# Patient Record
Sex: Female | Born: 1935 | Race: White | Hispanic: No | Smoking: Former smoker
Health system: Southern US, Community
[De-identification: ages and names within clinical notes are randomized; demographics above are authoritative.]

## PROBLEM LIST (undated history)

## (undated) DIAGNOSIS — R54 Age-related physical debility: Secondary | ICD-10-CM

## (undated) DIAGNOSIS — F0390 Unspecified dementia without behavioral disturbance: Secondary | ICD-10-CM

## (undated) DIAGNOSIS — I700 Atherosclerosis of aorta: Secondary | ICD-10-CM

## (undated) DIAGNOSIS — Z87898 Personal history of other specified conditions: Secondary | ICD-10-CM

## (undated) DIAGNOSIS — S065X9A Traumatic subdural hemorrhage with loss of consciousness of unspecified duration, initial encounter: Secondary | ICD-10-CM

## (undated) DIAGNOSIS — Z8659 Personal history of other mental and behavioral disorders: Secondary | ICD-10-CM

## (undated) DIAGNOSIS — I10 Essential (primary) hypertension: Secondary | ICD-10-CM

## (undated) DIAGNOSIS — R630 Anorexia: Secondary | ICD-10-CM

## (undated) DIAGNOSIS — E785 Hyperlipidemia, unspecified: Secondary | ICD-10-CM

## (undated) DIAGNOSIS — S0181XA Laceration without foreign body of other part of head, initial encounter: Secondary | ICD-10-CM

## (undated) DIAGNOSIS — N390 Urinary tract infection, site not specified: Secondary | ICD-10-CM

## (undated) DIAGNOSIS — S52502A Unspecified fracture of the lower end of left radius, initial encounter for closed fracture: Secondary | ICD-10-CM

## (undated) DIAGNOSIS — F419 Anxiety disorder, unspecified: Secondary | ICD-10-CM

## (undated) DIAGNOSIS — S022XXA Fracture of nasal bones, initial encounter for closed fracture: Secondary | ICD-10-CM

## (undated) DIAGNOSIS — S065XAA Traumatic subdural hemorrhage with loss of consciousness status unknown, initial encounter: Secondary | ICD-10-CM

## (undated) DIAGNOSIS — I639 Cerebral infarction, unspecified: Secondary | ICD-10-CM

## (undated) DIAGNOSIS — R410 Disorientation, unspecified: Secondary | ICD-10-CM

## (undated) HISTORY — DX: Fracture of nasal bones, initial encounter for closed fracture: S02.2XXA

## (undated) HISTORY — DX: Personal history of other specified conditions: Z87.898

## (undated) HISTORY — PX: HYSTEROTOMY: SHX1776

## (undated) HISTORY — DX: Hyperlipidemia, unspecified: E78.5

## (undated) HISTORY — DX: Laceration without foreign body of other part of head, initial encounter: S01.81XA

## (undated) HISTORY — DX: Unspecified fracture of the lower end of left radius, initial encounter for closed fracture: S52.502A

## (undated) HISTORY — DX: Essential (primary) hypertension: I10

## (undated) HISTORY — DX: Disorientation, unspecified: R41.0

## (undated) HISTORY — DX: Urinary tract infection, site not specified: N39.0

## (undated) HISTORY — PX: BREAST BIOPSY: SHX20

## (undated) HISTORY — DX: Anxiety disorder, unspecified: F41.9

## (undated) HISTORY — DX: Personal history of other mental and behavioral disorders: Z86.59

## (undated) HISTORY — DX: Anorexia: R63.0

## (undated) HISTORY — DX: Cerebral infarction, unspecified: I63.9

---

## 2015-03-08 ENCOUNTER — Ambulatory Visit (INDEPENDENT_AMBULATORY_CARE_PROVIDER_SITE_OTHER): Payer: Commercial Managed Care - HMO | Admitting: Family Medicine

## 2015-03-08 ENCOUNTER — Encounter: Payer: Self-pay | Admitting: Family Medicine

## 2015-03-08 VITALS — BP 160/98 | HR 101 | Temp 98.6°F | Ht 66.0 in | Wt 154.0 lb

## 2015-03-08 DIAGNOSIS — Z90710 Acquired absence of both cervix and uterus: Secondary | ICD-10-CM

## 2015-03-08 DIAGNOSIS — F1520 Other stimulant dependence, uncomplicated: Secondary | ICD-10-CM

## 2015-03-08 DIAGNOSIS — Z0000 Encounter for general adult medical examination without abnormal findings: Secondary | ICD-10-CM | POA: Insufficient documentation

## 2015-03-08 DIAGNOSIS — G459 Transient cerebral ischemic attack, unspecified: Secondary | ICD-10-CM

## 2015-03-08 LAB — POCT GLYCOSYLATED HEMOGLOBIN (HGB A1C): Hemoglobin A1C: 5.7
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
## 2015-03-08 NOTE — Patient Instructions (Signed)
It was a pleasure seeing you today in our clinic. Today we discussed establishment of care. Here is the treatment plan we have discussed and agreed upon together:  - I would like to see you back in clinic in 3-4 weeks. At that time we will go over your lab results taken today and will address any abnormalities we see. - I would like you to take your blood pressure at your local pharmacy (a public machine). If you can write the resulted blood pressure down for me at our next visit that would be very helpful!  - It was great meeting you today!

## 2015-03-08 NOTE — Assessment & Plan Note (Addendum)
Patient experiencing acute onset headaches after reducing the amount of caffeine consumption. Symptoms suggestive of caffeine dependence. Patient currently consumes a 2 L bottle of soda every 3 days. She also reports some infrequent consumption of coffee in the morning. At this time no further workup deemed necessary. I've counseled her on the safe practices of reducing soda intake. Less likely causes would be tension headaches, patient is not reporting any additional stress or depression at this time. Patient denies any aura or photosensitivity making migraine less likely.

## 2015-03-08 NOTE — Assessment & Plan Note (Addendum)
Patient has not received any care for a primary care provider in approximately 10 years. She is not currently on any medications at this time. Past medical history is minimal and no medical records have been supplied at this time.  Hx of TIA will need to be monitored, labs ordered for this reason: - BMP, CBC, A1c, lipid panel, TSH.

## 2015-03-09 LAB — CBC
HCT: 42.7 % (ref 36.0–46.0)
Hemoglobin: 14.3 g/dL (ref 12.0–15.0)
MCH: 28.4 pg (ref 26.0–34.0)
MCHC: 33.5 g/dL (ref 30.0–36.0)
MCV: 84.7 fL (ref 78.0–100.0)
MPV: 12 fL (ref 8.6–12.4)
PLATELETS: 287 10*3/uL (ref 150–400)
RBC: 5.04 MIL/uL (ref 3.87–5.11)
RDW: 14.8 % (ref 11.5–15.5)
WBC: 8.2 10*3/uL (ref 4.0–10.5)

## 2015-03-09 LAB — LIPID PANEL
CHOL/HDL RATIO: 4.8 ratio
Cholesterol: 239 mg/dL — ABNORMAL HIGH (ref 0–200)
HDL: 50 mg/dL (ref >=46)
LDL Cholesterol: 161 mg/dL — ABNORMAL HIGH (ref 0–99)
Triglycerides: 139 mg/dL (ref <150)
VLDL: 28 mg/dL (ref 0–40)

## 2015-03-09 LAB — TSH: TSH: 0.818 u[IU]/mL (ref 0.350–4.500)

## 2015-03-09 LAB — BASIC METABOLIC PANEL WITH GFR
BUN: 19 mg/dL (ref 6–23)
CO2: 23 meq/L (ref 19–32)
Calcium: 9.6 mg/dL (ref 8.4–10.5)
Chloride: 103 mEq/L (ref 96–112)
Creat: 0.49 mg/dL — ABNORMAL LOW (ref 0.50–1.10)
GFR, Est Non African American: >89 mL/min
GLUCOSE: 110 mg/dL — AB (ref 70–99)
Potassium: 4 mEq/L (ref 3.5–5.3)
Sodium: 138 mEq/L (ref 135–145)

## 2015-03-22 ENCOUNTER — Encounter: Payer: Self-pay | Admitting: Family Medicine

## 2015-03-29 ENCOUNTER — Encounter: Payer: Self-pay | Admitting: Family Medicine

## 2015-03-29 ENCOUNTER — Ambulatory Visit (INDEPENDENT_AMBULATORY_CARE_PROVIDER_SITE_OTHER): Payer: Commercial Managed Care - HMO | Admitting: Family Medicine

## 2015-03-29 VITALS — BP 130/100 | HR 118 | Temp 97.5°F | Ht 66.0 in | Wt 151.2 lb

## 2015-03-29 DIAGNOSIS — R030 Elevated blood-pressure reading, without diagnosis of hypertension: Secondary | ICD-10-CM | POA: Diagnosis not present

## 2015-03-29 DIAGNOSIS — E785 Hyperlipidemia, unspecified: Secondary | ICD-10-CM

## 2015-03-29 DIAGNOSIS — Z9149 Other personal history of psychological trauma, not elsewhere classified: Secondary | ICD-10-CM

## 2015-03-29 DIAGNOSIS — IMO0001 Reserved for inherently not codable concepts without codable children: Secondary | ICD-10-CM | POA: Insufficient documentation

## 2015-03-29 DIAGNOSIS — IMO0002 Reserved for concepts with insufficient information to code with codable children: Secondary | ICD-10-CM

## 2015-03-29 MED ORDER — ATORVASTATIN CALCIUM 40 MG PO TABS: 40.0000 mg | ORAL_TABLET | Freq: Every day | ORAL | Status: DC
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
## 2015-03-29 NOTE — Assessment & Plan Note (Signed)
A: labs came back showing HLD. Patient has a self-reported h/o TIA. She was NOT on any medication for this. So I had a very low threshold to start treatment from the start. LDL goal of <100 due to hx and 7763yr cardiac risk >20%  P: Starting Atorvastatin 40mg  QD. Went through the side-effects to look for and what to do if she experiences any.  - f/u in 1-2 months requested.  Next visit: patient has had very little screening performed  - discussion of colonoscopy and mammogram should be considered.

## 2015-03-29 NOTE — Assessment & Plan Note (Signed)
A: patient very untrusting of the medical community. BP 177/113 initially; repeat was 130/100; according to pt. She had a BP of 124/83 in [facility] just last week. Patient gets very easily flustered/nervous. While testing strength in the physical exam she did not understand my request for hand placement and became so flustered and jittery I had to stop the exam, and take a few breaths with her.   P: I have asked patient to take multiple BPs at her pharmacy for me over the next week or 2. The BP she is reporting makes me very uneasy about treating her for HTN due to risks for becoming hypotensive -- also, pt's Cr was normal. I would not expect her kidneys to function so well if her typical BP was close to the 177/113 value we obtained today.  - Patient is to obtain BPs while in a comfortable environment.  - I will be treating BP if these values come back in the hypertensive range. - Regardless of her nervous appearance on the outside I believe patient is starting to trust me a bit. She was much more calm than last visit. And she was much more calm at the end of this visit than the beginning. Upon leaving she thanked me and stated "It's nice to know I don't have another bone-headed doctor" -- It's the little wins, right?   Next visit: consider EKG, for both BL and due to hx of TIA

## 2015-03-29 NOTE — Patient Instructions (Addendum)
It was a pleasure seeing you today in our clinic. Today we discussed your labs and cholesterol. Here is the treatment plan we have discussed and agreed upon together:   - I have written for a prescription of atorvastatin (Lipitor) 40mg . This is to be taken once a day.  - If you begin to experience any symptoms of muscle aches, skin rash/swelling, abdominal pain, nausea, vomiting, or yellowing of the eyes or skin please do not hesitate to call our office and discontinue the medication.  - Also, please gather 2-3 measurements of your blood pressure while at your local pharmacy. Your blood pressure is currently very high in our office, but as we discussed, it seems to be very well controlled during your daily activities. Lets keep an eye on this. Please let me know what blood pressure values you obtain at these outside sources.  - At our next visit I would like to discuss further screening in the future
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
## 2015-03-29 NOTE — Assessment & Plan Note (Addendum)
Hx of TIA. Hasn't been on any medications for many years. NEEDS TO BE MONITORED. Will likely need to start a statin.   - CBC, Lipid panel, TSH

## 2015-04-29 ENCOUNTER — Other Ambulatory Visit: Payer: Self-pay | Admitting: Family Medicine

## 2015-04-29 MED ORDER — ATORVASTATIN CALCIUM 40 MG PO TABS: 40.0000 mg | ORAL_TABLET | Freq: Every day | ORAL | Status: DC

## 2015-05-09 ENCOUNTER — Ambulatory Visit (INDEPENDENT_AMBULATORY_CARE_PROVIDER_SITE_OTHER): Payer: Commercial Managed Care - HMO | Admitting: Family Medicine

## 2015-05-09 VITALS — BP 145/90 | HR 85 | Temp 98.2°F | Ht 66.0 in | Wt 147.1 lb

## 2015-05-09 DIAGNOSIS — R030 Elevated blood-pressure reading, without diagnosis of hypertension: Secondary | ICD-10-CM

## 2015-05-09 DIAGNOSIS — E785 Hyperlipidemia, unspecified: Secondary | ICD-10-CM

## 2015-05-09 DIAGNOSIS — IMO0001 Reserved for inherently not codable concepts without codable children: Secondary | ICD-10-CM

## 2015-05-09 MED ORDER — ATORVASTATIN CALCIUM 40 MG PO TABS: 40.0000 mg | ORAL_TABLET | Freq: Every day | ORAL | Status: DC

## 2015-05-09 NOTE — Patient Instructions (Signed)
It was a pleasure seeing you today in our clinic. Today we discussed your new medication and high blood pressure. Here is the treatment plan we have discussed and agreed upon together:  - Thank you so much for taking your blood pressure every week since our last visit. These pressures look wonderful! We will definitely NOT be starting any medications for this at this time.  - Continue taking the Atorvastatin. This will likely be a "forever" medication, which we discussed during our visit.

## 2015-05-13 ENCOUNTER — Encounter: Payer: Self-pay | Admitting: Family Medicine

## 2015-05-13 NOTE — Assessment & Plan Note (Signed)
Clinic HTN appears to be solely "white coat" in nature. BPs taken at her local pharmacy is promising. We may need her to recheck this at a different site, in case this machine is giving incorrect low reads due to improper calibration (also, the precision is a bit concerning -- you would think she would have at least one BP that differs by 5-6 systolic). As for now, I believe patient has no need to be on antihypertensives.

## 2015-05-13 NOTE — Assessment & Plan Note (Signed)
Patient tolerating Statin therapy well. No complaints at this time. Will need to reassess lipid panel in future. - continue current Atorvastatin 40mg 

## 2015-05-13 NOTE — Progress Notes (Signed)
   HPI  CC: med and HTN f/u  # HLD:  Patient recently placed on statin  Patient reports tolerating this well >> takes everyday around breakfast  Has not had any adverse effects noticed by patient.  Asking about duration of medication, I informed her this would likely be lifelong.   Happy with how it is going ROS: denies HA, CP, dizziness, SOB, myalgias, fatigue, SOB, cramping, weakness, paresthesias.  # HTN, whitecoat  Patient also in today to recheck BP  Patient was asked to take BP at local pharmacy at least once a week between this visit and the previous.   Patient complied: 123/80 (4/27), 124/80, 123/82, 123/82, 124/82 (5/22)  BPs are MUCH better than the BPs we are getting here in the clinic.  ROS: no HA, dizziness, SOB, fatigue, CP, abdom pain, seizures, syncome  Review of Systems   See HPI for ROS. All other systems reviewed and are negative.  Past medical history and social history reviewed and updated in the EMR as appropriate.  Objective: BP 145/90 mmHg  Pulse 85  Temp(Src) 98.2 F (36.8 C) (Oral)  Ht 5\' 6"  (1.676 m)  Wt 147 lb 1.6 oz (66.724 kg)  BMI 23.75 kg/m2 Gen: NAD, alert, cooperative, and pleasant. Slightly anxious appearing HEENT: NCAT, EOMI, MMM CV: RRR, no murmur Resp: CTAB, no wheezes, non-labored Ext: No edema, warm Neuro: Alert and oriented, Speech clear, No gross deficits  Assessment and plan:  Hyperlipidemia LDL goal <100 Patient tolerating Statin therapy well. No complaints at this time. Will need to reassess lipid panel in future. - continue current Atorvastatin 40mg    White coat hypertension Clinic HTN appears to be solely "white coat" in nature. BPs taken at her local pharmacy is promising. We may need her to recheck this at a different site, in case this machine is giving incorrect low reads due to improper calibration (also, the precision is a bit concerning -- you would think she would have at least one BP that differs by 5-6  systolic). As for now, I believe patient has no need to be on antihypertensives.    Meds ordered this encounter  Medications  . atorvastatin (LIPITOR) 40 MG tablet    Sig: Take 1 tablet (40 mg total) by mouth daily.    Dispense:  90 tablet    Refill:  3     Kathee DeltonIan D Esker Dever, MD,MS,  PGY1 05/13/2015 6:59 PM

## 2015-10-06 ENCOUNTER — Encounter: Payer: Self-pay | Admitting: Family Medicine

## 2015-10-06 ENCOUNTER — Ambulatory Visit (INDEPENDENT_AMBULATORY_CARE_PROVIDER_SITE_OTHER): Payer: Commercial Managed Care - HMO | Admitting: Family Medicine

## 2015-10-06 VITALS — BP 140/92 | HR 78 | Temp 98.1°F | Ht 66.0 in | Wt 140.0 lb

## 2015-10-06 DIAGNOSIS — E785 Hyperlipidemia, unspecified: Secondary | ICD-10-CM

## 2015-10-06 MED ORDER — ASPIRIN EC 81 MG PO TBEC: 81.0000 mg | DELAYED_RELEASE_TABLET | Freq: Every day | ORAL | Status: DC

## 2015-10-06 NOTE — Assessment & Plan Note (Signed)
Patient is currently doing well on her current medication regimen. She did ask however if we would be able to decrease the dosing of her atorvastatin, not because of any side effects but because of curiosity. I informed her that due to her history of a TIA it would be ideal that she stay on statin therapy of at least this amount. We discussed the possibility of starting a regular baby aspirin to her medication regimen. She was willing to initiate this. - Continue atorvastatin 40 mg daily - Initiate Aspirin 81 mg daily. - Follow-up in 5-6 months for routine labs and check-up.

## 2015-10-06 NOTE — Progress Notes (Signed)
   HPI  CC: f/u Patient is here for a follow-up on her medication. She states that she has been very compliant with her current regimen of atorvastatin. She denies any side effects of muscle aches fatigue weakness cramping upset stomach nausea vomiting or diarrhea. Overall she states that she feels well and has no other complaints.  ROS: No headache or blurred vision insomnia fatigue shortness of breath chest pain or paresthesias.  Objective: BP 140/92 mmHg  Pulse 78  Temp(Src) 98.1 F (36.7 C) (Oral)  Ht 5\' 6"  (1.676 m)  Wt 140 lb (63.504 kg)  BMI 22.61 kg/m2 Gen: NAD, alert, cooperative, and pleasant. CV: RRR, no murmur Resp: CTAB, no wheezes, non-labored Neuro: Alert and oriented, Speech clear, No gross deficits  Assessment and plan:  Hyperlipidemia LDL goal <100 Patient is currently doing well on her current medication regimen. She did ask however if we would be able to decrease the dosing of her atorvastatin, not because of any side effects but because of curiosity. I informed her that due to her history of a TIA it would be ideal that she stay on statin therapy of at least this amount. We discussed the possibility of starting a regular baby aspirin to her medication regimen. She was willing to initiate this. - Continue atorvastatin 40 mg daily - Initiate Aspirin 81 mg daily. - Follow-up in 5-6 months for routine labs and check-up.   Meds ordered this encounter  Medications  . aspirin EC 81 MG tablet    Sig: Take 1 tablet (81 mg total) by mouth daily.    Dispense:  30 tablet    Refill:  11     Kathee DeltonIan D Nnenna Meador, MD,MS,  PGY2 10/06/2015 4:54 PM

## 2015-10-06 NOTE — Patient Instructions (Signed)
It was a pleasure seeing you today in our clinic. Today we discussed your medications. Here is the treatment plan we have discussed and agreed upon together:   - Keep up the great work. - Continue taking the atorvastatin once a day. - Do not hesitate to give our office a call if you have any questions or concerns. - I would like to see you back in about 5-6 months (end of March/early April) for your annual checkup and labs. - Have a great holiday season!

## 2015-10-11 ENCOUNTER — Telehealth: Payer: Self-pay | Admitting: Family Medicine

## 2015-10-11 NOTE — Telephone Encounter (Signed)
Spoke with patient, she states she feels the lipitor is making her more aggressive and shakey/nervous and she has stopped taking it. States she was going to bring this up to MD at last office visit but lost her nerve. She states she is still taking the aspirin. Would to speak with MD.

## 2015-10-11 NOTE — Telephone Encounter (Signed)
Was able to talk to patient over the phone. Patient feels as though she is experienced and side effects of anxiety and aggressiveness from her Lipitor. She decided to stop taking this medicine. At this time I think that patient's anxiety is playing a role, and her baseline fear of the healthcare community does not help. I discussed with her the possibility of starting an SSRI to help with his anxiety. Patient would like to speak with me in person. Appointment has been set up for Friday.  Patient would like to stop taking Lipitor and has asked if she is to go without taking this medicine until Friday. I informed her that although I prescribed this medication if she does not want to take this medication than I cannot force her to do so.

## 2015-10-11 NOTE — Telephone Encounter (Signed)
Pt called and would like to speak to Dr. Wende MottMcKeag about her visit last week. jw

## 2015-10-14 ENCOUNTER — Encounter: Payer: Self-pay | Admitting: Family Medicine

## 2015-10-14 ENCOUNTER — Ambulatory Visit (INDEPENDENT_AMBULATORY_CARE_PROVIDER_SITE_OTHER): Payer: Commercial Managed Care - HMO | Admitting: Family Medicine

## 2015-10-14 VITALS — BP 176/94 | HR 109 | Temp 98.6°F | Ht 66.0 in | Wt 141.9 lb

## 2015-10-14 DIAGNOSIS — F419 Anxiety disorder, unspecified: Secondary | ICD-10-CM

## 2015-10-14 DIAGNOSIS — E785 Hyperlipidemia, unspecified: Secondary | ICD-10-CM | POA: Diagnosis not present

## 2015-10-14 MED ORDER — CITALOPRAM HYDROBROMIDE 20 MG PO TABS: ORAL_TABLET | ORAL | Status: DC

## 2015-10-14 NOTE — Progress Notes (Signed)
   HPI  CC: medications and anxiety Patient is here today due to some issues with her medications. She states that she has been feeling increasingly panicky since initiating her atorvastatin. She recognizes that she had been extremely irritable with her neighbor which is unlike her. She also had a extremely panicked feeling while trying to escalate the curb at her grocery store. She has discontinued the atorvastatin at this time.  We have discussed initiating SSRI treatment at this time. Patient is very unsure resting of most/all medical management and medical care workers. We discussed some of the pros and cons of starting a treatment like this.   ROS: Denies fever chills nausea vomiting diarrhea muscle aches and joint pains abdominal pain chest pain shortness of breath. Endorses panicked feeling jitteriness irritability and anxiety  Objective: BP 176/94 mmHg  Pulse 109  Temp(Src) 98.6 F (37 C) (Oral)  Ht 5\' 6"  (1.676 m)  Wt 141 lb 14.4 oz (64.365 kg)  BMI 22.91 kg/m2 Gen: NAD, alert, anxious,  CV: RRR Resp: CTAB, no wheezes, non-labored Abd: SNTND Neuro: Alert and oriented, Speech clear, No gross deficits, anxious, broke into tears at one point due to her anxiety.  Assessment and plan:  Hyperlipidemia LDL goal <100 Patient feels as though the statin she is currently taking is causing significant anxiety. She has stopped taking this medicine b/c of this. To my knowledge Anxiety is not a common SA of this class of drugs. This is unlikely that this is a true SA from this statin.  - I will readdress this decision at a later date.  Anxiety Patient has shown a significant amount of anxiety toward the healthcare community in the past. This was one of the first times she has described this type of anxiety effecting her life outside of our clinic. Just from some of the conversations we have had in the past I feel that it is likely that she is typically fairly anxious and on edge outside our  clinic. Patient was relatively receptive to initiating an SSRI today as long as its not "a habit forming drug". - Celexa 20mg  x7 days, 40mg  after that - f/u in 4 weeks   Meds ordered this encounter  Medications  . citalopram (CELEXA) 20 MG tablet    Sig: Take one tablet (20mg ) every day for the first 7 days. Then take 2 tablets (40mg ) every day after that.    Dispense:  53 tablet    Refill:  0     Kathee DeltonIan D Lindsee Labarre, MD,MS,  PGY2 10/16/2015 7:12 PM

## 2015-10-14 NOTE — Patient Instructions (Signed)
It was a pleasure seeing you today in our clinic. Today we discussed her medications and anxiety. Here is the treatment plan we have discussed and agreed upon together:   - I have started you on citalopram. Take 1 tablet once a day for the first week. Then take 2 tablets once a day every day after that. - I like to see you back in one month to go over your response to this medication.

## 2015-10-16 DIAGNOSIS — F419 Anxiety disorder, unspecified: Secondary | ICD-10-CM | POA: Insufficient documentation

## 2015-10-16 NOTE — Assessment & Plan Note (Signed)
Patient has shown a significant amount of anxiety toward the healthcare community in the past. This was one of the first times she has described this type of anxiety effecting her life outside of our clinic. Just from some of the conversations we have had in the past I feel that it is likely that she is typically fairly anxious and on edge outside our clinic. Patient was relatively receptive to initiating an SSRI today as long as its not "a habit forming drug". - Celexa 20mg  x7 days, 40mg  after that - f/u in 4 weeks

## 2015-10-16 NOTE — Assessment & Plan Note (Signed)
Patient feels as though the statin she is currently taking is causing significant anxiety. She has stopped taking this medicine b/c of this. To my knowledge Anxiety is not a common SA of this class of drugs. This is unlikely that this is a true SA from this statin.  - I will readdress this decision at a later date.

## 2015-10-24 ENCOUNTER — Telehealth: Payer: Self-pay | Admitting: Family Medicine

## 2015-10-24 NOTE — Telephone Encounter (Signed)
Pt called and would like to speak to her doctor about her Celexa. Olivia Jacobsonjw

## 2015-10-25 NOTE — Telephone Encounter (Signed)
Return call to patient regarding reaction to Celexa.  Patient stated she took a dose this morning and felt short of breathe. Advise patient to stop taking until she hear a response from her doctor or nurse.  Patient denied any other symptoms.  Will forward to PCP.  Clovis PuMartin, Alashia Brownfield L, RN

## 2015-10-25 NOTE — Telephone Encounter (Signed)
Pt called because she thinks that she is having side effects from the Celexa. She is having trouble catching her breath. She is not going to take any more of the medication until she speaks to the doctor. Please call ASAP. jw

## 2015-10-26 NOTE — Telephone Encounter (Signed)
Pt wants to talk to dr and is upset that he hasnt called her back yet

## 2015-10-26 NOTE — Telephone Encounter (Signed)
Patient states she has not tolerated the 40mg  dosing well. Reports some "breathing problems" when taking it. I asked her to describe her breathing issues and she stated that it "probably wasn't the medication" but instead she got "worked up" and that was what caused it. I asked if thiscould be described as a panic attack and she said yes. She denies any SOB, cough, or wheeze. SOB only lasted until she calmed down from this panicked like state. She asked if 20mg  (one tablet) would be ok to take. I told her it was. She seemed relieved. She had no further questions.

## 2015-10-31 ENCOUNTER — Telehealth: Payer: Self-pay | Admitting: Family Medicine

## 2015-10-31 NOTE — Telephone Encounter (Signed)
FYI to PCP

## 2015-10-31 NOTE — Telephone Encounter (Signed)
Pt called and said that they are getting along fine with just 1 pill of the anxiety medication. jw

## 2015-11-16 ENCOUNTER — Other Ambulatory Visit: Payer: Self-pay | Admitting: Family Medicine

## 2015-11-16 ENCOUNTER — Telehealth: Payer: Self-pay | Admitting: Family Medicine

## 2015-11-16 MED ORDER — CITALOPRAM HYDROBROMIDE 20 MG PO TABS: 20.0000 mg | ORAL_TABLET | Freq: Every day | ORAL | Status: DC

## 2015-11-16 NOTE — Telephone Encounter (Signed)
Patient informed, expressed understanding. 

## 2015-11-16 NOTE — Telephone Encounter (Signed)
Pt called and needs a refill on her Lipitor and Celexa called in. jw

## 2015-11-16 NOTE — Telephone Encounter (Signed)
Celexa refilled. Lipitor SHOULD still has refills available.

## 2016-02-09 ENCOUNTER — Telehealth: Payer: Self-pay | Admitting: *Deleted

## 2016-02-09 NOTE — Telephone Encounter (Signed)
Called patient to offer flu vaccine.  Patient declined, stating she doesn't do any "voluntary innoculations." DUCATTE, Nickola Major, RN

## 2016-03-01 ENCOUNTER — Ambulatory Visit (INDEPENDENT_AMBULATORY_CARE_PROVIDER_SITE_OTHER): Payer: Commercial Managed Care - HMO | Admitting: Family Medicine

## 2016-03-01 ENCOUNTER — Encounter: Payer: Self-pay | Admitting: Family Medicine

## 2016-03-01 VITALS — BP 156/89 | HR 118 | Temp 98.1°F | Ht 66.0 in | Wt 128.0 lb

## 2016-03-01 DIAGNOSIS — R030 Elevated blood-pressure reading, without diagnosis of hypertension: Secondary | ICD-10-CM | POA: Diagnosis not present

## 2016-03-01 DIAGNOSIS — Z23 Encounter for immunization: Secondary | ICD-10-CM

## 2016-03-01 DIAGNOSIS — F419 Anxiety disorder, unspecified: Secondary | ICD-10-CM | POA: Diagnosis not present

## 2016-03-01 DIAGNOSIS — IMO0001 Reserved for inherently not codable concepts without codable children: Secondary | ICD-10-CM

## 2016-03-01 NOTE — Patient Instructions (Signed)
It was a pleasure seeing you today in our clinic. Today we discussed your medications and anxiety. Here is the treatment plan we have discussed and agreed upon together:   - I feel very good about where you are at this time with your medications. - Continue taking the atorvastatin for your cholesterol - Continue taking your Celexa for your anxiety - Continue taking your aspirin for your heart health - I would like to see you back in 6-9 months or sooner if necessary.
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
## 2016-03-01 NOTE — Assessment & Plan Note (Signed)
Stable: Patient continues to have hypertensive reads in the office. In the past patient was asked to bring in blood pressure values from her local pharmacy which she feels comfortable at. Those values were normotensive. Patient also has 1 previous normotensive reading which was taken at the end of a previous visit. - No medications deemed necessary at this time. We'll continue to monitor

## 2016-03-01 NOTE — Assessment & Plan Note (Addendum)
Improving: Patient endorses significant improvement on new medication regimen. She endorses her desire to continue with this regimen at this time. - No changes to her medication regimen at this time. - Today I offered our psychiatric services in this clinic. Patient was adamant that she was not interested in this at this time. However she stated her appreciation when informed that the services will always be available while she is a patient in this clinic. However, for now I do not believe patient has any plans to utilize this resource at this time.

## 2016-04-16 ENCOUNTER — Other Ambulatory Visit: Payer: Self-pay | Admitting: Family Medicine

## 2016-04-16 ENCOUNTER — Telehealth: Payer: Self-pay | Admitting: Family Medicine

## 2016-04-16 MED ORDER — CITALOPRAM HYDROBROMIDE 20 MG PO TABS: 20.0000 mg | ORAL_TABLET | Freq: Every day | ORAL | Status: DC

## 2016-04-16 NOTE — Telephone Encounter (Signed)
Called patient. Discussed her medications. She is very pleased w/ her current medication regimen. She does not have an appointment scheduled w/ me in the future. I asked that she make one at her convenience as she is due for some labs to be drawn.   She had no further questions.  Kathee DeltonIan D McKeag, MD,MS,  PGY2 04/16/2016 3:25 PM

## 2016-04-16 NOTE — Telephone Encounter (Signed)
Pt called because she needs a refill on her Celexa, and she had some additional questions for the doctor about the medication. jw

## 2016-06-01 ENCOUNTER — Ambulatory Visit (INDEPENDENT_AMBULATORY_CARE_PROVIDER_SITE_OTHER): Payer: Commercial Managed Care - HMO | Admitting: Family Medicine

## 2016-06-01 VITALS — BP 157/69 | HR 70 | Temp 98.1°F | Ht 66.0 in | Wt 130.6 lb

## 2016-06-01 DIAGNOSIS — R29818 Other symptoms and signs involving the nervous system: Secondary | ICD-10-CM | POA: Diagnosis not present

## 2016-06-01 DIAGNOSIS — Z79899 Other long term (current) drug therapy: Secondary | ICD-10-CM | POA: Diagnosis not present

## 2016-06-01 DIAGNOSIS — G252 Other specified forms of tremor: Secondary | ICD-10-CM

## 2016-06-01 DIAGNOSIS — E785 Hyperlipidemia, unspecified: Secondary | ICD-10-CM

## 2016-06-01 DIAGNOSIS — F4310 Post-traumatic stress disorder, unspecified: Secondary | ICD-10-CM
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
## 2016-06-01 NOTE — Assessment & Plan Note (Signed)
Patient appears to have some fairly significant neurological deficits on exam. Initial impression was that these seem to be due to her overwhelming anxiety. However, now that she is relatively comfortable in our office it is very much apparent that these deficits are real and have likely been present since her reported TIA she experienced in [place] prior to her moving to [place]. - My concern is with cerebellar deficits as well as possible development of multifocal vascular dementia. - Patient adamantly refuses to begin any additional medication therapy. She also refuses to see any specialists including neurology physical therapy or occupational therapy. - I will monitor this throughout our office visits. - Patient agrees to continue taking her daily aspirin, atorvastatin, and Celexa.

## 2016-06-01 NOTE — Assessment & Plan Note (Signed)
Patient is here complaining of a tremor which has been significantly affecting her handwriting. Physical exam yielded a baseline intention tremor. Etiology likely secondary to patient's history of stroke and age. There may be some anxiety component to this as well. Patient would like to know if there is anything she can do but was very adamant that she did not want any new medications and did not want to see any specialists or therapists for this issue - Because of patient's wishes and her ability to make her own decisions we will simply watch and monitor these symptoms. - Patient refused any medical management. - Patient refused any referral to neurology or occupational therapy.

## 2016-06-01 NOTE — Patient Instructions (Signed)
It was a pleasure seeing you today in our clinic. Today we discussed your tremor and medications. Here is the treatment plan we have discussed and agreed upon together:   - I think at this time you would benefit most by being seen by a neurologist. Unfortunately without your approval I cannot place this referral. I would like for you to consider this option in the future now be happy to make this referral at that time. - I believe your tremor likely has multiple causes. One would be from your previous stroke and the other from your age. We are currently doing what we can to reduce the risk of this getting any worse but without your approval to begin new medications or to see an occupational therapist there isn't too much more we can do. - We will draw labs today to make sure you are responding well to your medications. - I would like to see you back in 6 months.

## 2016-06-02 ENCOUNTER — Encounter: Payer: Self-pay | Admitting: Family Medicine

## 2016-06-02 LAB — BASIC METABOLIC PANEL WITH GFR
BUN: 29 mg/dL — AB (ref 7–25)
CO2: 22 mmol/L (ref 20–31)
Calcium: 9.5 mg/dL (ref 8.6–10.4)
Chloride: 101 mmol/L (ref 98–110)
Creat: 0.64 mg/dL (ref 0.60–0.93)
GFR, Est Non African American: 86 mL/min (ref >=60)
GLUCOSE: 105 mg/dL — AB (ref 65–99)
POTASSIUM: 4.1 mmol/L (ref 3.5–5.3)
Sodium: 139 mmol/L (ref 135–146)

## 2016-06-02 LAB — LIPID PANEL
Cholesterol: 158 mg/dL (ref 125–200)
HDL: 78 mg/dL (ref >=46)
LDL Cholesterol: 57 mg/dL (ref <130)
TRIGLYCERIDES: 117 mg/dL (ref <150)
Total CHOL/HDL Ratio: 2 Ratio (ref <=5.0)
VLDL: 23 mg/dL (ref <30)

## 2016-06-11 ENCOUNTER — Telehealth: Payer: Self-pay | Admitting: *Deleted

## 2016-06-11 NOTE — Telephone Encounter (Signed)
Patient requesting to speak with Dr. Wende MottMcKeag about the the possibility of being referred to a neurologist.

## 2016-06-12 ENCOUNTER — Other Ambulatory Visit: Payer: Self-pay | Admitting: Family Medicine

## 2016-06-12 DIAGNOSIS — I6930 Unspecified sequelae of cerebral infarction: Secondary | ICD-10-CM

## 2016-06-12 NOTE — Telephone Encounter (Signed)
Referral sent 

## 2016-07-31 ENCOUNTER — Other Ambulatory Visit: Payer: Self-pay | Admitting: Family Medicine

## 2016-08-09 ENCOUNTER — Ambulatory Visit (INDEPENDENT_AMBULATORY_CARE_PROVIDER_SITE_OTHER): Payer: Commercial Managed Care - HMO | Admitting: Neurology

## 2016-08-09 ENCOUNTER — Encounter: Payer: Self-pay | Admitting: Neurology

## 2016-08-09 VITALS — BP 162/88 | HR 93 | Ht 65.0 in | Wt 130.0 lb

## 2016-08-09 DIAGNOSIS — F444 Conversion disorder with motor symptom or deficit: Secondary | ICD-10-CM | POA: Diagnosis not present

## 2016-08-09 DIAGNOSIS — F4310 Post-traumatic stress disorder, unspecified: Secondary | ICD-10-CM | POA: Diagnosis not present

## 2016-08-09 DIAGNOSIS — R413 Other amnesia: Secondary | ICD-10-CM

## 2016-08-09 DIAGNOSIS — R030 Elevated blood-pressure reading, without diagnosis of hypertension: Secondary | ICD-10-CM

## 2016-08-09 DIAGNOSIS — IMO0001 Reserved for inherently not codable concepts without codable children: Secondary | ICD-10-CM
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
## 2016-08-09 NOTE — Patient Instructions (Signed)
I think all of your symptoms are related to stress and anxiety from post traumatic stress disorder.  However, I recommend scheduling a neuropsychological evaluation to make sure there isn't anything else that is missed.  Follow up afterwards.

## 2016-08-17 ENCOUNTER — Telehealth: Payer: Self-pay | Admitting: Family Medicine

## 2016-08-17 NOTE — Telephone Encounter (Signed)
Done

## 2016-08-17 NOTE — Telephone Encounter (Signed)
Needs [employer] referral for [facility] for her appt on Sept 7

## 2016-08-23 ENCOUNTER — Ambulatory Visit (INDEPENDENT_AMBULATORY_CARE_PROVIDER_SITE_OTHER): Payer: Commercial Managed Care - HMO | Admitting: Psychology

## 2016-08-23 ENCOUNTER — Encounter: Payer: Self-pay | Admitting: Psychology

## 2016-08-23 DIAGNOSIS — R413 Other amnesia: Secondary | ICD-10-CM | POA: Diagnosis not present

## 2016-08-23 DIAGNOSIS — F4310 Post-traumatic stress disorder, unspecified: Secondary | ICD-10-CM
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
## 2016-08-29 ENCOUNTER — Ambulatory Visit (INDEPENDENT_AMBULATORY_CARE_PROVIDER_SITE_OTHER): Payer: Commercial Managed Care - HMO | Admitting: Psychology

## 2016-08-29 DIAGNOSIS — R413 Other amnesia: Secondary | ICD-10-CM

## 2016-08-29 NOTE — Progress Notes (Signed)
   Neuropsychology Note  Olivia EndsMarion Alma Robertson returned today for 2 hours of neuropsychological testing with technician, Olivia Robertson, BS, under the supervision of Dr. Elvis CoilMaryBeth Bailar. The patient presented as extremely anxious during the testing session, consistent with her behavior at previous appointments in our office. Testing was completed in a different room than usual (i.e., larger room with a window) at the patient's request. Rest breaks were taken as needed to improve patient's comfort level. Olivia Robertson will return within 2 weeks for a feedback session with Dr. Alinda DoomsBailar at which time her test performances, clinical impressions and treatment recommendations will be reviewed in detail. The patient understands she can contact our office should she require our assistance before this time.  Full report to follow.

## 2016-09-04 ENCOUNTER — Encounter: Payer: Self-pay | Admitting: *Deleted

## 2016-09-04 ENCOUNTER — Ambulatory Visit (INDEPENDENT_AMBULATORY_CARE_PROVIDER_SITE_OTHER): Payer: Commercial Managed Care - HMO | Admitting: *Deleted

## 2016-09-04 VITALS — BP 141/66 | HR 78 | Temp 98.0°F | Ht 66.0 in | Wt 127.4 lb

## 2016-09-04 DIAGNOSIS — Z0000 Encounter for general adult medical examination without abnormal findings: Secondary | ICD-10-CM | POA: Diagnosis not present

## 2016-09-04 NOTE — Progress Notes (Signed)
Subjective:   Olivia Robertson is a 80 y.o. female who presents for an Initial Medicare Annual Wellness Visit.   Cardiac Risk Factors include: advanced age (>58men, >52 women);dyslipidemia;smoking/ tobacco exposure     Objective:    Today's Vitals   09/04/16 1128  BP: (!) 141/66  Pulse: 78  Temp: 98 F (36.7 C)  TempSrc: Oral  SpO2: 99%  Weight: 127 lb 6.4 oz (57.8 kg)  Height: 5\' 6"  (1.676 m)   Body mass index is 20.56 kg/m.   Current Medications (verified) Outpatient Encounter Prescriptions as of 09/04/2016  Medication Sig  . aspirin EC 81 MG tablet Take 1 tablet (81 mg total) by mouth daily.  Marland Kitchen atorvastatin (LIPITOR) 40 MG tablet take 1 tablet by mouth once daily  . cholecalciferol (VITAMIN D) 1000 units tablet Take 1,000 Units by mouth daily.  . citalopram (CELEXA) 20 MG tablet Take 1 tablet (20 mg total) by mouth daily.  . Omega-3 1000 MG CAPS Take 1 capsule by mouth 1 day or 1 dose.  . vitamin B-12 (CYANOCOBALAMIN) 100 MCG tablet Take 100 mcg by mouth daily.   No facility-administered encounter medications on file as of 09/04/2016.     Allergies (verified) Penicillins   History: Past Medical History:  Diagnosis Date  . Anxiety   . Hyperlipidemia   . Stroke [facility])    Past Surgical History:  Procedure Laterality Date  . BREAST BIOPSY Bilateral   . HYSTEROTOMY     Family History  Problem Relation Age of Onset  . Adopted: Yes  . Alcohol abuse Mother    Social History   Occupational History  . Not on file.   Social History Main Topics  . Smoking status: Former Smoker    Packs/day: 1.50    Years: 35.00    Quit date: 03/07/1990  . Smokeless tobacco: Never Used  . Alcohol use No  . Drug use: No  . Sexual activity: No    Tobacco Counseling Patient is former smoker with no plans to restart  Activities of Daily Living In your present state of health, do you have any difficulty performing the following activities: 09/04/2016  Hearing? N    Vision? Y  Difficulty concentrating or making decisions? Y  Walking or climbing stairs? Y  Dressing or bathing? Y  Doing errands, shopping? Y  Preparing Food and eating ? Y  Using the Toilet? N  In the past six months, have you accidently leaked urine? N  Do you have problems with loss of bowel control? N  Managing your Medications? N  Managing your Finances? N  Housekeeping or managing your Housekeeping? Y  Some recent data might be hidden  Home Safety:  My home has a working smoke alarm:  Yes X 1           My home throw rugs have been fastened down to the floor or removed:  Fastened down I have non-slip mats in the bathtub and shower:  Yes         All my home's stairs have railings or bannisters: first floor apt. No outside steps.         My home's floors, stairs and hallways are free from clutter, wires and cords:  Yes        Immunizations and Health Maintenance Immunization History  Administered Date(s) Administered  . Tdap 03/01/2016   Health Maintenance Due  Topic Date Due  . ZOSTAVAX  07/09/1998  . DEXA SCAN  07/10/2003  .  PNA vac Low Risk Adult (1 of 2 - PCV13) 07/10/2003  . INFLUENZA VACCINE  07/17/2016  Patient refuses influenza, prevnar and zostavax vaccinations. Will think about having dexa scan done; literature provided  Patient Care Team: Kathee DeltonIan D McKeag, MD as PCP - General (Family Medicine) Drema DallasAdam R Jaffe, DO as Consulting Physician (Neurology)  Indicate any recent Medical Services you may have received from other than [facility] providers in the past year (date may be approximate).     Assessment:   This is a routine wellness examination for [facility].   Hearing/Vision screen Patient states she had hearing exam done recently.  Dietary issues and exercise activities discussed: Current Exercise Habits: Home exercise routine, Type of exercise: walking, Time (Minutes): 30, Frequency (Times/Week): 7, Weekly Exercise (Minutes/Week): 210, Intensity: Moderate, Exercise  limited by: psychological condition(s)  Goals    . Decrease anxiety (pt-stated)     Behavioral Health consultant, Boneta LucksJenny, in to speak with patient at end of visit.  Depression Screen PHQ 2/9 Scores 09/04/2016 03/01/2016 10/14/2015 10/06/2015 03/29/2015 03/08/2015  PHQ - 2 Score 3 0 0 0 0 0  PHQ- 9 Score 5 - - - - -    Fall Risk Fall Risk  09/04/2016 08/09/2016 03/01/2016 10/06/2015 03/08/2015  Falls in the past year? No No No No No    Cognitive Function: MMSE - Mini Mental State Exam 08/09/2016  Orientation to time 4  Orientation to Place 5  Registration 3  Attention/ Calculation 0  Recall 2  Language- name 2 objects 2  Language- repeat 1  Language- follow 3 step command 2  Language- read & follow direction 1  Write a sentence 0  Copy design 0  Total score 20   Mini-Cog Failed with score 1/5. Patient was unable to draw clock due to her anxiety.  TUG Test:  Unable to assess due to patient's anxiety  Screening Tests Health Maintenance  Topic Date Due  . ZOSTAVAX  07/09/1998  . DEXA SCAN  07/10/2003  . PNA vac Low Risk Adult (1 of 2 - PCV13) 07/10/2003  . INFLUENZA VACCINE  07/17/2016  . TETANUS/TDAP  03/01/2026      Plan:     During the course of the visit, Olivia LimerickMarion was educated and counseled about the following appropriate screening and preventive services:   Vaccines to include Pneumoccal, Influenza, Td, Zostavax  Colorectal cancer screening  Bone density screening  Diabetes screening  Mammography/PAP  Nutrition counseling  Patient Instructions (the written plan) were given to the patient.    Fredderick SeveranceDUCATTE, Lucilia Yanni L, RN   09/04/2016

## 2016-09-04 NOTE — Patient Instructions (Signed)
Bone Densitometry Bone densitometry is an imaging test that uses a special X-ray to measure the amount of calcium and other minerals in your bones (bone density). This test is also known as a bone mineral density test or dual-energy X-ray absorptiometry (DXA). The test can measure bone density at your hip and your spine. It is similar to having a regular X-ray. You may have this test to:  Diagnose a condition that causes weak or thin bones (osteoporosis).  Predict your risk of a broken bone (fracture).  Determine how well osteoporosis treatment is working. LET [facility] CARE PROVIDER KNOW ABOUT:  Any allergies you have.  All medicines you are taking, including vitamins, herbs, eye drops, creams, and over-the-counter medicines.  Previous problems you or members of your family have had with the use of anesthetics.  Any blood disorders you have.  Previous surgeries you have had.  Medical conditions you have.  Possibility of pregnancy.  Any other medical test you had within the previous 14 days that used contrast material. RISKS AND COMPLICATIONS Generally, this is a safe procedure. However, problems can occur and may include the following:  This test exposes you to a very small amount of radiation.  The risks of radiation exposure may be greater to unborn children. BEFORE THE PROCEDURE  Do not take any calcium supplements for 24 hours before having the test. You can otherwise eat and drink what you usually do.  Take off all metal jewelry, eyeglasses, dental appliances, and any other metal objects. PROCEDURE  You may lie on an exam table. There will be an X-ray generator below you and an imaging device above you.  Other devices, such as boxes or braces, may be used to position your body properly for the scan.  You will need to lie still while the machine slowly scans your body.  The images will show up on a computer monitor. AFTER THE PROCEDURE You may need more testing  at a later time.   This information is not intended to replace advice given to you by your health care provider. Make sure you discuss any questions you have with your health care provider.   Document Released: 12/25/2004 Document Revised: 12/24/2014 Document Reviewed: 05/13/2014 Elsevier Interactive Patient Education 2016 [place] in the Home  Falls can cause injuries. They can happen to people of all ages. There are many things you can do to make your home safe and to help prevent falls.  WHAT CAN I DO ON THE OUTSIDE OF MY HOME?  Regularly fix the edges of walkways and driveways and fix any cracks.  Remove anything that might make you trip as you walk through a door, such as a raised step or threshold.  Trim any bushes or trees on the path to your home.  Use bright outdoor lighting.  Clear any walking paths of anything that might make someone trip, such as rocks or tools.  Regularly check to see if handrails are loose or broken. Make sure that both sides of any steps have handrails.  Any raised decks and porches should have guardrails on the edges.  Have any leaves, snow, or ice cleared regularly.  Use sand or salt on walking paths during winter.  Clean up any spills in your garage right away. This includes oil or grease spills. WHAT CAN I DO IN THE BATHROOM?   Use night lights.  Install grab bars by the toilet and in the tub and shower. Do not use  towel bars as grab bars.  Use non-skid mats or decals in the tub or shower.  If you need to sit down in the shower, use a plastic, non-slip stool.  Keep the floor dry. Clean up any water that spills on the floor as soon as it happens.  Remove soap buildup in the tub or shower regularly.  Attach bath mats securely with double-sided non-slip rug tape.  Do not have throw rugs and other things on the floor that can make you trip. WHAT CAN I DO IN THE BEDROOM?  Use night lights.  Make sure that you  have a light by your bed that is easy to reach.  Do not use any sheets or blankets that are too big for your bed. They should not hang down onto the floor.  Have a firm chair that has side arms. You can use this for support while you get dressed.  Do not have throw rugs and other things on the floor that can make you trip. WHAT CAN I DO IN THE KITCHEN?  Clean up any spills right away.  Avoid walking on wet floors.  Keep items that you use a lot in easy-to-reach places.  If you need to reach something above you, use a strong step stool that has a grab bar.  Keep electrical cords out of the way.  Do not use floor polish or wax that makes floors slippery. If you must use wax, use non-skid floor wax.  Do not have throw rugs and other things on the floor that can make you trip. WHAT CAN I DO WITH MY STAIRS?  Do not leave any items on the stairs.  Make sure that there are handrails on both sides of the stairs and use them. Fix handrails that are broken or loose. Make sure that handrails are as long as the stairways.  Check any carpeting to make sure that it is firmly attached to the stairs. Fix any carpet that is loose or worn.  Avoid having throw rugs at the top or bottom of the stairs. If you do have throw rugs, attach them to the floor with carpet tape.  Make sure that you have a light switch at the top of the stairs and the bottom of the stairs. If you do not have them, ask someone to add them for you. WHAT ELSE CAN I DO TO HELP PREVENT FALLS?  Wear shoes that:  Do not have high heels.  Have rubber bottoms.  Are comfortable and fit you well.  Are closed at the toe. Do not wear sandals.  If you use a stepladder:  Make sure that it is fully opened. Do not climb a closed stepladder.  Make sure that both sides of the stepladder are locked into place.  Ask someone to hold it for you, if possible.  Clearly mark and make sure that you can see:  Any grab bars or  handrails.  First and last steps.  Where the edge of each step is.  Use tools that help you move around (mobility aids) if they are needed. These include:  Canes.  Walkers.  Scooters.  Crutches.  Turn on the lights when you go into a dark area. Replace any light bulbs as soon as they burn out.  Set up your furniture so you have a clear path. Avoid moving your furniture around.  If any of your floors are uneven, fix them.  If there are any pets around you, be aware of where  they are.  Review your medicines with your doctor. Some medicines can make you feel dizzy. This can increase your chance of falling. Ask your doctor what other things that you can do to help prevent falls.   This information is not intended to replace advice given to you by your health care provider. Make sure you discuss any questions you have with your health care provider.   Document Released: 09/29/2009 Document Revised: 04/19/2015 Document Reviewed: 01/07/2015 Elsevier Interactive Patient Education 2016 [place] Maintenance, Female Adopting a healthy lifestyle and getting preventive care can go a long way to promote health and wellness. Talk with your health care provider about what schedule of regular examinations is right for you. This is a good chance for you to check in with your provider about disease prevention and staying healthy. In between checkups, there are plenty of things you can do on your own. Experts have done a lot of research about which lifestyle changes and preventive measures are most likely to keep you healthy. Ask your health care provider for more information. WEIGHT AND DIET  Eat a healthy diet  Be sure to include plenty of vegetables, fruits, low-fat dairy products, and lean protein.  Do not eat a lot of foods high in solid fats, added sugars, or salt.  Get regular exercise. This is one of the most important things you can do for your health.  Most adults  should exercise for at least 150 minutes each week. The exercise should increase your heart rate and make you sweat (moderate-intensity exercise).  Most adults should also do strengthening exercises at least twice a week. This is in addition to the moderate-intensity exercise.  Maintain a healthy weight  Body mass index (BMI) is a measurement that can be used to identify possible weight problems. It estimates body fat based on height and weight. Your health care provider can help determine your BMI and help you achieve or maintain a healthy weight.  For females 36 years of age and older:   A BMI below 18.5 is considered underweight.  A BMI of 18.5 to 24.9 is normal.  A BMI of 25 to 29.9 is considered overweight.  A BMI of 30 and above is considered obese.  Watch levels of cholesterol and blood lipids  You should start having your blood tested for lipids and cholesterol at 78 years of age, then have this test every 5 years.  You may need to have your cholesterol levels checked more often if:  Your lipid or cholesterol levels are high.  You are older than 78 years of age.  You are at high risk for heart disease.  CANCER SCREENING   Lung Cancer  Lung cancer screening is recommended for adults 68-43 years old who are at high risk for lung cancer because of a history of smoking.  A yearly low-dose CT scan of the lungs is recommended for people who:  Currently smoke.  Have quit within the past 15 years.  Have at least a 30-pack-year history of smoking. A pack year is smoking an average of one pack of cigarettes a day for 1 year.  Yearly screening should continue until it has been 15 years since you quit.  Yearly screening should stop if you develop a health problem that would prevent you from having lung cancer treatment.  Breast Cancer  Practice breast self-awareness. This means understanding how your breasts normally appear and feel.  It also means doing regular  breast self-exams.  Let your health care provider know about any changes, no matter how small.  If you are in your 20s or 30s, you should have a clinical breast exam (CBE) by a health care provider every 1-3 years as part of a regular health exam.  If you are 67 or older, have a CBE every year. Also consider having a breast X-ray (mammogram) every year.  If you have a family history of breast cancer, talk to your health care provider about genetic screening.  If you are at high risk for breast cancer, talk to your health care provider about having an MRI and a mammogram every year.  Breast cancer gene (BRCA) assessment is recommended for women who have family members with BRCA-related cancers. BRCA-related cancers include:  Breast.  Ovarian.  Tubal.  Peritoneal cancers.  Results of the assessment will determine the need for genetic counseling and BRCA1 and BRCA2 testing. Cervical Cancer Your health care provider may recommend that you be screened regularly for cancer of the pelvic organs (ovaries, uterus, and vagina). This screening involves a pelvic examination, including checking for microscopic changes to the surface of your cervix (Pap test). You may be encouraged to have this screening done every 3 years, beginning at age 15.  For women ages 24-65, health care providers may recommend pelvic exams and Pap testing every 3 years, or they may recommend the Pap and pelvic exam, combined with testing for human papilloma virus (HPV), every 5 years. Some types of HPV increase your risk of cervical cancer. Testing for HPV may also be done on women of any age with unclear Pap test results.  Other health care providers may not recommend any screening for nonpregnant women who are considered low risk for pelvic cancer and who do not have symptoms. Ask your health care provider if a screening pelvic exam is right for you.  If you have had past treatment for cervical cancer or a condition that  could lead to cancer, you need Pap tests and screening for cancer for at least 20 years after your treatment. If Pap tests have been discontinued, your risk factors (such as having a new sexual partner) need to be reassessed to determine if screening should resume. Some women have medical problems that increase the chance of getting cervical cancer. In these cases, your health care provider may recommend more frequent screening and Pap tests. Colorectal Cancer  This type of cancer can be detected and often prevented.  Routine colorectal cancer screening usually begins at 78 years of age and continues through 78 years of age.  Your health care provider may recommend screening at an earlier age if you have risk factors for colon cancer.  Your health care provider may also recommend using home test kits to check for hidden blood in the stool.  A small camera at the end of a tube can be used to examine your colon directly (sigmoidoscopy or colonoscopy). This is done to check for the earliest forms of colorectal cancer.  Routine screening usually begins at age 75.  Direct examination of the colon should be repeated every 5-10 years through 78 years of age. However, you may need to be screened more often if early forms of precancerous polyps or small growths are found. Skin Cancer  Check your skin from head to toe regularly.  Tell your health care provider about any new moles or changes in moles, especially if there is a change in a mole's shape or color.  Also tell your  health care provider if you have a mole that is larger than the size of a pencil eraser.  Always use sunscreen. Apply sunscreen liberally and repeatedly throughout the day.  Protect yourself by wearing long sleeves, pants, a wide-brimmed hat, and sunglasses whenever you are outside. HEART DISEASE, DIABETES, AND HIGH BLOOD PRESSURE   High blood pressure causes heart disease and increases the risk of stroke. High blood pressure  is more likely to develop in:  People who have blood pressure in the high end of the normal range (130-139/85-89 mm Hg).  People who are overweight or obese.  People who are African American.  If you are 21-62 years of age, have your blood pressure checked every 3-5 years. If you are 11 years of age or older, have your blood pressure checked every year. You should have your blood pressure measured twice--once when you are at a hospital or clinic, and once when you are not at a hospital or clinic. Record the average of the two measurements. To check your blood pressure when you are not at a hospital or clinic, you can use:  An automated blood pressure machine at a pharmacy.  A home blood pressure monitor.  If you are between 66 years and 35 years old, ask your health care provider if you should take aspirin to prevent strokes.  Have regular diabetes screenings. This involves taking a blood sample to check your fasting blood sugar level.  If you are at a normal weight and have a low risk for diabetes, have this test once every three years after 78 years of age.  If you are overweight and have a high risk for diabetes, consider being tested at a younger age or more often. PREVENTING INFECTION  Hepatitis B  If you have a higher risk for hepatitis B, you should be screened for this virus. You are considered at high risk for hepatitis B if:  You were born in a country where hepatitis B is common. Ask your health care provider which countries are considered high risk.  Your parents were born in a high-risk country, and you have not been immunized against hepatitis B (hepatitis B vaccine).  You have HIV or AIDS.  You use needles to inject street drugs.  You live with someone who has hepatitis B.  You have had sex with someone who has hepatitis B.  You get hemodialysis treatment.  You take certain medicines for conditions, including cancer, organ transplantation, and autoimmune  conditions. Hepatitis C  Blood testing is recommended for:  Everyone born from [birth year] through 1963.  Anyone with known risk factors for hepatitis C. Sexually transmitted infections (STIs)  You should be screened for sexually transmitted infections (STIs) including gonorrhea and chlamydia if:  You are sexually active and are younger than 78 years of age.  You are older than 78 years of age and your health care provider tells you that you are at risk for this type of infection.  Your sexual activity has changed since you were last screened and you are at an increased risk for chlamydia or gonorrhea. Ask your health care provider if you are at risk.  If you do not have HIV, but are at risk, it may be recommended that you take a prescription medicine daily to prevent HIV infection. This is called pre-exposure prophylaxis (PrEP). You are considered at risk if:  You are sexually active and do not regularly use condoms or know the HIV status of your partner(s).  You  take drugs by injection.  You are sexually active with a partner who has HIV. Talk with your health care provider about whether you are at high risk of being infected with HIV. If you choose to begin PrEP, you should first be tested for HIV. You should then be tested every 3 months for as long as you are taking PrEP.  PREGNANCY   If you are premenopausal and you may become pregnant, ask your health care provider about preconception counseling.  If you may become pregnant, take 400 to 800 micrograms (mcg) of folic acid every day.  If you want to prevent pregnancy, talk to your health care provider about birth control (contraception). OSTEOPOROSIS AND MENOPAUSE   Osteoporosis is a disease in which the bones lose minerals and strength with aging. This can result in serious bone fractures. Your risk for osteoporosis can be identified using a bone density scan.  If you are 73 years of age or older, or if you are at risk for  osteoporosis and fractures, ask your health care provider if you should be screened.  Ask your health care provider whether you should take a calcium or vitamin D supplement to lower your risk for osteoporosis.  Menopause may have certain physical symptoms and risks.  Hormone replacement therapy may reduce some of these symptoms and risks. Talk to your health care provider about whether hormone replacement therapy is right for you.  HOME CARE INSTRUCTIONS   Schedule regular health, dental, and eye exams.  Stay current with your immunizations.   Do not use any tobacco products including cigarettes, chewing tobacco, or electronic cigarettes.  If you are pregnant, do not drink alcohol.  If you are breastfeeding, limit how much and how often you drink alcohol.  Limit alcohol intake to no more than 1 drink per day for nonpregnant women. One drink equals 12 ounces of beer, 5 ounces of wine, or 1 ounces of hard liquor.  Do not use street drugs.  Do not share needles.  Ask your health care provider for help if you need support or information about quitting drugs.  Tell your health care provider if you often feel depressed.  Tell your health care provider if you have ever been abused or do not feel safe at home.   This information is not intended to replace advice given to you by your health care provider. Make sure you discuss any questions you have with your health care provider.   Document Released: 06/18/2011 Document Revised: 12/24/2014 Document Reviewed: 11/04/2013 Elsevier Interactive Patient Education [employer].
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
## 2016-09-04 NOTE — Progress Notes (Signed)
Lauren (RN) requested a Behavioral Health Consult.   ° °Presenting Issue:  Anxiety ° °Report of symptoms:  Patient stated that she feels high anxiety when she comes to the doctor due to a traumatic experience during a hysterectomy. Patient was exhibiting high panic symptoms, breathing so rapidly that she had difficulty speaking and was trembling. Patient also mentioned anxiety about her daughter, who she stated is an alcoholic. Patient reported that symptoms are only present at doctor and she is fine at home and other places. Patient was eager to leave today when I met with her and stated that this would be only way for her panic to stop.  ° °Assessment / Plan / Recommendations:  Patient was quite distressed today and declined returning for BHC services, stating that she is experiencing anxiety/panic due to being at doctor's office. She declined wanting to see a therapist in the community, stating that she is "fine" when she is not here. BHC led patient in deep breathing exercise. She briefly became calmer and was able to talk and smile with BHC for a couple of minutes when talking about going to the grocery store. However, she became very anxious again and quite tearful when expressing that she can't do the things she used to like doing, stating that she "doesn't want to be like this." Patient would likely benefit from outpatient behavioral health services but declines at this time. Patient agreed to having BHC follow up over the phone next week.  ° °

## 2016-09-06 ENCOUNTER — Encounter: Payer: Self-pay | Admitting: Psychology

## 2016-09-06 ENCOUNTER — Ambulatory Visit (INDEPENDENT_AMBULATORY_CARE_PROVIDER_SITE_OTHER): Payer: Commercial Managed Care - HMO | Admitting: Psychology

## 2016-09-06 DIAGNOSIS — R413 Other amnesia: Secondary | ICD-10-CM | POA: Diagnosis not present

## 2016-09-06 DIAGNOSIS — F411 Generalized anxiety disorder: Secondary | ICD-10-CM

## 2016-09-06 DIAGNOSIS — F409 Phobic anxiety disorder, unspecified: Secondary | ICD-10-CM

## 2016-09-07 ENCOUNTER — Encounter: Payer: Self-pay | Admitting: *Deleted

## 2016-09-07 NOTE — Progress Notes (Signed)
I have reviewed this visit and discussed with Lauren Ducatte, RN, BSN, and agree with her documentation.   

## 2016-09-07 NOTE — Progress Notes (Signed)
Done, thanks

## 2016-10-11 ENCOUNTER — Ambulatory Visit (INDEPENDENT_AMBULATORY_CARE_PROVIDER_SITE_OTHER): Payer: Commercial Managed Care - HMO | Admitting: Family Medicine

## 2016-10-11 ENCOUNTER — Encounter: Payer: Self-pay | Admitting: Family Medicine

## 2016-10-11 DIAGNOSIS — F419 Anxiety disorder, unspecified: Secondary | ICD-10-CM

## 2016-10-11 MED ORDER — SERTRALINE HCL 50 MG PO TABS: 50.0000 mg | ORAL_TABLET | Freq: Every day | ORAL | 5 refills | Status: DC

## 2016-10-11 NOTE — Progress Notes (Signed)
   HPI  CC: Anxiety Patient is here for follow-up with her anxiety. She states that she does not feel as though her anxiety is completely well controlled. She is asking for a different medication. She states that she is aware that her Celexa is no currently maxed out with its dosing but that the higher dosing she taken previously caused constipation. Her anxiety continues to be relatively debilitating. Patient had a "panic attack" in the waiting room and had to be brought back and roomed sooner than anticipated. She denies any signs or symptoms of adverse side effects. No SI/HI. No fevers or chills. She endorses a tremor that is at its baseline.   Review of Systems   See HPI for ROS. All other systems reviewed and are negative.  CC, SH/smoking status, and VS noted  Objective: BP (!) 150/87 (BP Location: Left Arm, Patient Position: Sitting, Cuff Size: Normal)   Pulse 81   Temp 97.8 F (36.6 C) (Oral)   SpO2 98%  Gen: NAD, alert, cooperative, very anxious appearing. CV: RRR, no murmur Resp: CTAB, no wheezes, non-labored Neuro: Alert and oriented, Speech clear, No gross deficits  Assessment and plan:  Anxiety Worse: Patient is here with complaints of uncontrolled anxiety. She is unwilling to increase her dose of Celexa as she states this has caused constipation in the past. She is asking for a change in medications. I discussed with her the fact that she was a poor candidate for benzodiazepines due to her age.  - Discontinue Celexa - Initiate Zoloft  Next: If patient does not do well with Zoloft I will strongly consider Prozac as this typically has better anxiolytic properties (Prozac is not on patient's formulary)   Meds ordered this encounter  Medications  . sertraline (ZOLOFT) 50 MG tablet    Sig: Take 1 tablet (50 mg total) by mouth daily.    Dispense:  30 tablet    Refill:  5     Kathee DeltonIan D McKeag, MD,MS,  PGY3 10/11/2016 12:13 PM

## 2016-10-11 NOTE — Assessment & Plan Note (Signed)
Worse: Patient is here with complaints of uncontrolled anxiety. She is unwilling to increase her dose of Celexa as she states this has caused constipation in the past. She is asking for a change in medications. I discussed with her the fact that she was a poor candidate for benzodiazepines due to her age.  - Discontinue Celexa - Initiate Zoloft  Next: If patient does not do well with Zoloft I will strongly consider Prozac as this typically has better anxiolytic properties (Prozac is not on patient's formulary)

## 2016-10-11 NOTE — Patient Instructions (Signed)
It was a pleasure seeing you today in our clinic. Today we discussed your anxiety. Here is the treatment plan we have discussed and agreed upon together:   - Stop taking the Celexa. - Please start taking the Zoloft. This new prescription has been sent to your pharmacy. - I would like to see you back in one month.

## 2016-10-31 ENCOUNTER — Ambulatory Visit: Payer: Commercial Managed Care - HMO | Admitting: Psychology

## 2016-11-02 ENCOUNTER — Ambulatory Visit (INDEPENDENT_AMBULATORY_CARE_PROVIDER_SITE_OTHER): Payer: Commercial Managed Care - HMO | Admitting: Psychology

## 2016-11-02 DIAGNOSIS — F411 Generalized anxiety disorder: Secondary | ICD-10-CM | POA: Diagnosis not present

## 2016-11-07 ENCOUNTER — Ambulatory Visit (INDEPENDENT_AMBULATORY_CARE_PROVIDER_SITE_OTHER): Payer: Commercial Managed Care - HMO | Admitting: Psychology

## 2016-11-07 DIAGNOSIS — F411 Generalized anxiety disorder: Secondary | ICD-10-CM

## 2016-11-12 ENCOUNTER — Ambulatory Visit: Payer: Commercial Managed Care - HMO | Admitting: Family Medicine

## 2016-11-16 ENCOUNTER — Ambulatory Visit (INDEPENDENT_AMBULATORY_CARE_PROVIDER_SITE_OTHER): Payer: Commercial Managed Care - HMO | Admitting: Psychology

## 2016-11-16 ENCOUNTER — Ambulatory Visit (INDEPENDENT_AMBULATORY_CARE_PROVIDER_SITE_OTHER): Payer: Commercial Managed Care - HMO | Admitting: Family Medicine

## 2016-11-16 VITALS — BP 149/64 | HR 97 | Temp 97.5°F | Wt 123.0 lb

## 2016-11-16 DIAGNOSIS — F419 Anxiety disorder, unspecified: Secondary | ICD-10-CM

## 2016-11-16 DIAGNOSIS — F411 Generalized anxiety disorder: Secondary | ICD-10-CM | POA: Diagnosis not present

## 2016-11-16 DIAGNOSIS — Z23 Encounter for immunization: Secondary | ICD-10-CM

## 2016-11-16 NOTE — Patient Instructions (Addendum)
It was a pleasure seeing you today in our clinic. Today we discussed your depression and appetite. Here is the treatment plan we have discussed and agreed upon together:   - Continue taking your Zoloft as prescribed. - Continue taking your atorvastatin as prescribed.

## 2016-11-16 NOTE — Progress Notes (Signed)
   HPI  CC: Medication check, anxiety Patient is here to follow-up after initiating a new SSRI. She states that she is feeling much better and has less anxiety than at her previous visit. She denies any adverse effects or symptoms that she has recognized from this new medication. Is currently pleased with the results of this new medication. She denies any fever, chills, headache, blurred vision, lightheadedness, dizziness, cough, shortness of breath, chest pain, nausea, vomiting, or diarrhea.  Patient does endorse 2 falls recently. After further discussion it appears as though both falls were secondary to unique situations and poor footing. No seizure activity, no loss of consciousness, no associated dizziness or lightheadedness or vision changes. No head trauma during these falls.  Review of Systems    See HPI for ROS. All other systems reviewed and are negative.  CC, SH/smoking status, and VS noted  Objective: BP (!) 149/64 (BP Location: Left Arm, Patient Position: Sitting, Cuff Size: Normal)   Pulse 97   Temp 97.5 F (36.4 C) (Oral)   Wt 123 lb (55.8 kg)   SpO2 98%   BMI 19.85 kg/m  Gen: NAD, alert, cooperative, anxious appearing (improved from previous visit) CV: RRR, no murmur Resp: CTAB, no wheezes, non-labored Neuro: Alert and oriented, Speech clear, No gross deficits  Assessment and plan:  Anxiety Improved: - Patient is here for follow-up after initiating a new SSRI medication. She states that she feels significantly better from previous visit and she is happy with the results from her Zoloft. PHQ9 = 2 today. - Continue Zoloft 50 mg daily. - Follow-up in 3 months   Orders Placed This Encounter  Procedures  . Pneumococcal conjugate vaccine 13-valent     Kathee DeltonIan D McKeag, MD,MS,  PGY3 11/16/2016 6:32 PM

## 2016-11-16 NOTE — Assessment & Plan Note (Signed)
Improved: - Patient is here for follow-up after initiating a new SSRI medication. She states that she feels significantly better from previous visit and she is happy with the results from her Zoloft. PHQ9 = 2 today. - Continue Zoloft 50 mg daily. - Follow-up in 3 months

## 2016-11-19 DIAGNOSIS — H5210 Myopia, unspecified eye: Secondary | ICD-10-CM | POA: Diagnosis not present

## 2016-12-04 DIAGNOSIS — H25812 Combined forms of age-related cataract, left eye: Secondary | ICD-10-CM | POA: Diagnosis not present

## 2016-12-04 DIAGNOSIS — H25811 Combined forms of age-related cataract, right eye: Secondary | ICD-10-CM | POA: Diagnosis not present

## 2016-12-06 DIAGNOSIS — H25811 Combined forms of age-related cataract, right eye: Secondary | ICD-10-CM | POA: Diagnosis not present

## 2016-12-06 DIAGNOSIS — H2511 Age-related nuclear cataract, right eye: Secondary | ICD-10-CM | POA: Diagnosis not present

## 2016-12-13 DIAGNOSIS — H348312 Tributary (branch) retinal vein occlusion, right eye, stable: Secondary | ICD-10-CM | POA: Diagnosis not present

## 2016-12-27 DIAGNOSIS — H2512 Age-related nuclear cataract, left eye: Secondary | ICD-10-CM | POA: Diagnosis not present

## 2016-12-27 DIAGNOSIS — H25812 Combined forms of age-related cataract, left eye: Secondary | ICD-10-CM | POA: Diagnosis not present

## 2017-02-02 DIAGNOSIS — H524 Presbyopia: Secondary | ICD-10-CM | POA: Diagnosis not present

## 2017-02-02 DIAGNOSIS — H52209 Unspecified astigmatism, unspecified eye: Secondary | ICD-10-CM | POA: Diagnosis not present

## 2017-02-02 DIAGNOSIS — H5213 Myopia, bilateral: Secondary | ICD-10-CM | POA: Diagnosis not present

## 2017-03-09 ENCOUNTER — Inpatient Hospital Stay (HOSPITAL_COMMUNITY)
Admission: EM | Admit: 2017-03-09 | Discharge: 2017-03-12 | DRG: 948 | Disposition: A | Discharge status: Skilled Nursing Facility | Payer: Medicare Other | Attending: Family Medicine | Admitting: Family Medicine

## 2017-03-09 ENCOUNTER — Emergency Department (HOSPITAL_COMMUNITY): Payer: Medicare Other

## 2017-03-09 ENCOUNTER — Inpatient Hospital Stay (HOSPITAL_COMMUNITY): Payer: Medicare Other

## 2017-03-09 ENCOUNTER — Other Ambulatory Visit: Payer: Self-pay | Admitting: Internal Medicine

## 2017-03-09 ENCOUNTER — Encounter (HOSPITAL_COMMUNITY): Payer: Self-pay | Admitting: Emergency Medicine

## 2017-03-09 DIAGNOSIS — M25532 Pain in left wrist: Secondary | ICD-10-CM | POA: Diagnosis not present

## 2017-03-09 DIAGNOSIS — Z79899 Other long term (current) drug therapy: Secondary | ICD-10-CM

## 2017-03-09 DIAGNOSIS — F4310 Post-traumatic stress disorder, unspecified: Secondary | ICD-10-CM | POA: Diagnosis not present

## 2017-03-09 DIAGNOSIS — S0181XA Laceration without foreign body of other part of head, initial encounter: Secondary | ICD-10-CM | POA: Diagnosis not present

## 2017-03-09 DIAGNOSIS — F0390 Unspecified dementia without behavioral disturbance: Secondary | ICD-10-CM | POA: Diagnosis present

## 2017-03-09 DIAGNOSIS — Z8673 Personal history of transient ischemic attack (TIA), and cerebral infarction without residual deficits: Secondary | ICD-10-CM

## 2017-03-09 DIAGNOSIS — R410 Disorientation, unspecified: Principal | ICD-10-CM

## 2017-03-09 DIAGNOSIS — Z9181 History of falling: Secondary | ICD-10-CM | POA: Diagnosis not present

## 2017-03-09 DIAGNOSIS — S52502A Unspecified fracture of the lower end of left radius, initial encounter for closed fracture: Secondary | ICD-10-CM

## 2017-03-09 DIAGNOSIS — E785 Hyperlipidemia, unspecified: Secondary | ICD-10-CM | POA: Diagnosis present

## 2017-03-09 DIAGNOSIS — I959 Hypotension, unspecified: Secondary | ICD-10-CM | POA: Diagnosis not present

## 2017-03-09 DIAGNOSIS — Z7982 Long term (current) use of aspirin: Secondary | ICD-10-CM

## 2017-03-09 DIAGNOSIS — Z681 Body mass index (BMI) 19 or less, adult: Secondary | ICD-10-CM | POA: Diagnosis not present

## 2017-03-09 DIAGNOSIS — S098XXA Other specified injuries of head, initial encounter: Secondary | ICD-10-CM | POA: Diagnosis not present

## 2017-03-09 DIAGNOSIS — F419 Anxiety disorder, unspecified: Secondary | ICD-10-CM | POA: Diagnosis not present

## 2017-03-09 DIAGNOSIS — N390 Urinary tract infection, site not specified: Secondary | ICD-10-CM | POA: Diagnosis not present

## 2017-03-09 DIAGNOSIS — R079 Chest pain, unspecified: Secondary | ICD-10-CM | POA: Diagnosis not present

## 2017-03-09 DIAGNOSIS — S52592A Other fractures of lower end of left radius, initial encounter for closed fracture: Secondary | ICD-10-CM | POA: Diagnosis present

## 2017-03-09 DIAGNOSIS — Y92002 Bathroom of unspecified non-institutional (private) residence single-family (private) house as the place of occurrence of the external cause: Secondary | ICD-10-CM | POA: Diagnosis not present

## 2017-03-09 DIAGNOSIS — B961 Klebsiella pneumoniae [K. pneumoniae] as the cause of diseases classified elsewhere: Secondary | ICD-10-CM | POA: Diagnosis not present

## 2017-03-09 DIAGNOSIS — D62 Acute posthemorrhagic anemia: Secondary | ICD-10-CM | POA: Diagnosis not present

## 2017-03-09 DIAGNOSIS — M79645 Pain in left finger(s): Secondary | ICD-10-CM | POA: Diagnosis not present

## 2017-03-09 DIAGNOSIS — S52552A Other extraarticular fracture of lower end of left radius, initial encounter for closed fracture: Secondary | ICD-10-CM

## 2017-03-09 DIAGNOSIS — Z880 Allergy status to penicillin: Secondary | ICD-10-CM

## 2017-03-09 DIAGNOSIS — I10 Essential (primary) hypertension: Secondary | ICD-10-CM | POA: Diagnosis present

## 2017-03-09 DIAGNOSIS — R7303 Prediabetes: Secondary | ICD-10-CM | POA: Diagnosis not present

## 2017-03-09 DIAGNOSIS — R310 Gross hematuria: Secondary | ICD-10-CM | POA: Diagnosis not present

## 2017-03-09 DIAGNOSIS — S299XXA Unspecified injury of thorax, initial encounter: Secondary | ICD-10-CM | POA: Diagnosis not present

## 2017-03-09 DIAGNOSIS — S6992XA Unspecified injury of left wrist, hand and finger(s), initial encounter: Secondary | ICD-10-CM | POA: Diagnosis not present

## 2017-03-09 DIAGNOSIS — W19XXXA Unspecified fall, initial encounter: Secondary | ICD-10-CM

## 2017-03-09 DIAGNOSIS — S060X9A Concussion with loss of consciousness of unspecified duration, initial encounter: Secondary | ICD-10-CM

## 2017-03-09 DIAGNOSIS — W010XXA Fall on same level from slipping, tripping and stumbling without subsequent striking against object, initial encounter: Secondary | ICD-10-CM | POA: Diagnosis not present

## 2017-03-09 DIAGNOSIS — R51 Headache: Secondary | ICD-10-CM | POA: Diagnosis not present

## 2017-03-09 DIAGNOSIS — S022XXA Fracture of nasal bones, initial encounter for closed fracture: Secondary | ICD-10-CM

## 2017-03-09 DIAGNOSIS — S0993XA Unspecified injury of face, initial encounter: Secondary | ICD-10-CM | POA: Diagnosis not present

## 2017-03-09 DIAGNOSIS — S0010XA Contusion of unspecified eyelid and periocular area, initial encounter: Secondary | ICD-10-CM | POA: Diagnosis not present

## 2017-03-09 DIAGNOSIS — S0990XA Unspecified injury of head, initial encounter: Secondary | ICD-10-CM | POA: Diagnosis not present

## 2017-03-09 LAB — CBG MONITORING, ED
Glucose-Capillary: 140 mg/dL — ABNORMAL HIGH (ref 65–99)
Glucose-Capillary: 123 mg/dL — ABNORMAL HIGH (ref 65–99)

## 2017-03-09 LAB — TYPE AND SCREEN
ABO/RH(D): O POS
Antibody Screen: NEGATIVE
Unit division: 0
Unit division: 0

## 2017-03-09 LAB — PREPARE FRESH FROZEN PLASMA
Unit division: 0
Unit division: 0

## 2017-03-09 LAB — PROTIME-INR
INR: 1.03
Prothrombin Time: 13.5 seconds (ref 11.4–15.2)

## 2017-03-09 LAB — COMPREHENSIVE METABOLIC PANEL
ALK PHOS: 72 U/L (ref 38–126)
ALT: 23 U/L (ref 14–54)
AST: 28 U/L (ref 15–41)
Albumin: 3 g/dL — ABNORMAL LOW (ref 3.5–5.0)
Anion gap: 12 (ref 5–15)
BUN: 34 mg/dL — AB (ref 6–20)
CALCIUM: 9.1 mg/dL (ref 8.9–10.3)
CHLORIDE: 105 mmol/L (ref 101–111)
CO2: 23 mmol/L (ref 22–32)
CREATININE: 0.76 mg/dL (ref 0.44–1.00)
GFR calc non Af Amer: >60 mL/min (ref >60)
GLUCOSE: 243 mg/dL — AB (ref 65–99)
Potassium: 4 mmol/L (ref 3.5–5.1)
SODIUM: 140 mmol/L (ref 135–145)
Total Bilirubin: 0.8 mg/dL (ref 0.3–1.2)
Total Protein: 5.6 g/dL — ABNORMAL LOW (ref 6.5–8.1)

## 2017-03-09 LAB — BPAM FFP
Blood Product Expiration Date: 201803262359
Blood Product Expiration Date: 201803262359
ISSUE DATE / TIME: 201803240259
ISSUE DATE / TIME: 201803240259
Unit Type and Rh: 6200
Unit Type and Rh: 6200

## 2017-03-09 LAB — COOXEMETRY PANEL
CARBOXYHEMOGLOBIN: 1 % (ref 0.5–1.5)
METHEMOGLOBIN: 0.9 % (ref 0.0–1.5)
O2 Saturation: 99.2 %
TOTAL HEMOGLOBIN: 10.9 g/dL — AB (ref 12.0–16.0)

## 2017-03-09 LAB — BPAM RBC
Blood Product Expiration Date: 201804102359
Blood Product Expiration Date: 201804112359
ISSUE DATE / TIME: 201803240258
ISSUE DATE / TIME: 201803240258
Unit Type and Rh: 9500
Unit Type and Rh: 9500

## 2017-03-09 LAB — CBC
HCT: 35.6 % — ABNORMAL LOW (ref 36.0–46.0)
HEMATOCRIT: 32.1 % — AB (ref 36.0–46.0)
Hemoglobin: 11.4 g/dL — ABNORMAL LOW (ref 12.0–15.0)
Hemoglobin: 10.7 g/dL — ABNORMAL LOW (ref 12.0–15.0)
MCH: 28.1 pg (ref 26.0–34.0)
MCH: 28.5 pg (ref 26.0–34.0)
MCHC: 32 g/dL (ref 30.0–36.0)
MCHC: 33.3 g/dL (ref 30.0–36.0)
MCV: 87.7 fL (ref 78.0–100.0)
MCV: 85.4 fL (ref 78.0–100.0)
PLATELETS: 202 10*3/uL (ref 150–400)
Platelets: 188 10*3/uL (ref 150–400)
RBC: 4.06 MIL/uL (ref 3.87–5.11)
RBC: 3.76 MIL/uL — AB (ref 3.87–5.11)
RDW: 14.2 % (ref 11.5–15.5)
RDW: 14.2 % (ref 11.5–15.5)
WBC: 17 10*3/uL — ABNORMAL HIGH (ref 4.0–10.5)
WBC: 11 10*3/uL — AB (ref 4.0–10.5)

## 2017-03-09 LAB — URINALYSIS, ROUTINE W REFLEX MICROSCOPIC
BILIRUBIN URINE: NEGATIVE
Glucose, UA: NEGATIVE mg/dL
KETONES UR: NEGATIVE mg/dL
NITRITE: NEGATIVE
PROTEIN: 30 mg/dL — AB
Specific Gravity, Urine: 1.02 (ref 1.005–1.030)
pH: 6 (ref 5.0–8.0)

## 2017-03-09 LAB — AMMONIA: Ammonia: 20 umol/L (ref 9–35)

## 2017-03-09 LAB — TROPONIN I
TROPONIN I: 0.07 ng/mL — AB (ref <0.03)
Troponin I: 0.09 ng/mL (ref <0.03)
Troponin I: 0.09 ng/mL (ref <0.03)

## 2017-03-09 LAB — RAPID URINE DRUG SCREEN, HOSP PERFORMED
Amphetamines: NOT DETECTED
BARBITURATES: NOT DETECTED
BENZODIAZEPINES: NOT DETECTED
COCAINE: NOT DETECTED
OPIATES: NOT DETECTED
TETRAHYDROCANNABINOL: NOT DETECTED

## 2017-03-09 LAB — MAGNESIUM: Magnesium: 1.8 mg/dL (ref 1.7–2.4)

## 2017-03-09 LAB — GLUCOSE, CAPILLARY: Glucose-Capillary: 110 mg/dL — ABNORMAL HIGH (ref 65–99)

## 2017-03-09 LAB — TSH: TSH: 0.473 u[IU]/mL (ref 0.350–4.500)

## 2017-03-09 LAB — ACETAMINOPHEN LEVEL: Acetaminophen (Tylenol), Serum: 11 ug/mL (ref 10–30)

## 2017-03-09 LAB — ETHANOL

## 2017-03-09 LAB — CK: CK TOTAL: 412 U/L — AB (ref 38–234)

## 2017-03-09 MED ORDER — SODIUM CHLORIDE 0.9 % IV SOLN
INTRAVENOUS | Status: DC
  Administered 2017-03-09 – 2017-03-10 (×2): via INTRAVENOUS

## 2017-03-09 MED ORDER — ACETAMINOPHEN 325 MG PO TABS
650.0000 mg | ORAL_TABLET | Freq: Four times a day (QID) | ORAL | Status: DC | PRN
  Administered 2017-03-09 – 2017-03-12 (×9): 650 mg via ORAL
  Filled 2017-03-09 (×9): qty 2

## 2017-03-09 MED ORDER — SODIUM CHLORIDE 0.9% FLUSH
3.0000 mL | Freq: Two times a day (BID) | INTRAVENOUS | Status: DC
  Administered 2017-03-11 – 2017-03-12 (×2): 3 mL via INTRAVENOUS

## 2017-03-09 MED ORDER — ACETAMINOPHEN 650 MG RE SUPP
650.0000 mg | Freq: Four times a day (QID) | RECTAL | Status: DC | PRN
Start: 2017-03-09 — End: 2017-03-12

## 2017-03-09 MED ORDER — NITROFURANTOIN MONOHYD MACRO 100 MG PO CAPS
100.0000 mg | ORAL_CAPSULE | Freq: Two times a day (BID) | ORAL | Status: DC
  Administered 2017-03-09 – 2017-03-12 (×7): 100 mg via ORAL
  Filled 2017-03-09 (×11): qty 1
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
## 2017-03-09 NOTE — ED Notes (Signed)
Pt to Xray and CT

## 2017-03-09 NOTE — ED Notes (Addendum)
Admitting MD at bedside. Pt able to follow some commands. Pt will answer question with yes and no. When asked specific questions pt is unable to give detail. Pt unable to give birthday, age or year.

## 2017-03-09 NOTE — ED Notes (Addendum)
Pt unable to verbalize, not oriented, confused, rather anxious. RN informed MD of mental status change.  MD bedside

## 2017-03-09 NOTE — ED Notes (Signed)
QNS phleb Ashleigh will get labs.

## 2017-03-09 NOTE — ED Notes (Signed)
Admitting at bedside speaking with pt and family.

## 2017-03-09 NOTE — ED Notes (Addendum)
This RN was [facility] [employer] [facility] pt when her son called. Pt was able to have some conversation with him. Pt asked if this RN would speak to him. Pt continues to have difficulty with speaking.  He was updated and stated that he was on the way. Pt face was further cleaned. Pt remains alert and able to follow commands

## 2017-03-09 NOTE — ED Notes (Signed)
Notified Dr Chanetta Marshallimberlake of pt's current pain status.

## 2017-03-09 NOTE — Progress Notes (Signed)
Pt admitted to the unit from ED; pt alert and verbally responsive; MAE x4; LUE has splint with ace wrap clean, dry and intact in place; neuro check wnl; old dried blood noted to bilateral toes; pt VSS: telemetry applied and verified with CCMD: NT called to second verify. Skin intact with no pressure ulcers or open wounds noted except bruising to face and eyes; right eye edematous, closed; laceration with sutures intact open to air to head upper right eyelid; fall/safety precaution and prevention education completed with pt; call light within reach; bed alarm on. Will closely monitor pt. Dionne BucyP. Amo Darion Juhasz RN

## 2017-03-09 NOTE — ED Notes (Signed)
Ortho has replaced pt's sugartong splint to her left arm

## 2017-03-09 NOTE — ED Notes (Signed)
Pt has removed her left arm splint. Ortho called to come replace it.

## 2017-03-09 NOTE — ED Notes (Signed)
CBG 123 

## 2017-03-09 NOTE — ED Notes (Signed)
Back from CT w/ pt

## 2017-03-09 NOTE — ED Provider Notes (Signed)
[facility] con [facility]EMERGENCY DEPT Provider Note   CSN: [id number] Arrival date & time: 03/09/17  0310   By signing my name below, I, Clarisse Gouge, attest that this documentation has been prepared under the direction and in the presence of Zadie Rhine, MD. Electronically signed, Clarisse Gouge, ED Scribe. 03/09/17. 3:29 AM.   History   Chief Complaint No chief complaint on file.  LEVEL 5 CAVEAT D/T ACUITY OF CONDITION  The history is provided by the patient, medical records and the EMS personnel. No language interpreter was used.    HPI Comments: Olivia Robertson is a 81 y.o. female BIB EMS from home who presents to the Emergency Department with a significant forehead laceration s/p an unwitnessed mechanical fall that occurred < 2 hours prior to evaluation.  Pt reportedly slipped an fell in the restroom, striking her head on the floor on impact. A large cut is noted above the R eye that EMS state was spurting blood before they controlled after 3 gauze dressings. EMS note the pt had blood pressures of 75/60 at home, which rose to 107/60 after 300 cc's of fluids dispensed at home, and readings indicate 134/74 on evaluation. 18 gauge noted in R AC, per EMS. Pt lives in alone, per EMS. Pt currently c/o L wrist pain > than head pain and nose pain. Pt also reportedly takes ASA regularly and EMS notes Hx of anxiety. She denies vision changes, face pain and neck pain.  PMH - unknown Soc hx - unknown  OB History    No data available       Home Medications    Prior to Admission medications   Not on File    Family History No family history on file.  Social History Social History  Substance Use Topics  . Smoking status: Not on file  . Smokeless tobacco: Not on file  . Alcohol use Not on file     Allergies   Patient has no allergy information on record.   Review of Systems Review of Systems  Unable to perform ROS: Acuity of condition     Physical Exam Updated Vital  Signs BP 124/84 (BP Location: Right Arm)   Pulse 81   Temp 98.1 F (36.7 C) (Oral)   Resp (!) 26   SpO2 100%   Physical Exam CONSTITUTIONAL: elderly and anxious appearing HEAD: large wound to R forehead that is actively bleeding EYES: EOMI/PERRL, bruising over right eye noted ENMT: Tenderness and bruising to nose, no septal hematoma, no facial crepitus NECK: supple no meningeal signs SPINE/BACK:entire spine nontender, No bruising/crepitance/stepoffs noted to spine CV: S1/S2 noted, no murmurs/rubs/gallops noted LUNGS: Lungs are clear to auscultation bilaterally, no apparent distress ABDOMEN: soft, nontender GU:no cva tenderness NEURO: Pt is awake/alert/appropriate, moves all extremitiesx4.  No facial droop.   EXTREMITIES: pulses normal/equal, full ROM SKIN: dried blood noted throughout body PSYCH: anxious  ED Treatments / Results  DIAGNOSTIC STUDIES: Oxygen Saturation is 100% on RA, adequate by my interpretation.     Labs (all labs ordered are listed, but only abnormal results are displayed) Labs Reviewed  COMPREHENSIVE METABOLIC PANEL - Abnormal; Notable for the following:       Result Value   Glucose, Bld 243 (*)    BUN 34 (*)    Total Protein 5.6 (*)    Albumin 3.0 (*)    All other components within normal limits  CBC - Abnormal; Notable for the following:    WBC 17.0 (*)    Hemoglobin 11.4 (*)  HCT 35.6 (*)    All other components within normal limits  CBG MONITORING, ED - Abnormal; Notable for the following:    Glucose-Capillary 140 (*)    All other components within normal limits  ETHANOL  PROTIME-INR  URINALYSIS, ROUTINE W REFLEX MICROSCOPIC  TYPE AND SCREEN  PREPARE FRESH FROZEN PLASMA  ABO/RH    EKG  EKG Interpretation  Date/Time:  Saturday March 09 2017 03:29:51 EDT Ventricular Rate:  81 PR Interval:    QRS Duration: 88 QT Interval:  390 QTC Calculation: 453 R Axis:   49 Text Interpretation:  Sinus rhythm Normal ECG No previous ECGs available  Confirmed by Bebe Shaggy  MD, Marleny Faller ([id number]) on 03/09/2017 3:37:21 AM       Radiology Dg Chest 2 View  Result Date: 03/09/2017 CLINICAL DATA:  Pain following fall EXAM: CHEST  2 VIEW COMPARISON:  None. FINDINGS: There is no edema or consolidation. Heart is upper normal in size with pulmonary vascularity within normal limits. No adenopathy. There is calcification in the mitral annulus. There is aortic atherosclerosis. No pneumothorax. Bones osteoporotic. No fracture evident. IMPRESSION: No edema or consolidation. Aortic atherosclerosis. No pneumothorax. Electronically Signed   By: Bretta Bang III M.D.   On: 03/09/2017 07:41   Dg Wrist Complete Left  Result Date: 03/09/2017 CLINICAL DATA:  Pain following fall EXAM: LEFT WRIST - COMPLETE 3+ VIEW COMPARISON:  None. FINDINGS: Frontal, oblique, lateral, and ulnar deviation scaphoid images were obtained. There is a subtle transverse lucency in the distal radial metaphysis, concerning for a subtle nondisplaced fracture in this area. No other evidence suggesting fracture. No dislocation. There is extensive osteoarthritic change in the scaphotrapezial and first carpal -metacarpal joints. No erosive change. Bones are osteoporotic. IMPRESSION: Subtle transverse lucency distal radial metaphysis, concerning for nondisplaced fracture in this area. Note that the adjacent pronator quadratus fat pad is not elevated, however. It is possible that this linear lucency represents a prominent nutrient foramen in this patient with underlying osteoporosis. If patient is focally tender in the distal radial metaphysis region, it would be prudent to treat this area as a nondisplaced fracture. No other evidence of potential fracture. No dislocation. There is osteoarthritic change, most marked in the scaphotrapezial and first carpal -metacarpal regions. Electronically Signed   By: Bretta Bang III M.D.   On: 03/09/2017 07:40   Ct Head Wo Contrast  Result Date:  03/09/2017 CLINICAL DATA:  Pain following fall. Progression of altered mental status EXAM: CT HEAD WITHOUT CONTRAST TECHNIQUE: Contiguous axial images were obtained from the base of the skull through the vertex without intravenous contrast. COMPARISON:  Study obtained earlier in the day FINDINGS: Brain: Mild diffuse atrophy is stable. There is no intracranial mass, hemorrhage, extra-axial fluid collection, or midline shift. Mild small vessel disease in the centra semiovale bilaterally is stable. No evident acute infarct. Vascular: No hyperdense vessel. There is calcification in each carotid siphon. Skull: The bony calvarium appears intact. Fractures of the left and right nasal bones with mild displacement of the left nasal bone fracture remain stable. There is a moderate frontal scalp hematoma on the right . Sinuses/Orbits: There is slight mucosal thickening in several ethmoid air cells bilaterally. Visualized paranasal sinuses elsewhere clear. No intraorbital lesions. Note that there is preseptal soft tissue swelling over the right orbit. Other: Mastoid air cells are clear. There is debris in each external auditory canal. IMPRESSION: No intracranial mass, hemorrhage, or extra-axial fluid collection. No acute infarct. There is mild atrophy with mild patchy periventricular  small vessel disease, stable. Right frontal scalp hematoma. Preseptal soft tissue swelling over right orbit. Nasal fractures, stable. Probable cerumen in each external auditory canal. Areas of arterial vascular calcification noted. Mucosal thickening in several ethmoid air cells. Electronically Signed   By: Bretta BangWilliam  Woodruff III M.D.   On: 03/09/2017 07:45   Ct Head Wo Contrast  Result Date: 03/09/2017 CLINICAL DATA:  81 year old female with fall and facial laceration. EXAM: CT HEAD WITHOUT CONTRAST CT MAXILLOFACIAL WITHOUT CONTRAST TECHNIQUE: Multidetector CT imaging of the head and maxillofacial structures were performed using the standard  protocol without intravenous contrast. Multiplanar CT image reconstructions of the maxillofacial structures were also generated. COMPARISON:  None. FINDINGS: CT HEAD FINDINGS Brain: There is mild age-related atrophy and chronic microvascular ischemic changes. There is no acute intracranial hemorrhage. No mass effect or midline shift noted. No extra-axial fluid collection. Vascular: No hyperdense vessel or unexpected calcification. Skull: Normal. Negative for fracture or focal lesion. Other: Right forehead hematoma. CT MAXILLOFACIAL FINDINGS Osseous: Minimally displaced fractures of the nasal bone with mild deviation of the nose to the left. No other acute facial bone fractures noted. There is no dislocation. The bones are osteopenic. Orbits: The globes and retro-orbital fat are preserved. Bilateral cataract surgeries noted. Sinuses: The visualized paranasal sinuses and mastoid air cells are clear. Cerumen noted in the external auditory canal bilaterally. Soft tissues: Mild soft tissue swelling of the nose as well as right forehead hematoma. IMPRESSION: 1. No acute intracranial hemorrhage. Mild age-related atrophy and chronic microvascular ischemic changes. 2. Minimally displaced fractures of the nasal bone with mild deviation of the nose to the left. Electronically Signed   By: Elgie CollardArash  Radparvar M.D.   On: 03/09/2017 04:44   Ct Maxillofacial Wo Cm  Result Date: 03/09/2017 CLINICAL DATA:  81 year old female with fall and facial laceration. EXAM: CT HEAD WITHOUT CONTRAST CT MAXILLOFACIAL WITHOUT CONTRAST TECHNIQUE: Multidetector CT imaging of the head and maxillofacial structures were performed using the standard protocol without intravenous contrast. Multiplanar CT image reconstructions of the maxillofacial structures were also generated. COMPARISON:  None. FINDINGS: CT HEAD FINDINGS Brain: There is mild age-related atrophy and chronic microvascular ischemic changes. There is no acute intracranial hemorrhage. No  mass effect or midline shift noted. No extra-axial fluid collection. Vascular: No hyperdense vessel or unexpected calcification. Skull: Normal. Negative for fracture or focal lesion. Other: Right forehead hematoma. CT MAXILLOFACIAL FINDINGS Osseous: Minimally displaced fractures of the nasal bone with mild deviation of the nose to the left. No other acute facial bone fractures noted. There is no dislocation. The bones are osteopenic. Orbits: The globes and retro-orbital fat are preserved. Bilateral cataract surgeries noted. Sinuses: The visualized paranasal sinuses and mastoid air cells are clear. Cerumen noted in the external auditory canal bilaterally. Soft tissues: Mild soft tissue swelling of the nose as well as right forehead hematoma. IMPRESSION: 1. No acute intracranial hemorrhage. Mild age-related atrophy and chronic microvascular ischemic changes. 2. Minimally displaced fractures of the nasal bone with mild deviation of the nose to the left. Electronically Signed   By: Elgie CollardArash  Radparvar M.D.   On: 03/09/2017 04:44    Procedures Procedures (including critical care time)  Medications Ordered in ED Medications - No data to display   Initial Impression / Assessment and Plan / ED Course  I have reviewed the triage vital signs and the nursing notes.  Pertinent labs results that were available during my care of the patient were reviewed by me and considered in my medical decision making (  see chart for details).       4:08 AM PT SEEN ON ARRIVAL AS LEVEL 1 TRAUMA DR WAKEFIELD AT BEDSIDE LACERATION ACTIVELY BLEEDING - THIS WAS REPAIRED BY DR WAKEFIELD BLEEDING CONTROLLED IMAGING/LABS ORDERED 6:32 AM Pt had abrupt change in mental status She is confused and agitated CBG >100 She is not febrile Initial CT head negative Will need repeat CT head She also has left wrist tenderness.  Will check urinalysis and left wrist/CXR She will need admission BP 119/62   Pulse 83   Temp 99.8 F (37.7  C) (Rectal)   Resp (!) 24   Ht 5\' 6"  (1.676 m)   Wt 54.4 kg   SpO2 100%   BMI 19.37 kg/m  8:00 AM Repeat CT head negative CXR negative She has distal radius fx - will need splint and outpatient hand surgery followup She can also f/u as outpatient for nasal fracture  Pt is still confused on my exam.  She has no gross motor deficits but she has difficulty speaking and appears to have some sort of delirium CVA is possible but she would not be TPA candidate due to head trauma  8:21 AM Pt is followed by family medicine They will admit patient  Final Clinical Impressions(s) / ED Diagnoses   Final diagnoses:  Delirium  Laceration of forehead, initial encounter  Concussion with loss of consciousness, initial encounter  Other closed extra-articular fracture of distal end of left radius, initial encounter  Closed fracture of nasal bone, initial encounter    New Prescriptions New Prescriptions   No medications on file   I personally performed the services described in this documentation, which was scribed in my presence. The recorded information has been reviewed and is accurate.        Zadie Rhine, MD 03/09/17 [phone]

## 2017-03-09 NOTE — Progress Notes (Signed)
Orthopedic Tech Progress Note Patient Details:  Olivia EndsMarion Alma Robertson [birth year] [id number]  Ortho Devices Type of Ortho Device: Arm sling, Ace wrap, Sugartong splint Ortho Device/Splint Interventions: Application   Saul FordyceJennifer C Candra Wegner 03/09/2017, 8:53 AM

## 2017-03-09 NOTE — H&P (Signed)
Family Medicine Teaching [facility] Admission History and Physical Service Pager: [phone]  Patient name: Olivia Robertson Medical record number: [id number] Date of birth: 1936/11/01 Age: 81 y.o. Gender: female  Primary Care Provider: Mickie Hillier, MD Consultants: none Code Status: Partial: no CPR but yes to intubation   Chief Complaint: fall, head lac, AMS  Assessment and Plan: Danaye Sobh is a 81 y.o. female presenting with fall, bleeding R eyebrow lac, and AMS . PMH is significant for anxiety, unspecified neurological deficit for which patient refused further workup, hx of TIA (out of state, no records), and white coat hypertension.  AMS: Patient unable to answer orientation questions on my exam. CT head x 2 without acute abnormalities. No focal neurological deficits.  Noted history of memory issues per neurology notes. EtOH negative. On re-examination, family is at bedside and state that patient is at her baseline. They state that she has had trouble finding words for 6 months, and that she gets frustrated by these. Etiology includes stroke, hypotension, anxiety, acute delirium, underlying dementia. -admit to tele, Dr. Pollie Meyer attending, inpatient status -bed rest while acutely altered -fall precautions -ammonia -TSH -tylenol level  - UDS -regular diet, patient passed bedside swallow  -consider MRI if symptoms do not improve  Fall: Completely unknown etiology as patient is currently altered and unable to give any history of event, other that stating that she "passed out." Possible etiologies include arrthymia, hypoperfusion 2/2 facial lac, vasovagal, stroke, orthstatic hypotension, seizure, mechanical fall, . She did tell ED MD that she slipped in the bathroom 2 hours prior to presentation, but is now altered and cannot confirm this. Per ED RN notes, neighbor called EMS. We are continuing to try to contact neighbor. Patient reported that she has children in [place] but  cannot give contact information. -CK as we don't know how long she was down -TSH -tele as above -echo -trop once, trend if elevated  UTI: Patient without tenderness on exam, but meeting McGeer's criteria due to leukocytosis and gross hematuria (urine red on collection while I was present). Will treat as UTI, this is another possible source of AMS in an elderly patient -treat with PO Macrobid (ok with pharmacy) - urine culture  L forehead lac: Sutured by surgery in ED. EMS reported arterial bleeding. -bacitracin -CBC now as baseline 14.3 per chart review, repeat CBC in am  Distal R radius fracture: seen on XR wrist. Nondisplaced fracture vs osteoporosis.  -splint -outpatient ortho f/u  Nasal fracture: fracture of left and right nasal bones with mild displacement of the left nasal bone stable.  -pain control with tylenol  Thumb pain: pain and extreme flexion of L thumb, likely secondary to fall. -thumb XR  HTN: no home BP meds.  HLD: home med is lipitor.  -hold lipitor pending CK  Hx of PreDM: A1c 5.7 in 02/2015 - repeat A1c  Hx of TIA, neurological deficit: seen in 07/2016 by neurology for hx of TIA and hand tremor, impression was that cognitive and physical symptoms are related to PTSD and tremor appears psychogenic. Recommended neuropsych testing at that time. Last follow up with neurology was 08/2016, unable to conduct neuropsych testing due to persistent acute anxiety.  -continue to monitor mental status  FEN/GI: regular diet, CBGs ACHS Prophylaxis: SCDs   Disposition: admit to tele  History of Present Illness:  Olivia Robertson is a 81 y.o. female presenting via EMS after fall at home, including bleeding head lac and radial fracture. On initial assessment, patient  is altered and unable to answer orientation questions, and denies pain. She states that she passed out, but otherwise is unable to specify time or situation. She tells that she has children but is unable to  further specify.  On reassessment, both son and daughter present at bedside. Son sees patient multiple times per week to provide her with food and check on her in her independent apartment. He reports that she has had trouble finding her words for at least 6 months. Additionally, she has history of bilateral cataracts which were repaired in December and January. Prior to these repairs she had had several falls due to vision impairment. They report that she has undiagnosed memory issues, and gets irritated when she does not have food. They report that she is able to do her own laundry at home. They also importantly reports that her current mental status is at her baseline. Son also reports of increased frequency of urination recently.  In the ED, patient presented via EMS after stabilization of bleeding in her home. Initial SBP by EMS 75. CT head ordered finding minimally displaced fractures of the nasal bone. Surgery consulted to suture right eyebrow lac patient patient became acutely altered, and CT head was repeated given possibility of head bleed. No new findings were noted. Additionally, fracture of the distal right radius was discovered. Notably, leukocytosis to 17, as well as urine with leukocytes, hemoglobin, many bacteria. Vital signs stable on presentation to the ED.   Review Of Systems: Per HPI with the following additions: Patient denies pain, is not able to further specify any review of systems.   ROS  There are no active problems to display for this patient.   Past Medical History: Past Medical History:  Diagnosis Date  . Anxiety     Past Surgical History: History reviewed. No pertinent surgical history.  Social History: Social History  Substance Use Topics  . Smoking status: Unknown If Ever Smoked  . Smokeless tobacco: Not on file  . Alcohol use Not on file   Additional social history: lives alone, has a cat. Patient reports children but can't give any more details.    Please also refer to relevant sections of EMR.  Family History: No family history on file. Patient acutely altered and unable to give any further history.  Allergies and Medications: Allergies  Allergen Reactions  . Penicillins Hives   No current facility-administered medications on file prior to encounter.    No current outpatient prescriptions on file prior to encounter.    Objective: BP 127/78 (BP Location: Left Arm)   Pulse 90   Temp 99.8 F (37.7 C) (Rectal)   Resp (!) 24   Ht 5\' 6"  (1.676 m)   Wt 120 lb (54.4 kg)   LMP  (LMP Unknown)   SpO2 100%   BMI 19.37 kg/m  Exam: General: Thin female resting in stretcher, tremulous, dried blood on face and all extremities.  Eyes: Bruising below bilateral eyes, as well as over right upper eyelid. EOMI, PEERLA ENTM: Moist mucous membrane Neck: No lymphadenopathy or JVD Cardiovascular: RRR, no murmur Respiratory: CTAB, increased RR with patient seeming anxious Gastrointestinal: SNTND, +BS MSK: dried blood over all extremities, good peripheral pulses in all 4, tender over L wrist Derm: no rashes, sutured wound over R eyebrow with dried blood Neuro: patient unable to participate in neuro exam, although voluntarily moving all extremities  Psych: Limited interaction, mostly answers yes or no questions   Labs and Imaging: CBC BMET  Recent Labs Lab 03/09/17 0333  WBC 17.0*  HGB 11.4*  HCT 35.6*  PLT 202    Recent Labs Lab 03/09/17 0333  NA 140  K 4.0  CL 105  CO2 23  BUN 34*  CREATININE 0.76  GLUCOSE 243*  CALCIUM 9.1    UA: large hgb, 30 protein, large Leukocytes, WBC 6-30, many bacteria, 0-5 squamous cells Ethanol <5 EKG: NSR, no ST or T wave changes   Dg Chest 2 View  Result Date: 03/09/2017 CLINICAL DATA:  Pain following fall EXAM: CHEST  2 VIEW COMPARISON:  None. FINDINGS: There is no edema or consolidation. Heart is upper normal in size with pulmonary vascularity within normal limits. No adenopathy.  There is calcification in the mitral annulus. There is aortic atherosclerosis. No pneumothorax. Bones osteoporotic. No fracture evident. IMPRESSION: No edema or consolidation. Aortic atherosclerosis. No pneumothorax. Electronically Signed   By: Bretta Bang III M.D.   On: 03/09/2017 07:41   Dg Wrist Complete Left  Result Date: 03/09/2017 CLINICAL DATA:  Pain following fall EXAM: LEFT WRIST - COMPLETE 3+ VIEW COMPARISON:  None. FINDINGS: Frontal, oblique, lateral, and ulnar deviation scaphoid images were obtained. There is a subtle transverse lucency in the distal radial metaphysis, concerning for a subtle nondisplaced fracture in this area. No other evidence suggesting fracture. No dislocation. There is extensive osteoarthritic change in the scaphotrapezial and first carpal -metacarpal joints. No erosive change. Bones are osteoporotic. IMPRESSION: Subtle transverse lucency distal radial metaphysis, concerning for nondisplaced fracture in this area. Note that the adjacent pronator quadratus fat pad is not elevated, however. It is possible that this linear lucency represents a prominent nutrient foramen in this patient with underlying osteoporosis. If patient is focally tender in the distal radial metaphysis region, it would be prudent to treat this area as a nondisplaced fracture. No other evidence of potential fracture. No dislocation. There is osteoarthritic change, most marked in the scaphotrapezial and first carpal -metacarpal regions. Electronically Signed   By: Bretta Bang III M.D.   On: 03/09/2017 07:40   Ct Head Wo Contrast  Result Date: 03/09/2017 CLINICAL DATA:  Pain following fall. Progression of altered mental status EXAM: CT HEAD WITHOUT CONTRAST TECHNIQUE: Contiguous axial images were obtained from the base of the skull through the vertex without intravenous contrast. COMPARISON:  Study obtained earlier in the day FINDINGS: Brain: Mild diffuse atrophy is stable. There is no  intracranial mass, hemorrhage, extra-axial fluid collection, or midline shift. Mild small vessel disease in the centra semiovale bilaterally is stable. No evident acute infarct. Vascular: No hyperdense vessel. There is calcification in each carotid siphon. Skull: The bony calvarium appears intact. Fractures of the left and right nasal bones with mild displacement of the left nasal bone fracture remain stable. There is a moderate frontal scalp hematoma on the right . Sinuses/Orbits: There is slight mucosal thickening in several ethmoid air cells bilaterally. Visualized paranasal sinuses elsewhere clear. No intraorbital lesions. Note that there is preseptal soft tissue swelling over the right orbit. Other: Mastoid air cells are clear. There is debris in each external auditory canal. IMPRESSION: No intracranial mass, hemorrhage, or extra-axial fluid collection. No acute infarct. There is mild atrophy with mild patchy periventricular small vessel disease, stable. Right frontal scalp hematoma. Preseptal soft tissue swelling over right orbit. Nasal fractures, stable. Probable cerumen in each external auditory canal. Areas of arterial vascular calcification noted. Mucosal thickening in several ethmoid air cells. Electronically Signed   By: Bretta Bang III M.D.  On: 03/09/2017 07:45   Ct Head Wo Contrast  Result Date: 03/09/2017 CLINICAL DATA:  81 year old female with fall and facial laceration. EXAM: CT HEAD WITHOUT CONTRAST CT MAXILLOFACIAL WITHOUT CONTRAST TECHNIQUE: Multidetector CT imaging of the head and maxillofacial structures were performed using the standard protocol without intravenous contrast. Multiplanar CT image reconstructions of the maxillofacial structures were also generated. COMPARISON:  None. FINDINGS: CT HEAD FINDINGS Brain: There is mild age-related atrophy and chronic microvascular ischemic changes. There is no acute intracranial hemorrhage. No mass effect or midline shift noted. No  extra-axial fluid collection. Vascular: No hyperdense vessel or unexpected calcification. Skull: Normal. Negative for fracture or focal lesion. Other: Right forehead hematoma. CT MAXILLOFACIAL FINDINGS Osseous: Minimally displaced fractures of the nasal bone with mild deviation of the nose to the left. No other acute facial bone fractures noted. There is no dislocation. The bones are osteopenic. Orbits: The globes and retro-orbital fat are preserved. Bilateral cataract surgeries noted. Sinuses: The visualized paranasal sinuses and mastoid air cells are clear. Cerumen noted in the external auditory canal bilaterally. Soft tissues: Mild soft tissue swelling of the nose as well as right forehead hematoma. IMPRESSION: 1. No acute intracranial hemorrhage. Mild age-related atrophy and chronic microvascular ischemic changes. 2. Minimally displaced fractures of the nasal bone with mild deviation of the nose to the left. Electronically Signed   By: Elgie CollardArash  Radparvar M.D.   On: 03/09/2017 04:44   Ct Maxillofacial Wo Cm  Result Date: 03/09/2017 CLINICAL DATA:  81 year old female with fall and facial laceration. EXAM: CT HEAD WITHOUT CONTRAST CT MAXILLOFACIAL WITHOUT CONTRAST TECHNIQUE: Multidetector CT imaging of the head and maxillofacial structures were performed using the standard protocol without intravenous contrast. Multiplanar CT image reconstructions of the maxillofacial structures were also generated. COMPARISON:  None. FINDINGS: CT HEAD FINDINGS Brain: There is mild age-related atrophy and chronic microvascular ischemic changes. There is no acute intracranial hemorrhage. No mass effect or midline shift noted. No extra-axial fluid collection. Vascular: No hyperdense vessel or unexpected calcification. Skull: Normal. Negative for fracture or focal lesion. Other: Right forehead hematoma. CT MAXILLOFACIAL FINDINGS Osseous: Minimally displaced fractures of the nasal bone with mild deviation of the nose to the left. No  other acute facial bone fractures noted. There is no dislocation. The bones are osteopenic. Orbits: The globes and retro-orbital fat are preserved. Bilateral cataract surgeries noted. Sinuses: The visualized paranasal sinuses and mastoid air cells are clear. Cerumen noted in the external auditory canal bilaterally. Soft tissues: Mild soft tissue swelling of the nose as well as right forehead hematoma. IMPRESSION: 1. No acute intracranial hemorrhage. Mild age-related atrophy and chronic microvascular ischemic changes. 2. Minimally displaced fractures of the nasal bone with mild deviation of the nose to the left. Electronically Signed   By: Elgie CollardArash  Radparvar M.D.   On: 03/09/2017 04:44    Garth BignessKathryn Timberlake, MD 03/09/2017, 8:15 AM PGY-1, [facility] FPTS Intern pager: [phone], text pages welcome  UPPER LEVEL ADDENDUM  I have read the above note and made revisions highlighted in blue.  Palma HolterKanishka G Dalina Samara, MD PGY-2 [facility] Pager [phone]

## 2017-03-09 NOTE — ED Notes (Signed)
Admitting paged again per family request.

## 2017-03-09 NOTE — Progress Notes (Signed)
Chaplain responded to page for trauma.  Chaplain is available should family arrive.  Beryl MeagerMartin, Alexei Ey G. Chaplain

## 2017-03-09 NOTE — Progress Notes (Signed)
Orthopedic Tech Progress Note Patient Details:  Olivia EndsMarion Alma Robertson [birth year] [id number]  Ortho Devices Type of Ortho Device: Ace wrap, Sugartong splint Ortho Device/Splint Location: Reapplied Sugartong splint. Patient confused and trying to remove existing splint. Ortho Device/Splint Interventions: Application   Saul FordyceJennifer C Rital Cavey 03/09/2017, 3:59 PM

## 2017-03-09 NOTE — ED Triage Notes (Signed)
Per EMS, pt got up to go to the bathroom and fell reported that she "hit my head on the floor"  Unknown if LOC but there was substantial bleeding from a lac above her right eye.  It took time for her to get EMS but was finally able to attract attention of her neighbor who called.  Pt initially was Hypotensive at 75 systolic but upon arrival she was 107/60 after 300cc NS.  Pt is only on ASA.

## 2017-03-10 ENCOUNTER — Encounter (HOSPITAL_COMMUNITY): Payer: Self-pay

## 2017-03-10 DIAGNOSIS — F0390 Unspecified dementia without behavioral disturbance: Secondary | ICD-10-CM | POA: Diagnosis not present

## 2017-03-10 DIAGNOSIS — N390 Urinary tract infection, site not specified: Secondary | ICD-10-CM | POA: Diagnosis not present

## 2017-03-10 DIAGNOSIS — R7303 Prediabetes: Secondary | ICD-10-CM | POA: Diagnosis not present

## 2017-03-10 DIAGNOSIS — S0181XA Laceration without foreign body of other part of head, initial encounter: Secondary | ICD-10-CM | POA: Diagnosis not present

## 2017-03-10 DIAGNOSIS — S52552A Other extraarticular fracture of lower end of left radius, initial encounter for closed fracture: Secondary | ICD-10-CM | POA: Diagnosis not present

## 2017-03-10 DIAGNOSIS — R410 Disorientation, unspecified: Secondary | ICD-10-CM | POA: Diagnosis not present

## 2017-03-10 DIAGNOSIS — S52592A Other fractures of lower end of left radius, initial encounter for closed fracture: Secondary | ICD-10-CM | POA: Diagnosis not present

## 2017-03-10 DIAGNOSIS — S022XXA Fracture of nasal bones, initial encounter for closed fracture: Secondary | ICD-10-CM | POA: Diagnosis not present

## 2017-03-10 DIAGNOSIS — W19XXXA Unspecified fall, initial encounter: Secondary | ICD-10-CM

## 2017-03-10 DIAGNOSIS — B961 Klebsiella pneumoniae [K. pneumoniae] as the cause of diseases classified elsewhere: Secondary | ICD-10-CM | POA: Diagnosis not present

## 2017-03-10 DIAGNOSIS — I959 Hypotension, unspecified: Secondary | ICD-10-CM | POA: Diagnosis not present

## 2017-03-10 DIAGNOSIS — S52502A Unspecified fracture of the lower end of left radius, initial encounter for closed fracture: Secondary | ICD-10-CM

## 2017-03-10 DIAGNOSIS — Z79899 Other long term (current) drug therapy: Secondary | ICD-10-CM | POA: Diagnosis not present

## 2017-03-10 DIAGNOSIS — Z681 Body mass index (BMI) 19 or less, adult: Secondary | ICD-10-CM | POA: Diagnosis not present

## 2017-03-10 DIAGNOSIS — D62 Acute posthemorrhagic anemia: Secondary | ICD-10-CM | POA: Diagnosis not present

## 2017-03-10 LAB — BASIC METABOLIC PANEL
ANION GAP: 7 (ref 5–15)
BUN: 21 mg/dL — ABNORMAL HIGH (ref 6–20)
CHLORIDE: 109 mmol/L (ref 101–111)
CO2: 23 mmol/L (ref 22–32)
Calcium: 8.3 mg/dL — ABNORMAL LOW (ref 8.9–10.3)
Creatinine, Ser: 0.63 mg/dL (ref 0.44–1.00)
GFR calc non Af Amer: >60 mL/min (ref >60)
GLUCOSE: 112 mg/dL — AB (ref 65–99)
POTASSIUM: 3.5 mmol/L (ref 3.5–5.1)
Sodium: 139 mmol/L (ref 135–145)

## 2017-03-10 LAB — TROPONIN I: Troponin I: 0.05 ng/mL (ref <0.03)

## 2017-03-10 LAB — CBC
HCT: 29.9 % — ABNORMAL LOW (ref 36.0–46.0)
HEMATOCRIT: 29.2 % — AB (ref 36.0–46.0)
HEMOGLOBIN: 9.9 g/dL — AB (ref 12.0–15.0)
Hemoglobin: 9.6 g/dL — ABNORMAL LOW (ref 12.0–15.0)
MCH: 28.3 pg (ref 26.0–34.0)
MCH: 28.9 pg (ref 26.0–34.0)
MCHC: 32.9 g/dL (ref 30.0–36.0)
MCHC: 33.1 g/dL (ref 30.0–36.0)
MCV: 86.1 fL (ref 78.0–100.0)
MCV: 87.2 fL (ref 78.0–100.0)
Platelets: 167 10*3/uL (ref 150–400)
Platelets: 154 10*3/uL (ref 150–400)
RBC: 3.39 MIL/uL — ABNORMAL LOW (ref 3.87–5.11)
RBC: 3.43 MIL/uL — AB (ref 3.87–5.11)
RDW: 14.7 % (ref 11.5–15.5)
RDW: 15 % (ref 11.5–15.5)
WBC: 8.4 10*3/uL (ref 4.0–10.5)
WBC: 6.6 10*3/uL (ref 4.0–10.5)

## 2017-03-10 LAB — GLUCOSE, CAPILLARY
Glucose-Capillary: 128 mg/dL — ABNORMAL HIGH (ref 65–99)
Glucose-Capillary: 117 mg/dL — ABNORMAL HIGH (ref 65–99)

## 2017-03-10 LAB — HEMOGLOBIN A1C
HEMOGLOBIN A1C: 5.9 % — AB (ref 4.8–5.6)
MEAN PLASMA GLUCOSE: 123 mg/dL

## 2017-03-10 LAB — ABO/RH: ABO/RH(D): O POS

## 2017-03-10 NOTE — Evaluation (Signed)
Physical Therapy Evaluation Patient Details Name: Olivia Robertson MRN: [id number] DOB: 02-27-1936 Today's Date: 03/10/2017   History of Present Illness  Olivia Robertson is a 81 y.o. female presenting with fall, L distal radial fx, bleeding R eyebrow lac, and AMS . PMH is significant for anxiety, unspecified neurological deficit for which patient refused further workup, hx of TIA (out of state, no records), and white coat hypertension.  Clinical Impression  Pt admitted with above diagnosis. Pt currently with functional limitations due to the deficits listed below (see PT Problem List). Overall unsteady with mobility, requiring mod assist for transfers; would like to work with L platform RW versus cane with amb; Decr standing tolerance, and therefore unable to obtain full set of orthostatic vitals; plan to retry next session; I favor SNF for rehab to maximize independence and safety with mobility;  Pt will benefit from skilled PT to increase their independence and safety with mobility to allow discharge to the venue listed below.    Are we able to weight bear through L elbow? Please clarify in orders     Follow Up Recommendations SNF    Equipment Recommendations  Other (comment) (L latform)    Recommendations for Other Services Other (comment) (SW for assist with )     Precautions / Restrictions Precautions Precautions: Fall Required Braces or Orthoses: Other Brace/Splint Other Brace/Splint: LUE splinted Restrictions Weight Bearing Restrictions: Yes LUE Weight Bearing: Weight bear through elbow only Other Position/Activity Restrictions: Will proceed with L wrist NWB unless otherwise ordered      Mobility  Bed Mobility Overal bed mobility: Needs Assistance Bed Mobility: Supine to Sit     Supine to sit: Mod assist     General bed mobility comments: Cues for technqiue; Mod assist to elevate trunk to sit  Transfers Overall transfer level: Needs assistance Equipment  used: 1 person hand held assist;2 person hand held assist Transfers: Sit to/from [employer] Sit to Stand: Mod assist Stand pivot transfers: Mod assist       General transfer comment: Mod assist to power up and mod assist to steady during pivotal steps bed to recliner to [place] and back to recliner  Ambulation/Gait Ambulation/Gait assistance: Mod assist Ambulation Distance (Feet): 3 Feet (x2; pivot steps during transfers) Assistive device: 1 person hand held assist;2 person hand held assist Gait Pattern/deviations: Shuffle     General Gait Details: Very nervous, and fearful of falling; short, staccato steps; needs outside support for steadiness, given at bil elbows  Stairs            Wheelchair Mobility    Modified Rankin (Stroke Patients Only)       Balance Overall balance assessment: Needs assistance Sitting-balance support: Feet supported;Single extremity supported Sitting balance-Leahy Scale: Poor (approaching Fair) Sitting balance - Comments: One instance of losing balance posteriorly requiring physical support to recover to upright sitting Postural control: Posterior lean Standing balance support: Bilateral upper extremity supported (support at elbows) Standing balance-Leahy Scale: Poor Standing balance comment: decr standing tolerance; unable to obtain full orthostatics                             Pertinent Vitals/Pain Pain Assessment: Faces Pain Score: 3  Faces Pain Scale: Hurts even more Pain Location: wrist - states not pain in head Pain Descriptors / Indicators: Grimacing;Guarding Pain Intervention(s): Monitored during session;Repositioned    Home Living Family/patient expects to be discharged to:: Private residence Living Arrangements:  Alone (has one indoor cat) Available Help at Discharge: Family;Available PRN/intermittently (they work during the day) Type of Home: Apartment Home Access: Level entry     Home Layout: One  level Home Equipment: Walker - 2 wheels;Shower seat;Cane - single point Additional Comments: does not drive but doing IADL, ADL    Prior Function Level of Independence: Independent with assistive device(s)         Comments: recently got a cane     Hand Dominance   Dominant Hand: Right    Extremity/Trunk Assessment   Upper Extremity Assessment Upper Extremity Assessment: Defer to OT evaluation (LUE in sugartong splint)    Lower Extremity Assessment Lower Extremity Assessment: Generalized weakness       Communication   Communication: Expressive difficulties  Cognition Arousal/Alertness: Awake/alert Behavior During Therapy: Restless Overall Cognitive Status: No family/caregiver present to determine baseline cognitive functioning                                 General Comments: Per chart review son has reported word-finding dificulty over past few months      General Comments      Exercises     Assessment/Plan    PT Assessment Patient needs continued PT services  PT Problem List Decreased strength;Decreased range of motion;Decreased activity tolerance;Decreased balance;Decreased mobility;Decreased coordination;Decreased knowledge of use of DME;Decreased safety awareness;Decreased knowledge of precautions;Cardiopulmonary status limiting activity;Pain;Decreased skin integrity       PT Treatment Interventions DME instruction;Gait training;Stair training;Functional mobility training;Therapeutic activities;Therapeutic exercise;Balance training;Neuromuscular re-education;Cognitive remediation;Patient/family education    PT Goals (Current goals can be found in the Care Plan section)  Acute Rehab PT Goals Patient Stated Goal: be able to go home PT Goal Formulation: With patient Time For Goal Achievement: 03/24/17 Potential to Achieve Goals: Good    Frequency Min 3X/week   Barriers to discharge        Co-evaluation               End of  Session   Activity Tolerance: Patient tolerated treatment well Patient left: in chair;with call bell/phone within reach;with chair alarm set Nurse Communication: Mobility status PT Visit Diagnosis: Unsteadiness on feet (R26.81);Repeated falls (R29.6);Pain Pain - Right/Left: Left Pain - part of body: Arm    Time: [id number] PT Time Calculation (min) (ACUTE ONLY): 34 min   Charges:   PT Evaluation $PT Eval Moderate Complexity: 1 Procedure     PT G Codes:        Van ClinesHolly Mackey Varricchio, PT  Acute Rehabilitation Services Pager [phone] Office [phone]   Levi AlandHolly H Raynor Calcaterra 03/10/2017, 1:50 PM

## 2017-03-10 NOTE — Evaluation (Addendum)
Occupational Therapy Evaluation Patient Details Name: Olivia Robertson MRN: [id number] DOB: 01-May-1936 Today's Date: 03/10/2017    History of Present Illness Olivia Robertson is a 81 y.o. female presenting with fall, L distal radial fx, bleeding R eyebrow lac, and AMS . PMH is significant for anxiety, unspecified neurological deficit for which patient refused further workup, hx of TIA (out of state, no records), and white coat hypertension.   Clinical Impression   PTA Pt independent in ADL and mobility with SPC. Pt currently max A for ADL and mod A for stand pivot transfers. Pt presenting with deficits as listed below in OT problem list. Pt will benefit from skilled OT in the acute setting to maximize safety and independence in ADL as well as education for compensatory strategies. Pt will require SNF level therapy to maximize independence. Also recommending SLP eval due to expressive difficulties. Next session to focus on compensatory strategies for grooming and upper body ADL.    Follow Up Recommendations  SNF;Supervision/Assistance - 24 hour    Equipment Recommendations  Other (comment) (defer to next venue)    Recommendations for Other Services Speech consult     Precautions / Restrictions Precautions Precautions: Fall Required Braces or Orthoses: Other Brace/Splint Other Brace/Splint: LUE in Sugartong splint with ace bandage Restrictions Weight Bearing Restrictions: Yes LUE Weight Bearing: Weight bear through elbow only Other Position/Activity Restrictions: Will proceed with L wrist NWB unless otherwise ordered      Mobility Bed Mobility Overal bed mobility: Needs Assistance Bed Mobility: Supine to Sit     Supine to sit: Mod assist     General bed mobility comments: Cues for technqiue; Mod assist to elevate trunk to sit  Transfers Overall transfer level: Needs assistance Equipment used: 1 person hand held assist;2 person hand held assist Transfers: Sit  to/from [employer]GI CorporationStand;Stand Pivot Transfers Sit to Stand: Mod assist Stand pivot transfers: Mod assist       General transfer comment: Mod assist to power up and mod assist to steady during pivotal steps bed to recliner to [facility] and back to recliner    Balance Overall balance assessment: Needs assistance Sitting-balance support: Feet supported;Single extremity supported Sitting balance-Leahy Scale: Poor Sitting balance - Comments: One instance of losing balance posteriorly requiring physical support to recover to upright sitting Postural control: Posterior lean Standing balance support: Bilateral upper extremity supported (support at elbows) Standing balance-Leahy Scale: Poor Standing balance comment: decr standing tolerance; unable to obtain full orthostatics                           ADL either performed or assessed with clinical judgement   ADL Overall ADL's : Needs assistance/impaired Eating/Feeding: Minimal assistance;Sitting Eating/Feeding Details (indicate cue type and reason): Pt declined all food choices except for banana pudding Grooming: Sitting;Moderate assistance   Upper Body Bathing: Moderate assistance   Lower Body Bathing: Moderate assistance   Upper Body Dressing : Moderate assistance   Lower Body Dressing: Maximal assistance   Toilet Transfer: Moderate assistance;Stand-pivot;BSC Toilet Transfer Details (indicate cue type and reason): vc for Pt to not weight bear through LUE Toileting- Clothing Manipulation and Hygiene: Total assistance;Sit to/from stand       Functional mobility during ADLs: Moderate assistance (1 HHA) General ADL Comments: Pt very frustrated      Vision Baseline Vision/History: Wears glasses Wears Glasses: Reading only Vision Assessment?:  (not assessed this session, trouble following directions) Additional Comments: Lots of brusing and swelling around  the eyes     Perception     Praxis      Pertinent Vitals/Pain Pain  Assessment: Faces Faces Pain Scale: Hurts even more Pain Location: wrist - states not pain in head Pain Descriptors / Indicators: Grimacing;Guarding Pain Intervention(s): Monitored during session;Repositioned     Hand Dominance Right   Extremity/Trunk Assessment Upper Extremity Assessment Upper Extremity Assessment: LUE deficits/detail;Generalized weakness LUE Deficits / Details: LUE in sugartong splint - encouraged elevation and moving fingers  LUE: Unable to fully assess due to immobilization   Lower Extremity Assessment Lower Extremity Assessment: Defer to PT evaluation       Communication Communication Communication: Expressive difficulties   Cognition Arousal/Alertness: Awake/alert Behavior During Therapy: Restless Overall Cognitive Status: No family/caregiver present to determine baseline cognitive functioning                                 General Comments: Per chart review son has reported word-finding dificulty over past few months   General Comments       Exercises     Shoulder Instructions      Home Living Family/patient expects to be discharged to:: Private residence Living Arrangements: Alone (has an indoor cat) Available Help at Discharge: Family;Available PRN/intermittently (they work during the day) Type of Home: Apartment Home Access: Level entry     Home Layout: One level     Bathroom Shower/Tub: Tub/shower unit;Curtain   Firefighter: Standard     Home Equipment: Environmental consultant - 2 wheels;Shower seat;Cane - single point   Additional Comments: does not drive but doing IADL, ADL      Prior Functioning/Environment Level of Independence: Independent with assistive device(s)        Comments: recently got a cane        OT Problem List: Decreased strength;Decreased range of motion;Decreased activity tolerance;Impaired balance (sitting and/or standing);Decreased cognition;Decreased safety awareness;Decreased knowledge of use of  DME or AE;Decreased knowledge of precautions;Impaired UE functional use;Pain      OT Treatment/Interventions: Self-care/ADL training;Therapeutic exercise;Therapeutic activities;Patient/family education;Balance training;Cognitive remediation/compensation    OT Goals(Current goals can be found in the care plan section) Acute Rehab OT Goals Patient Stated Goal: be able to go home OT Goal Formulation: With patient Time For Goal Achievement: 03/24/17 Potential to Achieve Goals: Good ADL Goals Pt Will Perform Upper Body Bathing: with supervision;sitting Pt Will Perform Lower Body Bathing: with min guard assist;sitting/lateral leans;with adaptive equipment Pt Will Transfer to Toilet: with supervision;stand pivot transfer;bedside commode Pt Will Perform Toileting - Clothing Manipulation and hygiene: with modified independence;sit to/from stand Additional ADL Goal #1: Pt will perform bed mobility following weight bearing precautions through LUE  OT Frequency: Min 2X/week   Barriers to D/C: Decreased caregiver support  Pt lives alone       Co-evaluation PT/OT/SLP Co-Evaluation/Treatment: Yes Reason for Co-Treatment: Complexity of the patient's impairments (multi-system involvement);Necessary to address cognition/behavior during functional activity;For patient/therapist safety;To address functional/ADL transfers   OT goals addressed during session: ADL's and self-care      End of Session Equipment Utilized During Treatment: Gait belt Nurse Communication: Mobility status  Activity Tolerance: Patient limited by lethargy Patient left: in chair;with chair alarm set;with call bell/phone within reach  OT Visit Diagnosis: Unsteadiness on feet (R26.81);Pain;Dizziness and giddiness (R42);Other symptoms and signs involving the nervous system (R29.898) Pain - Right/Left: Left Pain - part of body: Arm  Time: [id number] OT Time Calculation (min): 50 min Charges:  OT General  Charges $OT Visit: 1 Procedure OT Evaluation $OT Eval Moderate Complexity: 1 Procedure OT Treatments $Self Care/Home Management : 8-22 mins G-Codes:     Sherryl Manges OTR/L [phone] Evern Bio Yaniah Thiemann 06-Apr-2017, 5:00 PM   Addendum: Fix Spelling Typo in Clinical Impression    04/06/17 1503  OT G-codes **NOT FOR INPATIENT CLASS**  Functional Assessment Tool Used Clinical judgement  Functional Limitation Self care  Self Care Current Status [phone]) CK  Self Care Goal Status ([id number]) CI   Addendum #2: Late G Code Entry

## 2017-03-10 NOTE — Progress Notes (Signed)
Family Medicine Teaching Service Daily Progress Note Intern Pager: [phone]  Patient name: Olivia Robertson Medical record number: [id number] Date of birth: 1936/03/18 Age: 81 y.o. Gender: female  Primary Care Provider: Mickie HillierIan McKeag, MD Consultants: None Code Status: Partial: no CPR but yes to intubation   Pt Overview and Major Events to Date:   Assessment and Plan: Olivia EndsMarion Alma Ketelsen is a 81 y.o. female presenting with fall, bleeding R eyebrow lac, and AMS . PMH is significant for anxiety, unspecified neurological deficit for which patient refused further workup, hx of TIA (out of state, no records), and white coat hypertension.  Resolved AMS: Alert and Orientated x3. Can tell me the name of the president.  CT head x 2 without acute abnormalities. No focal neurological deficits. Family at bedside and state that patient is at her baseline. - fall precautions - ammonia, TSH - wnl  - tylenol level, UDS, EtOH negative   Fall: Possibly a mechanical fall, tripping on rug and then slipping on blood.  Denies any passing out or dizziness prior to the event.  Possible etiologies; cardiac, vs. orthstatic hypotension vs. mechanical fall.  - CK 412 - orthostatics today  - echo pending  - AM EKG pending   - troponin's trending down 0.09>0.07>0,05  UTI: Patient without tenderness on exam, but meeting McGeer's criteria due to leukocytosis and gross hematuria (urine red on collection while I was present). Will treat as UTI, this is another possible source of AMS in an elderly patient - Continue PO Macrobid (ok with pharmacy) - urine culture  Hx of Anxiety  - Currently holding home zoloft- will restart if patient is currently taking daily   L forehead lac: Sutured by surgery in ED. EMS reported arterial bleeding. -bacitracin - hgb baseline 14.3 per chart review, now 9.6   Normocytic Anemia. Likely due to acute blood loss from laceration. Baseline 14.3 - Will watch vitals closely  - CBC  in the afternoon  - Follow up as needed   Distal R radius fracture: seen on XR wrist. Nondisplaced fracture vs osteoporosis.  -splint -outpatient ortho f/u  Nasal fracture: fracture of left and right nasal bones with mild displacement of the left nasal bone stable.  -pain control with tylenol  HTN: no home BP meds.  HLD: home med is lipitor.  -hold lipitor pending CK  Hx of PreDM: A1c 5.7 in 02/2015 - repeat A1c  Hx of TIA, neurological deficit: seen in 07/2016 by neurology for hx of TIA and hand tremor, impression was that cognitive and physical symptoms are related to PTSD and tremor appears psychogenic. Recommended neuropsych testing at that time. Last follow up with neurology was 08/2016, unable to conduct neuropsych testing due to persistent acute anxiety.  -continue to monitor mental status  FEN/GI: regular diet, Prophylaxis: SCDs   Disposition: home   Subjective:  Patient states she much improved from yesterday, overall still in a lot of pain. Feels the tylenol is good enough for now.   Objective: Temp:  [97.8 F (36.6 C)-99.8 F (37.7 C)] 98.8 F (37.1 C) (03/25 0300) Pulse Rate:  [72-94] 81 (03/25 0300) Resp:  [12-28] 18 (03/25 0300) BP: (117-138)/(47-98) 130/62 (03/25 0300) SpO2:  [97 %-100 %] 99 % (03/25 0300) Physical Exam: General: Lying in bed, NAD Skin:  ecchymosis bilateral medial eye sockets Cardiovascular: RRR, no murmurs noted  Respiratory: CTAB, no increased WOB Abdomen: BS+, no ttp  Extremities: No swelling in legs, indicates she finds SCDs uncomfortable. Discussed the importance of keeping  these on.   Laboratory:  Recent Labs Lab 03/09/17 0333 03/09/17 1111 03/10/17 0343  WBC 17.0* 11.0* 8.4  HGB 11.4* 10.7* 9.6*  HCT 35.6* 32.1* 29.2*  PLT 202 188 167    Recent Labs Lab 03/09/17 0333 03/10/17 0343  NA 140 139  K 4.0 3.5  CL 105 109  CO2 23 23  BUN 34* 21*  CREATININE 0.76 0.63  CALCIUM 9.1 8.3*  PROT 5.6*  --   BILITOT  0.8  --   ALKPHOS 72  --   ALT 23  --   AST 28  --   GLUCOSE 243* 112*     Results for Olivia Robertson, Olivia Robertson (MRN [id number]) as of 03/10/2017 09:15  Ref. Range 03/09/2017 09:44 03/09/2017 14:31 03/09/2017 19:56 03/10/2017 03:43  Troponin I Latest Ref Range: <0.03 ng/mL 0.09 (HH) 0.09 (HH) 0.07 (HH) 0.05 (HH)   Imaging/Diagnostic Tests: Dg Finger Thumb Left  Result Date: 03/09/2017 CLINICAL DATA:  Recent fall and left thumb pain. Acute on chronic pain. EXAM: LEFT THUMB 2+V COMPARISON:  Left wrist 03/09/2017 FINDINGS: Hyperextension of the thumb IP joint. Cast or splint on the wrist. No fracture involving the left thumb. Sclerosis and mild degenerative changes at the first carpometacarpal joint. IMPRESSION: No acute bone abnormality involving the left thumb. Hyperextension of the left thumb could be normal for this patient and recommend clinical correlation. Electronically Signed   By: Richarda Overlie M.D.   On: 03/09/2017 14:37    Braelin Brosch Mayra Reel, MD 03/10/2017, 9:15 AM PGY-2, [place] [facility] FPTS Intern pager: [phone], text pages welcome

## 2017-03-11 ENCOUNTER — Inpatient Hospital Stay (HOSPITAL_COMMUNITY): Payer: Medicare Other

## 2017-03-11 DIAGNOSIS — R55 Syncope and collapse: Secondary | ICD-10-CM | POA: Diagnosis not present

## 2017-03-11 DIAGNOSIS — S52592A Other fractures of lower end of left radius, initial encounter for closed fracture: Secondary | ICD-10-CM | POA: Diagnosis not present

## 2017-03-11 DIAGNOSIS — I959 Hypotension, unspecified: Secondary | ICD-10-CM | POA: Diagnosis not present

## 2017-03-11 DIAGNOSIS — S0181XA Laceration without foreign body of other part of head, initial encounter: Secondary | ICD-10-CM | POA: Diagnosis not present

## 2017-03-11 DIAGNOSIS — R7303 Prediabetes: Secondary | ICD-10-CM | POA: Diagnosis not present

## 2017-03-11 DIAGNOSIS — R410 Disorientation, unspecified: Secondary | ICD-10-CM | POA: Diagnosis not present

## 2017-03-11 DIAGNOSIS — D62 Acute posthemorrhagic anemia: Secondary | ICD-10-CM | POA: Diagnosis not present

## 2017-03-11 DIAGNOSIS — Z681 Body mass index (BMI) 19 or less, adult: Secondary | ICD-10-CM | POA: Diagnosis not present

## 2017-03-11 DIAGNOSIS — N390 Urinary tract infection, site not specified: Secondary | ICD-10-CM | POA: Diagnosis not present

## 2017-03-11 DIAGNOSIS — B961 Klebsiella pneumoniae [K. pneumoniae] as the cause of diseases classified elsewhere: Secondary | ICD-10-CM | POA: Diagnosis not present

## 2017-03-11 DIAGNOSIS — Z79899 Other long term (current) drug therapy: Secondary | ICD-10-CM | POA: Diagnosis not present

## 2017-03-11 DIAGNOSIS — F0390 Unspecified dementia without behavioral disturbance: Secondary | ICD-10-CM | POA: Diagnosis not present

## 2017-03-11 LAB — CBC
HCT: 29.9 % — ABNORMAL LOW (ref 36.0–46.0)
Hemoglobin: 9.8 g/dL — ABNORMAL LOW (ref 12.0–15.0)
MCH: 28.8 pg (ref 26.0–34.0)
MCHC: 32.8 g/dL (ref 30.0–36.0)
MCV: 87.9 fL (ref 78.0–100.0)
PLATELETS: 166 10*3/uL (ref 150–400)
RBC: 3.4 MIL/uL — AB (ref 3.87–5.11)
RDW: 15 % (ref 11.5–15.5)
WBC: 6.8 10*3/uL (ref 4.0–10.5)

## 2017-03-11 LAB — BASIC METABOLIC PANEL
ANION GAP: 8 (ref 5–15)
BUN: 14 mg/dL (ref 6–20)
CALCIUM: 8.7 mg/dL — AB (ref 8.9–10.3)
CO2: 25 mmol/L (ref 22–32)
Chloride: 109 mmol/L (ref 101–111)
Creatinine, Ser: 0.62 mg/dL (ref 0.44–1.00)
GFR calc Af Amer: >60 mL/min (ref >60)
Glucose, Bld: 100 mg/dL — ABNORMAL HIGH (ref 65–99)
POTASSIUM: 3.5 mmol/L (ref 3.5–5.1)
SODIUM: 142 mmol/L (ref 135–145)

## 2017-03-11 LAB — URINE CULTURE: Culture: >=100000 — AB

## 2017-03-11 LAB — ECHOCARDIOGRAM COMPLETE
Height: 66 in
Weight: 1920 oz

## 2017-03-11 MED ORDER — SODIUM CHLORIDE 0.9 % IV BOLUS (SEPSIS)
1000.0000 mL | Freq: Once | INTRAVENOUS | Status: AC
  Administered 2017-03-11: 1000 mL via INTRAVENOUS

## 2017-03-11 NOTE — Discharge Summary (Signed)
Family Medicine Teaching Service [facility] Discharge Summary  Patient name: Olivia Robertson Medical record number: [id number] Date of birth: July 24, 1936 Age: 81 y.o. Gender: female Date of Admission: 03/09/2017  Date of Discharge: 03/12/17  Admitting Physician: Latrelle Dodrill, MD  Primary Care Provider: Mickie Hillier, MD Consultants: neurology, surgery in ED for lac  Indication for Hospitalization: fall, head lac, AMS  Discharge Diagnoses/Problem List:  Impaired memory, likely dementia UTI, treated History of anxiety, PTSD Left forehead laceration Normocytic anemia Distal left radius fracture Nasal fracture HLD HTN History of prediabetes  Disposition: SNF  Discharge Condition: Stable  Discharge Exam: See progress note from day of discharge  Brief Hospital Course:  Patient presented via EMS after fall and possible arterial bleed from right for head laceration. When EMS arrived to her home, they reported signs of extensive bleeding, and systolic BP was in the 70s. She was given some fluid repletion and brought in. On initial evaluation the ED, vital signs were stable and patient was mentating normally. Laceration was sutured, and nasal fracture as well as left distal nondisplaced radial fracture were found on imaging. However, patient became acutely altered, resulting in an additional CT head without acute findings. Family medicine was consulted for altered mental status. On our initial assessment, patient was able to give very limited history. Given altered mental status, syncope workup was conducted. Echo showed normal EF and no valvular dysfunction. EKG without signs of ischemia or block suggesting cause for syncope. TSH within normal limits, ammonia level normal. Troponins unremarkable. Tylenol level unremarkable. MRI head was deferred, as patient became more alert when family arrived. Notably, patient's family reports that she has had increasing memory loss and occasional  inability to find words at home for at least 6 months. Prior to presentation, she lives alone and was unable to cook for herself, but her son checked on her multiple times per week and brought her food. Both patient and son subsequently endorsed that the patient was having urinary frequency. Given hematuria and leukocytosis, treated as a UTI with by mouth Macrobid. Urine culture subsequently resulted with greater than 100,000 colonies of Klebsiella, sensitive to Macrobid. Prior to discharge, patient was able to tolerate by mouth medicines and did not have any acute episodes of delirium. Sugar tong splint was changed to thumb spica splint prior to discharge for patient comfort.  Issues for Follow Up:  1. Family notes decreased memory and occasional trouble finding words for 6 months. Would recommend memory testing once acute illness has resolved.  2. Continue Macrobid for a total of 7 days, and date 03/14/2017.  3. Consider long-term placement, given patient's memory issues, concern for safety of living at home alone. 4. Patient may require additional treatment of anxiety, a history significant for PTSD as well as significant anxiety around medical providers. Patient stopped Zoloft several weeks prior to admission, so this was not continued given risk for falls in the elderly with SSRIs. 5. Needs outpatient follow-up of left nondisplaced distal radial fracture. Discharged in thumb spica cast for patient comfort due to mild nature of this fracture.  6. Nylon sutures used to repair had black, will need to be removed in approximately 5 days. 7. Hemoglobin A1c 5.9, given patient's history of falls and likely dementia, no interventions started. Would continue to monitor as clinical course suggests. Would not desire tight CBG control in this patient given falls and age.   Significant Procedures: none  Significant Labs and Imaging:   Recent Labs Lab 03/10/17 0343  03/10/17 1647 03/11/17 0422  WBC 8.4 6.6  6.8  HGB 9.6* 9.9* 9.8*  HCT 29.2* 29.9* 29.9*  PLT 167 154 166    Recent Labs Lab 03/09/17 0333 03/09/17 0944 03/10/17 0343 03/11/17 0422  NA 140  --  139 142  K 4.0  --  3.5 3.5  CL 105  --  109 109  CO2 23  --  23 25  GLUCOSE 243*  --  112* 100*  BUN 34*  --  21* 14  CREATININE 0.76  --  0.63 0.62  CALCIUM 9.1  --  8.3* 8.7*  MG  --  1.8  --   --   ALKPHOS 72  --   --   --   AST 28  --   --   --   ALT 23  --   --   --   ALBUMIN 3.0*  --   --   --     TSH 0.473 CK 412 Mg 1.8 UDS negative Tylenol level 11  Results/Tests Pending at Time of Discharge: none  Discharge Medications:  Allergies as of 03/12/2017      Reactions   Penicillins Hives      Medication List    TAKE these medications   acetaminophen 325 MG tablet Commonly known as:  TYLENOL Take 2 tablets (650 mg total) by mouth every 6 (six) hours as needed for mild pain (or Fever >/= 101).   aspirin EC 81 MG tablet Take 81 mg by mouth daily.   atorvastatin 40 MG tablet Commonly known as:  LIPITOR Take 40 mg by mouth daily.   nitrofurantoin (macrocrystal-monohydrate) 100 MG capsule Commonly known as:  MACROBID Take 1 capsule (100 mg total) by mouth every 12 (twelve) hours.       Discharge Instructions: Please refer to Patient Instructions section of EMR for full details.  Patient was counseled important signs and symptoms that should prompt return to medical care, changes in medications, dietary instructions, activity restrictions, and follow up appointments.   Follow-Up Appointments: Per SNF. Will need outpatient ortho follow up as well as suture removal in 5 days.   Garth BignessKathryn Jozef Eisenbeis, MD 03/12/2017, 11:33 AM PGY-1, [facility]

## 2017-03-11 NOTE — Clinical Social Work Note (Signed)
Clinical Social Work Assessment  Patient Details  Name: Olivia Robertson MRN: [id number] Date of Birth: 1936-09-15  Date of referral:  03/11/17               Reason for consult:  Facility Placement, Discharge Planning                Permission sought to share information with:  Facility Sport and exercise psychologist, Family Supports Permission granted to share information::  Yes, Verbal Permission Granted  Name::     Reegan Mctighe  Agency::  SNF's  Relationship::  Son/HCPOA  Contact Information:  [phone]  Housing/Transportation Living arrangements for the past 2 months:  Apartment Source of Information:  Patient, Medical Team, Adult Children Patient Interpreter Needed:  None Criminal Activity/Legal Involvement Pertinent to Current Situation/Hospitalization:  No - Comment as needed Significant Relationships:  Adult Children Lives with:  Self Do you feel safe going back to the place where you live?  Yes Need for family participation in patient care:  Yes (Comment)  Care giving concerns:  PT recommending SNF once medically stable for discharge.   Social Worker assessment / plan:  CSW met with patient. No supports at bedside. CSW introduced role and explained that PT recommendations would be discussed. Patient stated that she prefers to go home but will go to SNF if she needs to. Patient stated that her son/HCPOA handles her affairs and gave CSW permission to contact him. Patient's SS# not on file but patient was able to verbally provide: [phone]. PASARR obtained: [id number]. CSW contacted patient's son to discuss SNF. He is agreeable but not familiar with the process. CSW answered all questions. Patient's son stated that his mother prefers to be at home alone and he accomodates that in a way that she continues to be safe. CSW emailed patient's son SNF list for him to review. No further concerns. CSW encouraged patient and her son to contact CSW as needed. CSW will continue to follow  patient and her son for support and facilitate discharge to SNF once medically stable.   Employment status:  Retired Nurse, adult PT Recommendations:  [place] / Referral to community resources:  [place]  Patient/Family's Response to care:  Patient and her son agreeable to SNF placement. Patient's son supportive and involved in patient's care. Patient and her son appreciated social work intervention.  Patient/Family's Understanding of and Emotional Response to Diagnosis, Current Treatment, and Prognosis:  Patient and her son have a good understanding of the reason for admission. Patient's son discussed how she was living prior to fall. Patient and her son appear happy with hospital care.  Emotional Assessment Appearance:  Appears stated age Attitude/Demeanor/Rapport:  Other (Pleasant) Affect (typically observed):  Accepting, Appropriate, Calm, Pleasant Orientation:  Oriented to Self, Oriented to Place, Oriented to Situation Alcohol / Substance use:  Never Used Psych involvement (Current and /or in the community):  No (Comment)  Discharge Needs  Concerns to be addressed:  Care Coordination Readmission within the last 30 days:  No Current discharge risk:  Dependent with Mobility, Lives alone Barriers to Discharge:  Ship broker, Continued Medical Work up   Candie Chroman, LCSW 03/11/2017, 1:11 PM

## 2017-03-11 NOTE — Progress Notes (Signed)
Orthopedic Tech Progress Note Patient Details:  Minerva EndsMarion Alma Ruddell 06/25/1936 [id number]  Ortho Devices Type of Ortho Device: Thumb spica splint Ortho Device/Splint Location: Reapplied Sugartong splint. Patient confused and trying to remove existing splint. Ortho Device/Splint Interventions: Application   Saul FordyceJennifer C Federick Levene 03/11/2017, 2:09 PM

## 2017-03-11 NOTE — Clinical Social Work Placement (Signed)
   CLINICAL SOCIAL WORK PLACEMENT  NOTE  Date:  03/11/2017  Patient Details  Name: Minerva EndsMarion Alma Carcione MRN: [id number] Date of Birth: 03/16/1936  Clinical Social Work is seeking post-discharge placement for this patient at the Skilled  Nursing Facility level of care (*CSW will initial, date and re-position this form in  chart as items are completed):  Yes   Patient/family provided with [facility] Clinical Social Work Department's list of facilities offering this level of care within the geographic area requested by the patient (or if unable, by the patient's family).  Yes   Patient/family informed of their freedom to choose among providers that offer the needed level of care, that participate in Medicare, Medicaid or managed care program needed by the patient, have an available bed and are willing to accept the patient.  Yes   Patient/family informed of [facility]'s ownership interest in [facility] and [facility], as well as of the fact that they are under no obligation to receive care at these facilities.  PASRR submitted to EDS on 03/11/17     PASRR number received on 03/11/17     Existing PASRR number confirmed on       FL2 transmitted to all facilities in geographic area requested by pt/family on 03/11/17     FL2 transmitted to all facilities within larger geographic area on       Patient informed that his/her managed care company has contracts with or will negotiate with certain facilities, including the following:            Patient/family informed of bed offers received.  Patient chooses bed at       Physician recommends and patient chooses bed at      Patient to be transferred to   on  .  Patient to be transferred to facility by       Patient family notified on   of transfer.  Name of family member notified:        PHYSICIAN Please sign FL2     Additional Comment:    _______________________________________________ Margarito LinerSarah C Shakari Qazi,  LCSW 03/11/2017, 1:15 PM

## 2017-03-11 NOTE — Care Management Note (Signed)
Case Management Note  Patient Details  Name: Olivia Robertson Alma Lavoy MRN: [id number] Date of Birth: 12/02/1936  Subjective/Objective:    Pt admitted with a fall. She is from home alone.                 Action/Plan: PT/OT recommending SNF. CM following for d/c needs.   Expected Discharge Date:                  Expected Discharge Plan:  Skilled Nursing Facility  In-House Referral:  Clinical Social Work  Discharge planning Services     Post Acute Care Choice:    Choice offered to:     DME Arranged:    DME Agency:     HH Arranged:    HH Agency:     Status of Service:  In process, will continue to follow  If discussed at Long Length of Stay Meetings, dates discussed:    Additional Comments:  Kermit BaloKelli F Azzam Mehra, RN 03/11/2017, 2:26 PM

## 2017-03-11 NOTE — NC FL2 (Signed)
  [place] MEDICAID FL2 LEVEL OF CARE SCREENING TOOL     IDENTIFICATION  Patient Name: Olivia Robertson Birthdate: [birth year] Sex: female Admission Date (Current Location): 03/09/2017  [facility] [place]   Facility and Address:  [facility], [address], [place], [place] [address]      Provider Number: [id number]  Attending Physician Name and Address:  Latrelle DodrillBrittany J McIntyre, MD  Relative Name and Phone Number:       Current Level of Care: Hospital Recommended Level of Care: Skilled Nursing Facility Prior Approval Number:    Date Approved/Denied:   PASRR Number: [id number]  Discharge Plan: SNF    Current Diagnoses: Patient Active Problem List   Diagnosis Date Noted  . Closed fracture of left distal radius   . Fall 03/09/2017  . Closed fracture of nasal bones   . Delirium   . Laceration of forehead     Orientation RESPIRATION BLADDER Height & Weight     Self, Situation, Place  Normal Continent Weight: 120 lb (54.4 kg) Height:  5\' 6"  (167.6 cm)  BEHAVIORAL SYMPTOMS/MOOD NEUROLOGICAL BOWEL NUTRITION STATUS   (None)  (None) Continent Diet (Heart healthy/carb modified)  AMBULATORY STATUS COMMUNICATION OF NEEDS Skin   Limited Assist Verbally Bruising, Other (Comment) (Laceration to right side of head: Sutured.)                       Personal Care Assistance Level of Assistance  Bathing, Feeding, Dressing Bathing Assistance: Limited assistance Feeding assistance: Limited assistance Dressing Assistance: Maximum assistance     Functional Limitations Info  Sight, Hearing, Speech Sight Info: Adequate Hearing Info: Adequate Speech Info: Adequate    SPECIAL CARE FACTORS FREQUENCY  PT (By licensed PT), OT (By licensed OT)     PT Frequency: 5 x week OT Frequency: 5 x week            Contractures Contractures Info: Not present    Additional Factors Info  Code Status, Allergies Code Status Info:  Partial: No CPR, No Administer ACLS medications, No Defibrillation or Cardioversion Allergies Info: Penicillins           Current Medications (03/11/2017):  This is the current hospital active medication list Current Facility-Administered Medications  Medication Dose Route Frequency Provider Last Rate Last Dose  . acetaminophen (TYLENOL) tablet 650 mg  650 mg Oral Q6H PRN Garth BignessKathryn Timberlake, MD   650 mg at 03/11/17 [id number]   Or  . acetaminophen (TYLENOL) suppository 650 mg  650 mg Rectal Q6H PRN Garth BignessKathryn Timberlake, MD      . nitrofurantoin (macrocrystal-monohydrate) (MACROBID) capsule 100 mg  100 mg Oral Q12H Garth BignessKathryn Timberlake, MD   100 mg at 03/11/17 [id number]  . sodium chloride flush (NS) 0.9 % injection 3 mL  3 mL Intravenous Q12H Garth BignessKathryn Timberlake, MD   3 mL at 03/11/17 [id number]     Discharge Medications: Please see discharge summary for a list of discharge medications.  Relevant Imaging Results:  Relevant Lab Results:   Additional Information SS#: [id number]  Margarito LinerSarah C Orlinda Slomski, LCSW

## 2017-03-11 NOTE — Progress Notes (Signed)
  Echocardiogram 2D Echocardiogram has been performed.  Olivia SavoyCasey Robertson Olivia Robertson 03/11/2017, 11:00 AM

## 2017-03-11 NOTE — Progress Notes (Signed)
Family Medicine Teaching Service Daily Progress Note Intern Pager: [phone]  Patient name: Olivia Robertson Medical record number: [id number] Date of birth: 1936/04/29 Age: 81 y.o. Gender: female  Primary Care Provider: Mickie HillierIan McKeag, MD Consultants: None Code Status: Partial: no CPR but yes to intubation   Pt Overview and Major Events to Date:   Assessment and Plan: Olivia Robertson is a 81 y.o. female presenting with fall, bleeding R eyebrow lac, and AMS . PMH is significant for anxiety, unspecified neurological deficit for which patient refused further workup, hx of TIA (out of state, no records), and white coat hypertension.  Resolved AMS: Alert and Orientated x3. Family report increasing memory impairment over the last 6 months. CT head x 2 without acute abnormalities. No focal neurological deficits.  - fall precautions - PT/OT recommending SNF  Fall: Possibly a mechanical fall, tripping on rug and then slipping on blood.  Denies any passing out or dizziness prior to the event.  Possible etiologies; cardiac, vs. orthstatic hypotension vs. mechanical fall.  - CK 412 - orthostatics today  - echo ordered  UTI: Patient without tenderness on exam, but meeting McGeer's criteria due to leukocytosis and gross hematuria (urine red on collection while I was present). Will treat as UTI, this is another possible source of AMS in an elderly patient.  - continue Macrobid  Hx of Anxiety: patient self discontinued zoloft prior to admission. Will not restart given risk for additional falls. -no medications  L forehead lac: Sutured by surgery in ED. EMS reported arterial bleeding. -bacitracin - hgb baseline 14.3 per chart review, now 9.6   Normocytic Anemia. Likely due to acute blood loss from laceration. Baseline 14.3 - Will watch vitals closely  - CBC in the afternoon  - Follow up as needed   Distal R radius fracture: seen on XR wrist. Nondisplaced fracture vs osteoporosis.   -splint -outpatient ortho f/u  Nasal fracture: fracture of left and right nasal bones with mild displacement of the left nasal bone stable.  -pain control with tylenol  HTN: no home BP meds.  HLD: home med is lipitor.  -hold lipitor pending CK  Hx of PreDM: A1c 5.7 in 02/2015 - repeat A1c  Hx of TIA, neurological deficit: seen in 07/2016 by neurology for hx of TIA and hand tremor, impression was that cognitive and physical symptoms are related to PTSD and tremor appears psychogenic. Recommended neuropsych testing at that time. Last follow up with neurology was 08/2016, unable to conduct neuropsych testing due to persistent acute anxiety.  -continue to monitor mental status  FEN/GI: regular diet, Prophylaxis: SCDs   Disposition: home   Subjective:  Patient frustrated that her breakfast was not in the right place this morning. Asking for tylenol for her arm.   Objective: Temp:  [98 F (36.7 C)-98.4 F (36.9 C)] 98 F (36.7 C) (03/26 0433) Pulse Rate:  [64-75] 75 (03/26 0433) Resp:  [16-18] 18 (03/26 0433) BP: (122-172)/(51-70) 124/67 (03/26 0433) SpO2:  [98 %-100 %] 100 % (03/26 0433) Physical Exam: General: Lying in bed, NAD Skin:  ecchymosis bilateral medial eye sockets Cardiovascular: RRR, no murmurs noted  Respiratory: CTAB, no increased WOB Abdomen: BS+, no ttp  Extremities: No swelling in legs.  Laboratory:  Recent Labs Lab 03/10/17 0343 03/10/17 1647 03/11/17 0422  WBC 8.4 6.6 6.8  HGB 9.6* 9.9* 9.8*  HCT 29.2* 29.9* 29.9*  PLT 167 154 166    Recent Labs Lab 03/09/17 0333 03/10/17 0343 03/11/17 0422  NA 140  139 142  K 4.0 3.5 3.5  CL 105 109 109  CO2 23 23 25   BUN 34* 21* 14  CREATININE 0.76 0.63 0.62  CALCIUM 9.1 8.3* 8.7*  PROT 5.6*  --   --   BILITOT 0.8  --   --   ALKPHOS 72  --   --   ALT 23  --   --   AST 28  --   --   GLUCOSE 243* 112* 100*     Results for Olivia, Robertson (MRN [id number]) as of 03/10/2017 09:15  Ref.  Range 03/09/2017 09:44 03/09/2017 14:31 03/09/2017 19:56 03/10/2017 03:43  Troponin I Latest Ref Range: <0.03 ng/mL 0.09 (HH) 0.09 (HH) 0.07 (HH) 0.05 (HH)   Imaging/Diagnostic Tests: No results found.  Garth Bigness, MD 03/11/2017, 9:55 AM PGY-1, [facility] Family Medicine FPTS Intern pager: [phone], text pages welcome

## 2017-03-12 DIAGNOSIS — D638 Anemia in other chronic diseases classified elsewhere: Secondary | ICD-10-CM | POA: Diagnosis not present

## 2017-03-12 DIAGNOSIS — I959 Hypotension, unspecified: Secondary | ICD-10-CM | POA: Diagnosis not present

## 2017-03-12 DIAGNOSIS — Z681 Body mass index (BMI) 19 or less, adult: Secondary | ICD-10-CM | POA: Diagnosis not present

## 2017-03-12 DIAGNOSIS — F339 Major depressive disorder, recurrent, unspecified: Secondary | ICD-10-CM | POA: Diagnosis not present

## 2017-03-12 DIAGNOSIS — S52592A Other fractures of lower end of left radius, initial encounter for closed fracture: Secondary | ICD-10-CM | POA: Diagnosis not present

## 2017-03-12 DIAGNOSIS — R488 Other symbolic dysfunctions: Secondary | ICD-10-CM | POA: Diagnosis not present

## 2017-03-12 DIAGNOSIS — B961 Klebsiella pneumoniae [K. pneumoniae] as the cause of diseases classified elsewhere: Secondary | ICD-10-CM | POA: Diagnosis not present

## 2017-03-12 DIAGNOSIS — N390 Urinary tract infection, site not specified: Secondary | ICD-10-CM | POA: Diagnosis not present

## 2017-03-12 DIAGNOSIS — R7303 Prediabetes: Secondary | ICD-10-CM | POA: Diagnosis not present

## 2017-03-12 DIAGNOSIS — F419 Anxiety disorder, unspecified: Secondary | ICD-10-CM | POA: Diagnosis not present

## 2017-03-12 DIAGNOSIS — F0390 Unspecified dementia without behavioral disturbance: Secondary | ICD-10-CM | POA: Diagnosis not present

## 2017-03-12 DIAGNOSIS — E43 Unspecified severe protein-calorie malnutrition: Secondary | ICD-10-CM | POA: Diagnosis not present

## 2017-03-12 DIAGNOSIS — S52502S Unspecified fracture of the lower end of left radius, sequela: Secondary | ICD-10-CM | POA: Diagnosis not present

## 2017-03-12 DIAGNOSIS — R5381 Other malaise: Secondary | ICD-10-CM | POA: Diagnosis not present

## 2017-03-12 DIAGNOSIS — D62 Acute posthemorrhagic anemia: Secondary | ICD-10-CM | POA: Diagnosis not present

## 2017-03-12 DIAGNOSIS — E785 Hyperlipidemia, unspecified: Secondary | ICD-10-CM | POA: Diagnosis not present

## 2017-03-12 DIAGNOSIS — Z79899 Other long term (current) drug therapy: Secondary | ICD-10-CM | POA: Diagnosis not present

## 2017-03-12 DIAGNOSIS — S0992XA Unspecified injury of nose, initial encounter: Secondary | ICD-10-CM | POA: Diagnosis not present

## 2017-03-12 DIAGNOSIS — F4310 Post-traumatic stress disorder, unspecified: Secondary | ICD-10-CM | POA: Diagnosis not present

## 2017-03-12 DIAGNOSIS — S0181XA Laceration without foreign body of other part of head, initial encounter: Secondary | ICD-10-CM | POA: Diagnosis not present

## 2017-03-12 DIAGNOSIS — R410 Disorientation, unspecified: Secondary | ICD-10-CM | POA: Diagnosis not present

## 2017-03-12 DIAGNOSIS — M6281 Muscle weakness (generalized): Secondary | ICD-10-CM | POA: Diagnosis not present

## 2017-03-12 DIAGNOSIS — S01111S Laceration without foreign body of right eyelid and periocular area, sequela: Secondary | ICD-10-CM | POA: Diagnosis not present

## 2017-03-12 DIAGNOSIS — R2689 Other abnormalities of gait and mobility: Secondary | ICD-10-CM | POA: Diagnosis not present

## 2017-03-12 DIAGNOSIS — S0191XS Laceration without foreign body of unspecified part of head, sequela: Secondary | ICD-10-CM | POA: Diagnosis not present

## 2017-03-12 MED ORDER — NITROFURANTOIN MONOHYD MACRO 100 MG PO CAPS: 100.0000 mg | ORAL_CAPSULE | Freq: Two times a day (BID) | ORAL | 0 refills | Status: DC

## 2017-03-12 MED ORDER — ATORVASTATIN CALCIUM 40 MG PO TABS
40.0000 mg | ORAL_TABLET | Freq: Every day | ORAL | Status: DC
  Administered 2017-03-12: 40 mg via ORAL
  Filled 2017-03-12: qty 1

## 2017-03-12 MED ORDER — ACETAMINOPHEN 325 MG PO TABS: 650.0000 mg | ORAL_TABLET | Freq: Four times a day (QID) | ORAL | Status: DC | PRN

## 2017-03-12 NOTE — Progress Notes (Signed)
qPhysical Therapy Treatment Patient Details Name: Olivia Robertson MRN: [id number] DOB: [birth year] Today's Date: 03/12/2017    History of Present Illness Olivia Robertson is a 81 y.o. female presenting with fall, L distal radial fx, bleeding R eyebrow lac, and AMS . PMH is significant for anxiety, unspecified neurological deficit for which patient refused further workup, hx of TIA (out of state, no records), and white coat hypertension.    PT Comments    Patient progressing with ambulation and balance with use of platform RW.  Still very anxious and requiring mod A for ambulation due to unable to successful propel the walker unaided.  Will benefit from SNF level rehab at d/c.  Follow Up Recommendations  SNF     Equipment Recommendations       Recommendations for Other Services       Precautions / Restrictions Precautions Precautions: Fall Required Braces or Orthoses: Other Brace/Splint Other Brace/Splint: L wrist splint with thumb extension Restrictions LUE Weight Bearing: Weight bear through elbow only    Mobility  Bed Mobility Overal bed mobility: Needs Assistance Bed Mobility: Supine to Sit     Supine to sit: Min assist     General bed mobility comments: pt atempting to scoot to EOB using L UE so assist to scoot forward due to continued despite cues  Transfers Overall transfer level: Needs assistance Equipment used: Left platform walker Transfers: Sit to/from Stand Sit to Stand: Mod assist         General transfer comment: lifting assist, cues for not using L hand  Ambulation/Gait Ambulation/Gait assistance: Mod assist Ambulation Distance (Feet): 100 Feet Assistive device: Left platform walker Gait Pattern/deviations: Shuffle;Decreased stride length;Trunk flexed     General Gait Details: assist to guide walker, cues throughout for keeping R hand on walker due to fearful and at times confused, assist to turn walker   Stairs             Wheelchair Mobility    Modified Rankin (Stroke Patients Only)       Balance Overall balance assessment: Needs assistance Sitting-balance support: Feet supported;No upper extremity supported Sitting balance-Leahy Scale: Fair     Standing balance support: Bilateral upper extremity supported Standing balance-Leahy Scale: Poor Standing balance comment: fearul and requested PT assist with toilet hygiene                            Cognition Arousal/Alertness: Awake/alert Behavior During Therapy: Anxious Overall Cognitive Status: No family/caregiver present to determine baseline cognitive functioning                                        Exercises      General Comments        Pertinent Vitals/Pain Faces Pain Scale: Hurts little more Pain Location: head ("but I am used to it") Pain Descriptors / Indicators: Discomfort Pain Intervention(s): Monitored during session;Repositioned    Home Living                      Prior Function            PT Goals (current goals can now be found in the care plan section) Progress towards PT goals: Progressing toward goals    Frequency    Min 3X/week      PT Plan Current plan remains appropriate  Co-evaluation             End of Session Equipment Utilized During Treatment: Gait belt Activity Tolerance: Patient limited by fatigue Patient left: in bed;with call bell/phone within reach;with bed alarm set   PT Visit Diagnosis: Unsteadiness on feet (R26.81);Repeated falls (R29.6);Pain Pain - Right/Left: Left Pain - part of body: Arm     Time: 1610-9604 PT Time Calculation (min) (ACUTE ONLY): 24 min  Charges:  $Gait Training: 8-22 mins $Therapeutic Activity: 8-22 mins                    G CodesSheran Lawless, [place] [phone] 03/12/2017    Elray Mcgregor 03/12/2017, 5:05 PM

## 2017-03-12 NOTE — Progress Notes (Signed)
Occupational Therapy Treatment Patient Details Name: Olivia Robertson MRN: [id number] DOB: Jan 19, 1936 Today's Date: 03/12/2017    History of present illness Olivia Robertson is a 81 y.o. female presenting with fall, L distal radial fx, bleeding R eyebrow lac, and AMS . PMH is significant for anxiety, unspecified neurological deficit for which patient refused further workup, hx of TIA (out of state, no records), and white coat hypertension.   OT comments  Pt demonstrates progress toward OT goals. She was able to complete toilet transfer with mod assist this session and toileting hygiene with min assist. Pt remains fearful and anxious and demonstrates difficulty following commands and benefits from ste-by-step instruction. D/C plan remains appropriate. OT will continue to follow acutely with focus on improving ADL independence.   Follow Up Recommendations  SNF;Supervision/Assistance - 24 hour    Equipment Recommendations  Other (comment) (TBD at next venue)    Recommendations for Other Services Speech consult    Precautions / Restrictions Precautions Precautions: Fall Required Braces or Orthoses: Other Brace/Splint Other Brace/Splint: L wrist splint with thumb extension Restrictions LUE Weight Bearing: Weight bear through elbow only Other Position/Activity Restrictions: Will proceed with L wrist NWB unless otherwise ordered       Mobility Bed Mobility Overal bed mobility: Needs Assistance Bed Mobility: Supine to Sit     Supine to sit: Min assist     General bed mobility comments: pt atempting to scoot to EOB using L UE so assist to scoot forward due to continued despite cues  Transfers Overall transfer level: Needs assistance Equipment used: 1 person hand held assist Transfers: Stand Pivot Transfers Sit to Stand: Mod assist Stand pivot transfers: Mod assist       General transfer comment: Lifting assist and VC's to prevent weight bearing through L hand.     Balance Overall balance assessment: Needs assistance Sitting-balance support: Feet supported;No upper extremity supported Sitting balance-Leahy Scale: Fair     Standing balance support: Bilateral upper extremity supported Standing balance-Leahy Scale: Poor Standing balance comment: Fearful in standing.                           ADL either performed or assessed with clinical judgement   ADL Overall ADL's : Needs assistance/impaired                         Toilet Transfer: Moderate assistance;Stand-pivot;BSC Toilet Transfer Details (indicate cue type and reason): vc for pt to not weight bear through LUE Toileting- Clothing Manipulation and Hygiene: Minimal assistance;Sit to/from stand       Functional mobility during ADLs: Moderate assistance General ADL Comments: Pt becoming anxious requiring step-by-step VC's for basic tasks. Pt emotional when asked to sit on the EOB reporting "I don't know what you are wanting me to do."     Vision   Vision Assessment?: Vision impaired- to be further tested in functional context Additional Comments: Squinting during session. Limited ability to assess due to deficits in following commands.   Perception     Praxis      Cognition Arousal/Alertness: Awake/alert Behavior During Therapy: Anxious Overall Cognitive Status: No family/caregiver present to determine baseline cognitive functioning                                 General Comments: Word-finding difficulty along with difficulty following commands and problem-solving. Unsure if  this is due to communication deficit or cognitive in nature.        Exercises     Shoulder Instructions       General Comments      Pertinent Vitals/ Pain       Pain Assessment: Faces Faces Pain Scale: Hurts even more Pain Location: head Pain Descriptors / Indicators: Discomfort Pain Intervention(s): Monitored during session;Repositioned  Home Living  Family/patient expects to be discharged to:: Private residence                                        Prior Functioning/Environment              Frequency  Min 2X/week        Progress Toward Goals  OT Goals(current goals can now be found in the care plan section)  Progress towards OT goals: Progressing toward goals  Acute Rehab OT Goals Patient Stated Goal: be able to go home OT Goal Formulation: With patient Time For Goal Achievement: 03/24/17 Potential to Achieve Goals: Good ADL Goals Pt Will Perform Upper Body Bathing: with supervision;sitting Pt Will Perform Lower Body Bathing: with min guard assist;sitting/lateral leans;with adaptive equipment Pt Will Transfer to Toilet: with supervision;stand pivot transfer;bedside commode Pt Will Perform Toileting - Clothing Manipulation and hygiene: with modified independence;sit to/from stand Additional ADL Goal #1: Pt will perform bed mobility following weight bearing precautions through LUE  Plan Discharge plan remains appropriate    Co-evaluation                 End of Session Equipment Utilized During Treatment: Gait belt  OT Visit Diagnosis: Unsteadiness on feet (R26.81);Pain;Dizziness and giddiness (R42);Other symptoms and signs involving the nervous system (R29.898) Pain - Right/Left: Left Pain - part of body: Arm   Activity Tolerance Patient limited by lethargy   Patient Left in bed;with call bell/phone within reach;with bed alarm set   Nurse Communication Mobility status        Time: 8657-8469 OT Time Calculation (min): 19 min  Charges: OT General Charges $OT Visit: 1 Procedure OT Treatments $Self Care/Home Management : 8-22 mins  Doristine Section, MS OTR/L  Pager: [phone]    Olivia Robertson 03/12/2017, 5:29 PM

## 2017-03-12 NOTE — Progress Notes (Signed)
Family Medicine Teaching Service Daily Progress Note Intern Pager: [phone]  Patient name: Olivia Robertson Medical record number: [id number] Date of birth: Apr 22, 1936 Age: 81 y.o. Gender: female  Primary Care Provider: Mickie HillierIan McKeag, MD Consultants: None Code Status: Partial: no CPR but yes to intubation   Pt Overview and Major Events to Date:   Assessment and Plan: Olivia EndsMarion Alma Dilorenzo is a 81 y.o. female presenting with fall, bleeding R eyebrow lac, and AMS . PMH is significant for anxiety, unspecified neurological deficit for which patient refused further workup, hx of TIA (out of state, no records), and white coat hypertension.  Fall: Possibly a mechanical fall, tripping on rug and then slipping on blood.  Denies any passing out or dizziness prior to the event.  Possible etiologies; cardiac, vs. orthstatic hypotension vs. mechanical fall. Orthostatics positive by HR, given 1L NS bolus. Echo with normal systolic function, minimal concern for cardiac causes of fall.  -continue fall precautions, PT, OT, preparing for SNF placement  UTI: Patient without tenderness on exam, but meeting McGeer's criteria due to leukocytosis and gross hematuria (urine red on collection while I was present). Will treat as UTI, this is another possible source of AMS in an elderly patient.  - continue Macrobid for 7 days (day 4 today)  Hx of Anxiety: patient self discontinued zoloft prior to admission. Will not restart given risk for additional falls. -no medications  L forehead lac: Sutured by surgery in ED. EMS reported arterial bleeding. -bacitracin - hgb baseline 14.3 per chart review, now 9.6   Normocytic Anemia. Likely due to acute blood loss from laceration. Baseline 14.3 - Hgb stable on 3 CBCs since bleed  Distal left radius fracture: seen on XR wrist. Nondisplaced fracture vs osteoporosis.  -splint, attempted change to thumb spica splint, patient removed -outpatient ortho f/u  Nasal  fracture: fracture of left and right nasal bones with mild displacement of the left nasal bone stable.  -pain control with tylenol  HTN: no home BP meds.  HLD: home med is lipitor.  -restart lipitor  Hx of PreDM: A1c 5.7 in 02/2015 - repeat A1c  Hx of TIA, neurological deficit: seen in 07/2016 by neurology for hx of TIA and hand tremor, impression was that cognitive and physical symptoms are related to PTSD and tremor appears psychogenic. Recommended neuropsych testing at that time. Last follow up with neurology was 08/2016, unable to conduct neuropsych testing due to persistent acute anxiety.  -continue to monitor mental status  FEN/GI: regular diet, Prophylaxis: SCDs   Disposition: home   Subjective:  Patient sleeping on my arrival. Easily arousable, denies pain.   Objective: Temp:  [98.1 F (36.7 C)-99.1 F (37.3 C)] 98.1 F (36.7 C) (03/27 0432) Pulse Rate:  [70-81] 71 (03/27 0432) Resp:  [16-18] 16 (03/27 0432) BP: (125-147)/(63-89) 125/63 (03/27 0432) SpO2:  [97 %-100 %] 97 % (03/27 0432) Physical Exam: General: Lying in bed, NAD Skin:  ecchymosis bilateral medial eye sockets, improving Cardiovascular: RRR, no murmurs noted  Respiratory: CTAB, no increased WOB Abdomen: SNTND, +BS Extremities: No swelling in legs.  Laboratory:  Recent Labs Lab 03/10/17 0343 03/10/17 1647 03/11/17 0422  WBC 8.4 6.6 6.8  HGB 9.6* 9.9* 9.8*  HCT 29.2* 29.9* 29.9*  PLT 167 154 166    Recent Labs Lab 03/09/17 0333 03/10/17 0343 03/11/17 0422  NA 140 139 142  K 4.0 3.5 3.5  CL 105 109 109  CO2 23 23 25   BUN 34* 21* 14  CREATININE 0.76 0.63  0.62  CALCIUM 9.1 8.3* 8.7*  PROT 5.6*  --   --   BILITOT 0.8  --   --   ALKPHOS 72  --   --   ALT 23  --   --   AST 28  --   --   GLUCOSE 243* 112* 100*     Results for Olivia Robertson, Olivia Robertson (MRN [id number]) as of 03/10/2017 09:15  Ref. Range 03/09/2017 09:44 03/09/2017 14:31 03/09/2017 19:56 03/10/2017 03:43  Troponin I Latest  Ref Range: <0.03 ng/mL 0.09 (HH) 0.09 (HH) 0.07 (HH) 0.05 (HH)   Imaging/Diagnostic Tests: No results found.  Garth Bigness, MD 03/12/2017, 7:22 AM PGY-1, [facility] Medicine FPTS Intern pager: [phone], text pages welcome

## 2017-03-12 NOTE — Progress Notes (Signed)
Multiple attempts to call report; unable to get a nurse on the phone after the secretary answers. Will go ahead and send via PTAR once they arrive.

## 2017-03-12 NOTE — Clinical Social Work Placement (Signed)
   CLINICAL SOCIAL WORK PLACEMENT  NOTE 03/12/17 - DISCHARGED TO [facility] VIA AMBULANCE  Date:  03/12/2017  Patient Details  Name: Minerva EndsMarion Alma Vinciguerra MRN: [id number] Date of Birth: 09-03-1936  Clinical Social Work is seeking post-discharge placement for this patient at the Skilled  Nursing Facility level of care (*CSW will initial, date and re-position this form in  chart as items are completed):  Yes   Patient/family provided with [facility] [place] Clinical Social Work Department's list of facilities offering this level of care within the geographic area requested by the patient (or if unable, by the patient's family).  Yes   Patient/family informed of their freedom to choose among providers that offer the needed level of care, that participate in Medicare, Medicaid or managed care program needed by the patient, have an available bed and are willing to accept the patient.  Yes   Patient/family informed of [facility]'s ownership interest in [facility] and [facility], as well as of the fact that they are under no obligation to receive care at these facilities.  PASRR submitted to EDS on 03/11/17     PASRR number received on 03/11/17     Existing PASRR number confirmed on       FL2 transmitted to all facilities in geographic area requested by pt/family on 03/11/17     FL2 transmitted to all facilities within larger geographic area on       Patient informed that his/her managed care company has contracts with or will negotiate with certain facilities, including the following:         Yes - Patient/family informed of bed offers received.  [facility]     Physician recommends and patient chooses bed at      [facility].  Patient to be transferred to facility by  ambulance     Patient family notified on  03/12/18 of transfer.  Name of family member notified:   Sundra AlandBrian Finfrock, son - [phone].      PHYSICIAN Please sign FL2     Additional Comment: 03/12/17 - Received insurance authorization from [employer]#[id number]-#[id number], eff. 3/17. Clinicals due by 4/3. This information provided to [facility], admissions director with [facility].    _______________________________________________ Cristobal Goldmannrawford, Salahuddin Arismendez Bradley, LCSW 03/12/2017, 5:50 PM

## 2017-03-12 NOTE — Progress Notes (Signed)
Transitions of Care Pharmacy Note  Plan:  Educated on nitrofurantoin. Explained why patient was taking antibiotic, how to take it, length of therapy, and common side effects (discoloration of urine).   --------------------------------------------- Olivia EndsMarion Alma Robertson is an 81 y.o. female who presents with a chief complaint of altered mental status and a fall. In anticipation of discharge, pharmacy has reviewed this patient's prior to admission medication history, as well as [facility] inpatient [facility].  Current medication indications, dosing, frequency, and notable side effects reviewed with patient. patient verbalized understanding of current inpatient medication regimen and is aware that the After Visit Summary when presented, will represent the most accurate medication list at discharge.   Olivia PokeMarion Alma Robertson expressed concerns regarding her appearance. Spoke with patient about her bruising and assured her that is should fade.    Assessment: Understanding of regimen: fair Understanding of indications: fair Potential of compliance: excellent Barriers to Obtaining Medications: No  Patient instructed to contact inpatient pharmacy team with further questions or concerns if needed.    Time spent preparing for discharge counseling: 10 minutes Time spent counseling patient: 10 minutes    Thank you for allowing pharmacy to be a part of this patient's care.  Olivia RangeEmily A Robertson, PharmD PGY1 Pharmacy Resident Pager: [phone]

## 2017-03-13 ENCOUNTER — Non-Acute Institutional Stay (SKILLED_NURSING_FACILITY): Payer: Medicare HMO | Admitting: Internal Medicine

## 2017-03-13 ENCOUNTER — Encounter: Payer: Self-pay | Admitting: Internal Medicine

## 2017-03-13 ENCOUNTER — Encounter: Payer: Self-pay | Admitting: Family Medicine

## 2017-03-13 ENCOUNTER — Ambulatory Visit: Payer: Self-pay | Admitting: Family Medicine

## 2017-03-13 DIAGNOSIS — E785 Hyperlipidemia, unspecified: Secondary | ICD-10-CM

## 2017-03-13 DIAGNOSIS — B961 Klebsiella pneumoniae [K. pneumoniae] as the cause of diseases classified elsewhere: Secondary | ICD-10-CM | POA: Diagnosis not present

## 2017-03-13 DIAGNOSIS — S01111S Laceration without foreign body of right eyelid and periocular area, sequela: Secondary | ICD-10-CM

## 2017-03-13 DIAGNOSIS — S52502S Unspecified fracture of the lower end of left radius, sequela: Secondary | ICD-10-CM | POA: Diagnosis not present

## 2017-03-13 DIAGNOSIS — D638 Anemia in other chronic diseases classified elsewhere: Secondary | ICD-10-CM | POA: Diagnosis not present

## 2017-03-13 DIAGNOSIS — N390 Urinary tract infection, site not specified: Secondary | ICD-10-CM | POA: Diagnosis not present

## 2017-03-13 DIAGNOSIS — E43 Unspecified severe protein-calorie malnutrition: Secondary | ICD-10-CM | POA: Diagnosis not present

## 2017-03-13 DIAGNOSIS — R5381 Other malaise: Secondary | ICD-10-CM | POA: Diagnosis not present

## 2017-03-13 NOTE — Progress Notes (Signed)
LOCATION: [facility]  PCP: Mickie HillierIan McKeag, MD   Code Status: DNR  Goals of care: Advanced Directive information Advanced Directives 03/13/2017  Does Patient Have a Medical Advance Directive? Yes  Type of Advance Directive Out of facility DNR (pink MOST or yellow form)  Does patient want to make changes to medical advance directive? No - Patient declined  Would patient like information on creating a medical advance directive? -       Extended Emergency Contact Information Primary Emergency Contact: Marcell BarlowLe Clair,Sylvia  [place] Mobile Phone: [phone] Relation: Neighbor Secondary Emergency Contact: Contact,No Address: [address]          [place], [place] [address] [place] Home Phone: [phone] Relation: None   Allergies  Allergen Reactions  . Penicillins Hives  . Penicillins Hives    Chief Complaint  Patient presents with  . New Admit To SNF    New Admission Visit      HPI:  Patient is a 81 y.o. female seen today for short term rehabilitation post hospital admission from 03/09/17-03/12/17 post fall with right forehead laceration, nasal fracture and left non displaced distal radial fracture. She had change in her mental status and was diagnosed with Klebsiella urinary tract infection. She had repair of laceration and splint placed to left forearm. She was started on antibiotic for urinary tract infection. She is seen in her room today. She has medical history of anxiety, hypertension, hyperlipidemia, prediabetes among others.  Review of Systems: Constitutional: Negative for fever, chills, diaphoresis. Feels fair in terms of her energy. HENT: Negative for congestion. Positive for occasional headaches.   Eyes: Negative for double vision and discharge.  Respiratory: Negative for cough. Positive for shortness of breath with exertion.   Cardiovascular: Negative for chest pain Gastrointestinal: Negative for nausea, vomiting,  abdominal pain. Last bowel movement was this morning.  Musculoskeletal: Negative for fall in the facility.  Neurological: Negative for dizziness.    Past Medical History:  Diagnosis Date  . Anxiety   . Hyperlipidemia   . Stroke [facility](HCC)    Past Surgical History:  Procedure Laterality Date  . BREAST BIOPSY Bilateral   . HYSTEROTOMY     Social History:   reports that she does not drink alcohol or use drugs. Her tobacco history is not on file.  Family History  Problem Relation Age of Onset  . Adopted: Yes  . Alcohol abuse Mother     Medications: Allergies as of 03/13/2017      Reactions   Penicillins Hives   Penicillins Hives      Medication List       Accurate as of 03/13/17 12:45 PM. Always use your most recent med list.          acetaminophen 325 MG tablet Commonly known as:  TYLENOL Take 2 tablets (650 mg total) by mouth every 6 (six) hours as needed for mild pain (or Fever >/= 101).   aspirin EC 81 MG tablet Take 1 tablet (81 mg total) by mouth daily.   atorvastatin 40 MG tablet Commonly known as:  LIPITOR take 1 tablet by mouth once daily   nitrofurantoin (macrocrystal-monohydrate) 100 MG capsule Commonly known as:  MACROBID Take 1 capsule (100 mg total) by mouth every 12 (twelve) hours.       Immunizations: Immunization History  Administered Date(s) Administered  . Pneumococcal Conjugate-13 11/16/2016  . Tdap 03/01/2016     Physical Exam: Vitals:   03/13/17  1241  BP: 122/70  Pulse: 70  Resp: 16  Temp: 98.2 F (36.8 C)  TempSrc: Oral  SpO2: 98%  Weight: 120 lb (54.4 kg)  Height: 5\' 6"  (1.676 m)   Body mass index is 19.37 kg/m.  General- elderly female, frail and thin built, in no acute distress Head- normocephalic, laceration to right forehead with sutures and some matted blood Nose- no nasal discharge Throat- moist mucus membrane, has dentures Eyes- PERRLA, EOMI, no pallor, no icterus, no discharge Neck- no cervical  lymphadenopathy Cardiovascular- normal s1,s2, + systolic murmur Respiratory- bilateral clear to auscultation, no wheeze, no rhonchi, no crackles, no use of accessory muscles Abdomen- bowel sounds present, soft, non tender, no guarding or rigidity Musculoskeletal- able to move all 4 extremities, generalized weakness, thumb spica splint, able to move her fingers, trace leg edema Neurological- alert and oriented to person, place and time Skin- warm and dry, resolving ecchymoses around both eyes Psychiatry- poor insight   Labs reviewed: Basic Metabolic Panel:  Recent Labs  11/91/47 0333 03/09/17 0944 03/10/17 0343 03/11/17 0422  NA 140  --  139 142  K 4.0  --  3.5 3.5  CL 105  --  109 109  CO2 23  --  23 25  GLUCOSE 243*  --  112* 100*  BUN 34*  --  21* 14  CREATININE 0.76  --  0.63 0.62  CALCIUM 9.1  --  8.3* 8.7*  MG  --  1.8  --   --    Liver Function Tests:  Recent Labs  03/09/17 0333  AST 28  ALT 23  ALKPHOS 72  BILITOT 0.8  PROT 5.6*  ALBUMIN 3.0*   No results for input(s): LIPASE, AMYLASE in the last 8760 hours.  Recent Labs  03/09/17 0944  AMMONIA 20   CBC:  Recent Labs  03/10/17 0343 03/10/17 1647 03/11/17 0422  WBC 8.4 6.6 6.8  HGB 9.6* 9.9* 9.8*  HCT 29.2* 29.9* 29.9*  MCV 86.1 87.2 87.9  PLT 167 154 166   Cardiac Enzymes:  Recent Labs  03/09/17 0944 03/09/17 1431 03/09/17 1956 03/10/17 0343  CKTOTAL 412*  --   --   --   TROPONINI 0.09* 0.09* 0.07* 0.05*   BNP: Invalid input(s): POCBNP CBG:  Recent Labs  03/09/17 1625 03/10/17 0628 03/10/17 1213  GLUCAP 110* 128* 117*    Radiological Exams: Dg Chest 2 View  Result Date: 03/09/2017 CLINICAL DATA:  Pain following fall EXAM: CHEST  2 VIEW COMPARISON:  None. FINDINGS: There is no edema or consolidation. Heart is upper normal in size with pulmonary vascularity within normal limits. No adenopathy. There is calcification in the mitral annulus. There is aortic atherosclerosis. No  pneumothorax. Bones osteoporotic. No fracture evident. IMPRESSION: No edema or consolidation. Aortic atherosclerosis. No pneumothorax. Electronically Signed   By: Bretta Bang III M.D.   On: 03/09/2017 07:41   Dg Wrist Complete Left  Result Date: 03/09/2017 CLINICAL DATA:  Pain following fall EXAM: LEFT WRIST - COMPLETE 3+ VIEW COMPARISON:  None. FINDINGS: Frontal, oblique, lateral, and ulnar deviation scaphoid images were obtained. There is a subtle transverse lucency in the distal radial metaphysis, concerning for a subtle nondisplaced fracture in this area. No other evidence suggesting fracture. No dislocation. There is extensive osteoarthritic change in the scaphotrapezial and first carpal -metacarpal joints. No erosive change. Bones are osteoporotic. IMPRESSION: Subtle transverse lucency distal radial metaphysis, concerning for nondisplaced fracture in this area. Note that the adjacent pronator quadratus fat pad  is not elevated, however. It is possible that this linear lucency represents a prominent nutrient foramen in this patient with underlying osteoporosis. If patient is focally tender in the distal radial metaphysis region, it would be prudent to treat this area as a nondisplaced fracture. No other evidence of potential fracture. No dislocation. There is osteoarthritic change, most marked in the scaphotrapezial and first carpal -metacarpal regions. Electronically Signed   By: Bretta Bang III M.D.   On: 03/09/2017 07:40   Ct Head Wo Contrast  Result Date: 03/09/2017 CLINICAL DATA:  Pain following fall. Progression of altered mental status EXAM: CT HEAD WITHOUT CONTRAST TECHNIQUE: Contiguous axial images were obtained from the base of the skull through the vertex without intravenous contrast. COMPARISON:  Study obtained earlier in the day FINDINGS: Brain: Mild diffuse atrophy is stable. There is no intracranial mass, hemorrhage, extra-axial fluid collection, or midline shift. Mild small  vessel disease in the centra semiovale bilaterally is stable. No evident acute infarct. Vascular: No hyperdense vessel. There is calcification in each carotid siphon. Skull: The bony calvarium appears intact. Fractures of the left and right nasal bones with mild displacement of the left nasal bone fracture remain stable. There is a moderate frontal scalp hematoma on the right . Sinuses/Orbits: There is slight mucosal thickening in several ethmoid air cells bilaterally. Visualized paranasal sinuses elsewhere clear. No intraorbital lesions. Note that there is preseptal soft tissue swelling over the right orbit. Other: Mastoid air cells are clear. There is debris in each external auditory canal. IMPRESSION: No intracranial mass, hemorrhage, or extra-axial fluid collection. No acute infarct. There is mild atrophy with mild patchy periventricular small vessel disease, stable. Right frontal scalp hematoma. Preseptal soft tissue swelling over right orbit. Nasal fractures, stable. Probable cerumen in each external auditory canal. Areas of arterial vascular calcification noted. Mucosal thickening in several ethmoid air cells. Electronically Signed   By: Bretta Bang III M.D.   On: 03/09/2017 07:45   Ct Head Wo Contrast  Result Date: 03/09/2017 CLINICAL DATA:  81 year old female with fall and facial laceration. EXAM: CT HEAD WITHOUT CONTRAST CT MAXILLOFACIAL WITHOUT CONTRAST TECHNIQUE: Multidetector CT imaging of the head and maxillofacial structures were performed using the standard protocol without intravenous contrast. Multiplanar CT image reconstructions of the maxillofacial structures were also generated. COMPARISON:  None. FINDINGS: CT HEAD FINDINGS Brain: There is mild age-related atrophy and chronic microvascular ischemic changes. There is no acute intracranial hemorrhage. No mass effect or midline shift noted. No extra-axial fluid collection. Vascular: No hyperdense vessel or unexpected calcification. Skull:  Normal. Negative for fracture or focal lesion. Other: Right forehead hematoma. CT MAXILLOFACIAL FINDINGS Osseous: Minimally displaced fractures of the nasal bone with mild deviation of the nose to the left. No other acute facial bone fractures noted. There is no dislocation. The bones are osteopenic. Orbits: The globes and retro-orbital fat are preserved. Bilateral cataract surgeries noted. Sinuses: The visualized paranasal sinuses and mastoid air cells are clear. Cerumen noted in the external auditory canal bilaterally. Soft tissues: Mild soft tissue swelling of the nose as well as right forehead hematoma. IMPRESSION: 1. No acute intracranial hemorrhage. Mild age-related atrophy and chronic microvascular ischemic changes. 2. Minimally displaced fractures of the nasal bone with mild deviation of the nose to the left. Electronically Signed   By: Elgie Collard M.D.   On: 03/09/2017 04:44   Dg Finger Thumb Left  Result Date: 03/09/2017 CLINICAL DATA:  Recent fall and left thumb pain. Acute on chronic pain. EXAM: LEFT  THUMB 2+V COMPARISON:  Left wrist 03/09/2017 FINDINGS: Hyperextension of the thumb IP joint. Cast or splint on the wrist. No fracture involving the left thumb. Sclerosis and mild degenerative changes at the first carpometacarpal joint. IMPRESSION: No acute bone abnormality involving the left thumb. Hyperextension of the left thumb could be normal for this patient and recommend clinical correlation. Electronically Signed   By: Richarda Overlie M.D.   On: 03/09/2017 14:37   Ct Maxillofacial Wo Cm  Result Date: 03/09/2017 CLINICAL DATA:  81 year old female with fall and facial laceration. EXAM: CT HEAD WITHOUT CONTRAST CT MAXILLOFACIAL WITHOUT CONTRAST TECHNIQUE: Multidetector CT imaging of the head and maxillofacial structures were performed using the standard protocol without intravenous contrast. Multiplanar CT image reconstructions of the maxillofacial structures were also generated. COMPARISON:   None. FINDINGS: CT HEAD FINDINGS Brain: There is mild age-related atrophy and chronic microvascular ischemic changes. There is no acute intracranial hemorrhage. No mass effect or midline shift noted. No extra-axial fluid collection. Vascular: No hyperdense vessel or unexpected calcification. Skull: Normal. Negative for fracture or focal lesion. Other: Right forehead hematoma. CT MAXILLOFACIAL FINDINGS Osseous: Minimally displaced fractures of the nasal bone with mild deviation of the nose to the left. No other acute facial bone fractures noted. There is no dislocation. The bones are osteopenic. Orbits: The globes and retro-orbital fat are preserved. Bilateral cataract surgeries noted. Sinuses: The visualized paranasal sinuses and mastoid air cells are clear. Cerumen noted in the external auditory canal bilaterally. Soft tissues: Mild soft tissue swelling of the nose as well as right forehead hematoma. IMPRESSION: 1. No acute intracranial hemorrhage. Mild age-related atrophy and chronic microvascular ischemic changes. 2. Minimally displaced fractures of the nasal bone with mild deviation of the nose to the left. Electronically Signed   By: Elgie Collard M.D.   On: 03/09/2017 04:44    Assessment/Plan  Unsteady gait Patient remains a high fall risk.Will have patient work with PT/OT as tolerated to regain strength and restore function.  Fall precautions are in place.  Physical deconditioning From generalized weakness.Will have her work with physical therapy and occupational therapy team to help with gait training and muscle strengthening exercises.fall precautions. Skin care. Encourage to be out of bed.   Right head laceration Has suture in place. Will need suture removal on 03/18/2017. On negative for signs of infection.  Left distal nondisplaced radial fracture Status post thumb spica splint. Will need follow-up with orthopedic. Will work with physical therapy to help restore her strength and  mobility. continue Tylenol 650 mg every 6 hours as needed for pain.   Klebsiella UTI Continue and complete course of Macrobid on 03/14/2017. Hydration to be maintained. Perineal hygiene to be maintained.  Severe protein calorie malnutrition Consultants registered dietitian. Monitor oral intake and weekly week for now. 100 mg every 12 hours  Anemia of chronic disease Has some blood loss post fall with head laceration. Check CBC.  Hyperlipidemia Continue atorvastatin 40 mg daily    Goals of care: short term rehabilitation   Labs/tests ordered: cbc, bmp 03/18/17  Family/ staff Communication: reviewed care plan with patient and nursing supervisor  I have spent greater than 50 minutes for this encounter which includes reviewing hospital records, addressing above mentioned concerns, reviewing care plan with patient, answering patient's concerns and counseling her.     Oneal Grout, MD Internal Medicine [facility] [address] [place], [place] [address] Cell Phone (Monday-Friday 8 am - 5 pm): [phone] On Call: [phone]  and follow prompts after 5 pm and on weekends Office Phone: [phone] Office Fax: [phone]

## 2017-03-18 LAB — BASIC METABOLIC PANEL
BUN: 20 mg/dL (ref 4–21)
CREATININE: 0.6 mg/dL (ref 0.5–1.1)
Glucose: 88 mg/dL
POTASSIUM: 4 mmol/L (ref 3.4–5.3)
SODIUM: 145 mmol/L (ref 137–147)

## 2017-03-18 LAB — CBC AND DIFFERENTIAL
HCT: 30 % — AB (ref 36–46)
Hemoglobin: 9.6 g/dL — AB (ref 12.0–16.0)
PLATELETS: 254 10*3/uL (ref 150–399)
WBC: 5.6 10^3/mL

## 2017-03-28 ENCOUNTER — Encounter: Payer: Self-pay | Admitting: Adult Health

## 2017-03-28 ENCOUNTER — Non-Acute Institutional Stay (SKILLED_NURSING_FACILITY): Payer: Medicare HMO | Admitting: Adult Health

## 2017-03-28 DIAGNOSIS — B961 Klebsiella pneumoniae [K. pneumoniae] as the cause of diseases classified elsewhere: Secondary | ICD-10-CM | POA: Diagnosis not present

## 2017-03-28 DIAGNOSIS — B9689 Other specified bacterial agents as the cause of diseases classified elsewhere: Secondary | ICD-10-CM

## 2017-03-28 DIAGNOSIS — E43 Unspecified severe protein-calorie malnutrition: Secondary | ICD-10-CM | POA: Diagnosis not present

## 2017-03-28 DIAGNOSIS — S52502S Unspecified fracture of the lower end of left radius, sequela: Secondary | ICD-10-CM

## 2017-03-28 DIAGNOSIS — N390 Urinary tract infection, site not specified: Secondary | ICD-10-CM | POA: Diagnosis not present

## 2017-03-28 DIAGNOSIS — D638 Anemia in other chronic diseases classified elsewhere: Secondary | ICD-10-CM

## 2017-03-28 DIAGNOSIS — S0191XS Laceration without foreign body of unspecified part of head, sequela: Secondary | ICD-10-CM | POA: Diagnosis not present

## 2017-03-28 DIAGNOSIS — E785 Hyperlipidemia, unspecified: Secondary | ICD-10-CM | POA: Diagnosis not present

## 2017-03-28 DIAGNOSIS — R5381 Other malaise: Secondary | ICD-10-CM | POA: Diagnosis not present

## 2017-03-28 NOTE — Progress Notes (Signed)
DATE:  03/28/2017   MRN:  [id number]  BIRTHDAY: January 24, 1936  Facility:  Nursing Home Location:  [facility]  Nursing Home Room Number: 1006-B  LEVEL OF CARE:  SNF [phone])  Contact Information    Name Relation Home Work Mobile   Hurley Neighbor   [phone]   Contact,No  [phone]     Mathilda, Maguire [phone]  [phone]       Code Status History    Date Active Date Inactive Code Status Order ID Comments User Context   03/09/2017  1:25 PM 03/12/2017 10:37 PM Partial Code [id number]  Garth Bigness, MD ED   03/09/2017  8:52 AM 03/09/2017  1:25 PM Full Code [id number]  Garth Bigness, MD ED    Questions for Most Recent Historical Code Status (Order [id number])    Question Answer Comment   In the event of cardiac or respiratory ARREST: Initiate Code Blue, Call Rapid Response Yes    In the event of cardiac or respiratory ARREST: Perform CPR No    In the event of cardiac or respiratory ARREST: Perform Intubation/Mechanical Ventilation Yes    In the event of cardiac or respiratory ARREST: Use NIPPV/BiPAp only if indicated Yes    In the event of cardiac or respiratory ARREST: Administer ACLS medications if indicated No    In the event of cardiac or respiratory ARREST: Perform Defibrillation or Cardioversion if indicated No         Advance Directive Documentation     Most Recent Value  Type of Advance Directive  Out of facility DNR (pink MOST or yellow form)  Pre-existing out of facility DNR order (yellow form or pink MOST form)  -  "MOST" Form in Place?  -       Chief Complaint  Patient presents with  . Discharge Note    HISTORY OF  PRESENT ILLNESS:  This is a 81-YO female seen for discharge.  She will discharge 03/31/2017 with home health PT, OT, CNA, Nursing, and Social Work Services.    She has been admitted to [facility] on 03/12/17 from [facility] admission dates 03/09/17 to 03/12/17 post fall with  right forehead laceration, nasal fracture and left nondisplaced distal radial fracture. Laceration on forehead was repaired and splint placed to left forearm. She was, also, treated with antibiotic for Klebsiella UTI. She has PMH of anxiety, hypertension, hyperlipidemia and prediabetes.  Patient was admitted to this facility for short-term rehabilitation after the patient's recent hospitalization.  Patient has completed SNF rehabilitation and therapy has cleared the patient for discharge.   PAST MEDICAL HISTORY:  Past Medical History:  Diagnosis Date  . Anxiety   . Distal radius fracture, left   . History of posttraumatic stress disorder (PTSD)   . History of prediabetes   . HTN (hypertension)   . Hyperlipidemia   . Impaired memory    Likely dementia  . Laceration of skin of forehead   . Nasal fracture   . Stroke (HCC)   . UTI (urinary tract infection)      CURRENT MEDICATIONS: Reviewed  Patient's Medications  New Prescriptions   No medications on file  Previous Medications   ACETAMINOPHEN (TYLENOL) 325 MG TABLET    Take 2 tablets (650 mg total) by mouth every 6 (six) hours as needed for mild pain (or Fever >/= 101).   ASPIRIN EC 81 MG TABLET    Take 1 tablet (81 mg total) by mouth daily.  ATORVASTATIN (LIPITOR) 40 MG TABLET    take 1 tablet by mouth once daily   NUTRITIONAL SUPPLEMENT LIQD    Take 120 mLs by mouth daily. MedPass  Modified Medications   No medications on file  Discontinued Medications   NITROFURANTOIN, MACROCRYSTAL-MONOHYDRATE, (MACROBID) 100 MG CAPSULE    Take 1 capsule (100 mg total) by mouth every 12 (twelve) hours.     Allergies  Allergen Reactions  . Penicillins Hives  . Penicillins Hives     REVIEW OF SYSTEMS:  GENERAL: no change in appetite, no fatigue, no weight changes, no fever, chills or weakness EYES: Denies change in vision, dry eyes, eye pain, itching or discharge EARS: Denies change in hearing, ringing in ears, or earache NOSE:  Denies nasal congestion or epistaxis MOUTH and THROAT: Denies oral discomfort, gingival pain or bleeding, pain from teeth or hoarseness   RESPIRATORY: no cough, SOB, DOE, wheezing, hemoptysis CARDIAC: no chest pain, edema or palpitations GI: no abdominal pain, diarrhea, constipation, heart burn, nausea or vomiting GU: Denies dysuria, frequency, hematuria, incontinence, or discharge PSYCHIATRIC: Denies feeling of depression or anxiety. No report of hallucinations, insomnia, paranoia, or agitation    PHYSICAL EXAMINATION  GENERAL APPEARANCE: Well nourished. In no acute distress. Normal body habitus SKIN:  forehead laceration HEAD: Normal in size and contour. No evidence of trauma EYES: Lids open and close normally. No blepharitis, entropion or ectropion. PERRL. Conjunctivae are clear and sclerae are white. Lenses are without opacity EARS: Pinnae are normal. Patient hears normal voice tunes of the examiner MOUTH and THROAT: Lips are without lesions. Oral mucosa is moist and without lesions. Tongue is normal in shape, size, and color and without lesions NECK: supple, trachea midline, no neck masses, no thyroid tenderness, no thyromegaly LYMPHATICS: no LAN in the neck, no supraclavicular LAN RESPIRATORY: breathing is even & unlabored, BS CTAB CARDIAC: RRR, + murmur,no extra heart sounds, no edema GI: abdomen soft, normal BS, no masses, no tenderness, no hepatomegaly, no splenomegaly EXTREMITIES:  Able to move X 4 extremities PSYCHIATRIC: Alert and oriented X 3. Affect and behavior are appropriate  LABS/RADIOLOGY: Labs reviewed: Basic Metabolic Panel:  Recent Labs  03/47/42 0333 03/09/17 0944 03/10/17 0343 03/11/17 0422 03/18/17  NA 140  --  139 142 145  K 4.0  --  3.5 3.5 4.0  CL 105  --  109 109  --   CO2 23  --  23 25  --   GLUCOSE 243*  --  112* 100*  --   BUN 34*  --  21* 14 20  CREATININE 0.76  --  0.63 0.62 0.6  CALCIUM 9.1  --  8.3* 8.7*  --   MG  --  1.8  --   --   --      Liver Function Tests:  Recent Labs  03/09/17 0333  AST 28  ALT 23  ALKPHOS 72  BILITOT 0.8  PROT 5.6*  ALBUMIN 3.0*    Recent Labs  03/09/17 0944  AMMONIA 20   CBC:  Recent Labs  03/10/17 0343 03/10/17 1647 03/11/17 0422 03/18/17  WBC 8.4 6.6 6.8 5.6  HGB 9.6* 9.9* 9.8* 9.6*  HCT 29.2* 29.9* 29.9* 30*  MCV 86.1 87.2 87.9  --   PLT 167 154 166 254   Lipid Panel:  Recent Labs  06/01/16 1225  HDL 78   Cardiac Enzymes:  Recent Labs  03/09/17 0944 03/09/17 1431 03/09/17 1956 03/10/17 0343  CKTOTAL 412*  --   --   --  TROPONINI 0.09* 0.09* 0.07* 0.05*   CBG:  Recent Labs  03/09/17 1625 03/10/17 0628 03/10/17 1213  GLUCAP 110* 128* 117*      Dg Chest 2 View  Result Date: 03/09/2017 CLINICAL DATA:  Pain following fall EXAM: CHEST  2 VIEW COMPARISON:  None. FINDINGS: There is no edema or consolidation. Heart is upper normal in size with pulmonary vascularity within normal limits. No adenopathy. There is calcification in the mitral annulus. There is aortic atherosclerosis. No pneumothorax. Bones osteoporotic. No fracture evident. IMPRESSION: No edema or consolidation. Aortic atherosclerosis. No pneumothorax. Electronically Signed   By: Bretta Bang III M.D.   On: 03/09/2017 07:41   Dg Wrist Complete Left  Result Date: 03/09/2017 CLINICAL DATA:  Pain following fall EXAM: LEFT WRIST - COMPLETE 3+ VIEW COMPARISON:  None. FINDINGS: Frontal, oblique, lateral, and ulnar deviation scaphoid images were obtained. There is a subtle transverse lucency in the distal radial metaphysis, concerning for a subtle nondisplaced fracture in this area. No other evidence suggesting fracture. No dislocation. There is extensive osteoarthritic change in the scaphotrapezial and first carpal -metacarpal joints. No erosive change. Bones are osteoporotic. IMPRESSION: Subtle transverse lucency distal radial metaphysis, concerning for nondisplaced fracture in this area. Note that  the adjacent pronator quadratus fat pad is not elevated, however. It is possible that this linear lucency represents a prominent nutrient foramen in this patient with underlying osteoporosis. If patient is focally tender in the distal radial metaphysis region, it would be prudent to treat this area as a nondisplaced fracture. No other evidence of potential fracture. No dislocation. There is osteoarthritic change, most marked in the scaphotrapezial and first carpal -metacarpal regions. Electronically Signed   By: Bretta Bang III M.D.   On: 03/09/2017 07:40   Ct Head Wo Contrast  Result Date: 03/09/2017 CLINICAL DATA:  Pain following fall. Progression of altered mental status EXAM: CT HEAD WITHOUT CONTRAST TECHNIQUE: Contiguous axial images were obtained from the base of the skull through the vertex without intravenous contrast. COMPARISON:  Study obtained earlier in the day FINDINGS: Brain: Mild diffuse atrophy is stable. There is no intracranial mass, hemorrhage, extra-axial fluid collection, or midline shift. Mild small vessel disease in the centra semiovale bilaterally is stable. No evident acute infarct. Vascular: No hyperdense vessel. There is calcification in each carotid siphon. Skull: The bony calvarium appears intact. Fractures of the left and right nasal bones with mild displacement of the left nasal bone fracture remain stable. There is a moderate frontal scalp hematoma on the right . Sinuses/Orbits: There is slight mucosal thickening in several ethmoid air cells bilaterally. Visualized paranasal sinuses elsewhere clear. No intraorbital lesions. Note that there is preseptal soft tissue swelling over the right orbit. Other: Mastoid air cells are clear. There is debris in each external auditory canal. IMPRESSION: No intracranial mass, hemorrhage, or extra-axial fluid collection. No acute infarct. There is mild atrophy with mild patchy periventricular small vessel disease, stable. Right frontal  scalp hematoma. Preseptal soft tissue swelling over right orbit. Nasal fractures, stable. Probable cerumen in each external auditory canal. Areas of arterial vascular calcification noted. Mucosal thickening in several ethmoid air cells. Electronically Signed   By: Bretta Bang III M.D.   On: 03/09/2017 07:45   Ct Head Wo Contrast  Result Date: 03/09/2017 CLINICAL DATA:  81 year old female with fall and facial laceration. EXAM: CT HEAD WITHOUT CONTRAST CT MAXILLOFACIAL WITHOUT CONTRAST TECHNIQUE: Multidetector CT imaging of the head and maxillofacial structures were performed using the standard protocol without  intravenous contrast. Multiplanar CT image reconstructions of the maxillofacial structures were also generated. COMPARISON:  None. FINDINGS: CT HEAD FINDINGS Brain: There is mild age-related atrophy and chronic microvascular ischemic changes. There is no acute intracranial hemorrhage. No mass effect or midline shift noted. No extra-axial fluid collection. Vascular: No hyperdense vessel or unexpected calcification. Skull: Normal. Negative for fracture or focal lesion. Other: Right forehead hematoma. CT MAXILLOFACIAL FINDINGS Osseous: Minimally displaced fractures of the nasal bone with mild deviation of the nose to the left. No other acute facial bone fractures noted. There is no dislocation. The bones are osteopenic. Orbits: The globes and retro-orbital fat are preserved. Bilateral cataract surgeries noted. Sinuses: The visualized paranasal sinuses and mastoid air cells are clear. Cerumen noted in the external auditory canal bilaterally. Soft tissues: Mild soft tissue swelling of the nose as well as right forehead hematoma. IMPRESSION: 1. No acute intracranial hemorrhage. Mild age-related atrophy and chronic microvascular ischemic changes. 2. Minimally displaced fractures of the nasal bone with mild deviation of the nose to the left. Electronically Signed   By: Elgie Collard M.D.   On: 03/09/2017  04:44   Dg Finger Thumb Left  Result Date: 03/09/2017 CLINICAL DATA:  Recent fall and left thumb pain. Acute on chronic pain. EXAM: LEFT THUMB 2+V COMPARISON:  Left wrist 03/09/2017 FINDINGS: Hyperextension of the thumb IP joint. Cast or splint on the wrist. No fracture involving the left thumb. Sclerosis and mild degenerative changes at the first carpometacarpal joint. IMPRESSION: No acute bone abnormality involving the left thumb. Hyperextension of the left thumb could be normal for this patient and recommend clinical correlation. Electronically Signed   By: Richarda Overlie M.D.   On: 03/09/2017 14:37   Ct Maxillofacial Wo Cm  Result Date: 03/09/2017 CLINICAL DATA:  81 year old female with fall and facial laceration. EXAM: CT HEAD WITHOUT CONTRAST CT MAXILLOFACIAL WITHOUT CONTRAST TECHNIQUE: Multidetector CT imaging of the head and maxillofacial structures were performed using the standard protocol without intravenous contrast. Multiplanar CT image reconstructions of the maxillofacial structures were also generated. COMPARISON:  None. FINDINGS: CT HEAD FINDINGS Brain: There is mild age-related atrophy and chronic microvascular ischemic changes. There is no acute intracranial hemorrhage. No mass effect or midline shift noted. No extra-axial fluid collection. Vascular: No hyperdense vessel or unexpected calcification. Skull: Normal. Negative for fracture or focal lesion. Other: Right forehead hematoma. CT MAXILLOFACIAL FINDINGS Osseous: Minimally displaced fractures of the nasal bone with mild deviation of the nose to the left. No other acute facial bone fractures noted. There is no dislocation. The bones are osteopenic. Orbits: The globes and retro-orbital fat are preserved. Bilateral cataract surgeries noted. Sinuses: The visualized paranasal sinuses and mastoid air cells are clear. Cerumen noted in the external auditory canal bilaterally. Soft tissues: Mild soft tissue swelling of the nose as well as right  forehead hematoma. IMPRESSION: 1. No acute intracranial hemorrhage. Mild age-related atrophy and chronic microvascular ischemic changes. 2. Minimally displaced fractures of the nasal bone with mild deviation of the nose to the left. Electronically Signed   By: Elgie Collard M.D.   On: 03/09/2017 04:44    ASSESSMENT/PLAN:  Physical deconditioning  - For home health PT and OT, for therapeutic strengthening exercises; fall precautions  Right head laceration - remove sutures on forehead, keep skin clean and dry   Left distal nondisplaced radial fracture - has left wrist splint, follow-up with orthopedic, will have home health PT and OT for therapeutic strengthening exercises; continue Tylenol 650 mg by mouth  every 6 hours when necessary for pain  Hyperlipidemia - continue atorvastatin 40 mg 1 tab by mouth daily  Severe protein calorie malnutrition - continue med pass 120 mL by mouth daily  Anemia of chronic disease - stable Lab Results  Component Value Date   HGB 9.6 (A) 03/18/2017   Klebsiella UTI - S/P course of Macrobid; encourage perineal hygiene     I have filled out patient's discharge paperwork and written prescriptions.  Patient will receive home health PT, OT, SW, Nursing and CNA.  DME provided:  None  Total discharge time: Less than 30 minutes  Discharge time involved coordination of the discharge process with social worker, nursing staff and therapy department. Medical justification for home health services verified.    Monina C. Medina-Vargas - NP    [employer] [phone]

## 2017-04-01 ENCOUNTER — Ambulatory Visit (INDEPENDENT_AMBULATORY_CARE_PROVIDER_SITE_OTHER): Payer: Medicare HMO | Admitting: Orthopaedic Surgery

## 2017-04-04 DIAGNOSIS — S52502D Unspecified fracture of the lower end of left radius, subsequent encounter for closed fracture with routine healing: Secondary | ICD-10-CM | POA: Diagnosis not present

## 2017-04-04 DIAGNOSIS — S022XXD Fracture of nasal bones, subsequent encounter for fracture with routine healing: Secondary | ICD-10-CM | POA: Diagnosis not present

## 2017-04-04 DIAGNOSIS — Z7982 Long term (current) use of aspirin: Secondary | ICD-10-CM | POA: Diagnosis not present

## 2017-04-04 DIAGNOSIS — N390 Urinary tract infection, site not specified: Secondary | ICD-10-CM | POA: Diagnosis not present

## 2017-04-04 DIAGNOSIS — F0390 Unspecified dementia without behavioral disturbance: Secondary | ICD-10-CM | POA: Diagnosis not present

## 2017-04-04 DIAGNOSIS — B961 Klebsiella pneumoniae [K. pneumoniae] as the cause of diseases classified elsewhere: Secondary | ICD-10-CM | POA: Diagnosis not present

## 2017-04-04 DIAGNOSIS — Z792 Long term (current) use of antibiotics: Secondary | ICD-10-CM | POA: Diagnosis not present

## 2017-04-04 DIAGNOSIS — F419 Anxiety disorder, unspecified: Secondary | ICD-10-CM | POA: Diagnosis not present

## 2017-04-04 DIAGNOSIS — D649 Anemia, unspecified: Secondary | ICD-10-CM | POA: Diagnosis not present

## 2017-04-05 ENCOUNTER — Telehealth: Payer: Self-pay | Admitting: *Deleted

## 2017-04-05 NOTE — Telephone Encounter (Signed)
Elease Hashimoto, RN with [facility] called to request verbal orders for skilled nursing once a week for 8 weeks with 2 PRN visits.  She also is requesting physical therapy, occupational and speak therapy evaluations/treatment.  Please give her a call at [phone]; ok to leave a voice message.  Clovis Pu, RN

## 2017-04-07 NOTE — Progress Notes (Signed)
Physical Therapy Note  (Late entry for G Code correction)   2017-04-04 1309  PT G-Codes **NOT FOR INPATIENT CLASS**  Functional Assessment Tool Used Clinical judgement  Functional Limitation Mobility: Walking and moving around  Mobility: Walking and Moving Around Current Status (A5409) CK  Mobility: Walking and Moving Around Goal Status (W1191) CI    Van Clines, PT  Acute Rehabilitation Services Pager [phone] Office [phone]

## 2017-04-08 DIAGNOSIS — N390 Urinary tract infection, site not specified: Secondary | ICD-10-CM | POA: Diagnosis not present

## 2017-04-08 DIAGNOSIS — Z792 Long term (current) use of antibiotics: Secondary | ICD-10-CM | POA: Diagnosis not present

## 2017-04-08 DIAGNOSIS — S52502D Unspecified fracture of the lower end of left radius, subsequent encounter for closed fracture with routine healing: Secondary | ICD-10-CM | POA: Diagnosis not present

## 2017-04-08 DIAGNOSIS — B961 Klebsiella pneumoniae [K. pneumoniae] as the cause of diseases classified elsewhere: Secondary | ICD-10-CM | POA: Diagnosis not present

## 2017-04-08 DIAGNOSIS — S022XXD Fracture of nasal bones, subsequent encounter for fracture with routine healing: Secondary | ICD-10-CM | POA: Diagnosis not present

## 2017-04-08 DIAGNOSIS — D649 Anemia, unspecified: Secondary | ICD-10-CM | POA: Diagnosis not present

## 2017-04-08 DIAGNOSIS — Z7982 Long term (current) use of aspirin: Secondary | ICD-10-CM | POA: Diagnosis not present

## 2017-04-08 DIAGNOSIS — F419 Anxiety disorder, unspecified: Secondary | ICD-10-CM | POA: Diagnosis not present

## 2017-04-08 DIAGNOSIS — F0390 Unspecified dementia without behavioral disturbance: Secondary | ICD-10-CM | POA: Diagnosis not present

## 2017-04-08 NOTE — Telephone Encounter (Signed)
Elease Hashimoto called again for verbal orders.  Jazmin Hartsell,CMA,

## 2017-04-08 NOTE — Telephone Encounter (Signed)
Attempted to call Olivia Robertson back. No answer. If possible please inform her of may AGREEMENT with the listed orders from your previous message, as I am leaving town for the rest of the week tomorrow morning.  Thank you!

## 2017-04-11 DIAGNOSIS — D649 Anemia, unspecified: Secondary | ICD-10-CM | POA: Diagnosis not present

## 2017-04-11 DIAGNOSIS — S022XXD Fracture of nasal bones, subsequent encounter for fracture with routine healing: Secondary | ICD-10-CM | POA: Diagnosis not present

## 2017-04-11 DIAGNOSIS — F419 Anxiety disorder, unspecified: Secondary | ICD-10-CM | POA: Diagnosis not present

## 2017-04-11 DIAGNOSIS — F0390 Unspecified dementia without behavioral disturbance: Secondary | ICD-10-CM | POA: Diagnosis not present

## 2017-04-11 DIAGNOSIS — Z792 Long term (current) use of antibiotics: Secondary | ICD-10-CM | POA: Diagnosis not present

## 2017-04-11 DIAGNOSIS — S52502D Unspecified fracture of the lower end of left radius, subsequent encounter for closed fracture with routine healing: Secondary | ICD-10-CM | POA: Diagnosis not present

## 2017-04-11 DIAGNOSIS — Z7982 Long term (current) use of aspirin: Secondary | ICD-10-CM | POA: Diagnosis not present

## 2017-04-11 DIAGNOSIS — N390 Urinary tract infection, site not specified: Secondary | ICD-10-CM | POA: Diagnosis not present

## 2017-04-11 DIAGNOSIS — B961 Klebsiella pneumoniae [K. pneumoniae] as the cause of diseases classified elsewhere: Secondary | ICD-10-CM | POA: Diagnosis not present

## 2017-04-11 NOTE — Telephone Encounter (Signed)
Left message on voice mail giving verbal orders.  

## 2017-04-16 DIAGNOSIS — N390 Urinary tract infection, site not specified: Secondary | ICD-10-CM | POA: Diagnosis not present

## 2017-04-16 DIAGNOSIS — F419 Anxiety disorder, unspecified: Secondary | ICD-10-CM | POA: Diagnosis not present

## 2017-04-16 DIAGNOSIS — S52502D Unspecified fracture of the lower end of left radius, subsequent encounter for closed fracture with routine healing: Secondary | ICD-10-CM | POA: Diagnosis not present

## 2017-04-16 DIAGNOSIS — D649 Anemia, unspecified: Secondary | ICD-10-CM | POA: Diagnosis not present

## 2017-04-16 DIAGNOSIS — Z7982 Long term (current) use of aspirin: Secondary | ICD-10-CM | POA: Diagnosis not present

## 2017-04-16 DIAGNOSIS — B961 Klebsiella pneumoniae [K. pneumoniae] as the cause of diseases classified elsewhere: Secondary | ICD-10-CM | POA: Diagnosis not present

## 2017-04-16 DIAGNOSIS — Z792 Long term (current) use of antibiotics: Secondary | ICD-10-CM | POA: Diagnosis not present

## 2017-04-16 DIAGNOSIS — F0390 Unspecified dementia without behavioral disturbance: Secondary | ICD-10-CM | POA: Diagnosis not present

## 2017-04-16 DIAGNOSIS — S022XXD Fracture of nasal bones, subsequent encounter for fracture with routine healing: Secondary | ICD-10-CM | POA: Diagnosis not present

## 2017-04-18 DIAGNOSIS — N390 Urinary tract infection, site not specified: Secondary | ICD-10-CM | POA: Diagnosis not present

## 2017-04-18 DIAGNOSIS — F419 Anxiety disorder, unspecified: Secondary | ICD-10-CM | POA: Diagnosis not present

## 2017-04-18 DIAGNOSIS — Z792 Long term (current) use of antibiotics: Secondary | ICD-10-CM | POA: Diagnosis not present

## 2017-04-18 DIAGNOSIS — S022XXD Fracture of nasal bones, subsequent encounter for fracture with routine healing: Secondary | ICD-10-CM | POA: Diagnosis not present

## 2017-04-18 DIAGNOSIS — F0390 Unspecified dementia without behavioral disturbance: Secondary | ICD-10-CM | POA: Diagnosis not present

## 2017-04-18 DIAGNOSIS — D649 Anemia, unspecified: Secondary | ICD-10-CM | POA: Diagnosis not present

## 2017-04-18 DIAGNOSIS — S52502D Unspecified fracture of the lower end of left radius, subsequent encounter for closed fracture with routine healing: Secondary | ICD-10-CM | POA: Diagnosis not present

## 2017-04-18 DIAGNOSIS — Z7982 Long term (current) use of aspirin: Secondary | ICD-10-CM | POA: Diagnosis not present

## 2017-04-18 DIAGNOSIS — B961 Klebsiella pneumoniae [K. pneumoniae] as the cause of diseases classified elsewhere: Secondary | ICD-10-CM | POA: Diagnosis not present

## 2017-04-22 DIAGNOSIS — Z7982 Long term (current) use of aspirin: Secondary | ICD-10-CM | POA: Diagnosis not present

## 2017-04-22 DIAGNOSIS — N390 Urinary tract infection, site not specified: Secondary | ICD-10-CM | POA: Diagnosis not present

## 2017-04-22 DIAGNOSIS — B961 Klebsiella pneumoniae [K. pneumoniae] as the cause of diseases classified elsewhere: Secondary | ICD-10-CM | POA: Diagnosis not present

## 2017-04-22 DIAGNOSIS — S52502D Unspecified fracture of the lower end of left radius, subsequent encounter for closed fracture with routine healing: Secondary | ICD-10-CM | POA: Diagnosis not present

## 2017-04-22 DIAGNOSIS — F419 Anxiety disorder, unspecified: Secondary | ICD-10-CM | POA: Diagnosis not present

## 2017-04-22 DIAGNOSIS — Z792 Long term (current) use of antibiotics: Secondary | ICD-10-CM | POA: Diagnosis not present

## 2017-04-22 DIAGNOSIS — F0390 Unspecified dementia without behavioral disturbance: Secondary | ICD-10-CM | POA: Diagnosis not present

## 2017-04-22 DIAGNOSIS — D649 Anemia, unspecified: Secondary | ICD-10-CM | POA: Diagnosis not present

## 2017-04-22 DIAGNOSIS — S022XXD Fracture of nasal bones, subsequent encounter for fracture with routine healing: Secondary | ICD-10-CM | POA: Diagnosis not present

## 2017-04-23 ENCOUNTER — Telehealth: Payer: Self-pay | Admitting: *Deleted

## 2017-04-23 DIAGNOSIS — Z792 Long term (current) use of antibiotics: Secondary | ICD-10-CM | POA: Diagnosis not present

## 2017-04-23 DIAGNOSIS — D649 Anemia, unspecified: Secondary | ICD-10-CM | POA: Diagnosis not present

## 2017-04-23 DIAGNOSIS — F0390 Unspecified dementia without behavioral disturbance: Secondary | ICD-10-CM | POA: Diagnosis not present

## 2017-04-23 DIAGNOSIS — S022XXD Fracture of nasal bones, subsequent encounter for fracture with routine healing: Secondary | ICD-10-CM | POA: Diagnosis not present

## 2017-04-23 DIAGNOSIS — N390 Urinary tract infection, site not specified: Secondary | ICD-10-CM | POA: Diagnosis not present

## 2017-04-23 DIAGNOSIS — S52502D Unspecified fracture of the lower end of left radius, subsequent encounter for closed fracture with routine healing: Secondary | ICD-10-CM | POA: Diagnosis not present

## 2017-04-23 DIAGNOSIS — Z7982 Long term (current) use of aspirin: Secondary | ICD-10-CM | POA: Diagnosis not present

## 2017-04-23 DIAGNOSIS — F419 Anxiety disorder, unspecified: Secondary | ICD-10-CM | POA: Diagnosis not present

## 2017-04-23 DIAGNOSIS — B961 Klebsiella pneumoniae [K. pneumoniae] as the cause of diseases classified elsewhere: Secondary | ICD-10-CM | POA: Diagnosis not present

## 2017-04-23 NOTE — Telephone Encounter (Signed)
Enrique SackKendra, Physical Therapist with [facility] called needing to speak with provider regarding patient's fracture.  Patient is wearing a splint and stating it is very uncomfortable.  Patient reported to Enrique SackKendra that she was told she could use her hand.  Please give her a call at [phone].  She need to if pt has follow up visit to check status of fracture, if pt could use left hand and remove the splint.  Clovis PuMartin, Damon Hargrove L, RN

## 2017-04-24 DIAGNOSIS — Z7982 Long term (current) use of aspirin: Secondary | ICD-10-CM | POA: Diagnosis not present

## 2017-04-24 DIAGNOSIS — S022XXD Fracture of nasal bones, subsequent encounter for fracture with routine healing: Secondary | ICD-10-CM | POA: Diagnosis not present

## 2017-04-24 DIAGNOSIS — S52502D Unspecified fracture of the lower end of left radius, subsequent encounter for closed fracture with routine healing: Secondary | ICD-10-CM | POA: Diagnosis not present

## 2017-04-24 DIAGNOSIS — F419 Anxiety disorder, unspecified: Secondary | ICD-10-CM | POA: Diagnosis not present

## 2017-04-24 DIAGNOSIS — B961 Klebsiella pneumoniae [K. pneumoniae] as the cause of diseases classified elsewhere: Secondary | ICD-10-CM | POA: Diagnosis not present

## 2017-04-24 DIAGNOSIS — Z792 Long term (current) use of antibiotics: Secondary | ICD-10-CM | POA: Diagnosis not present

## 2017-04-24 DIAGNOSIS — N390 Urinary tract infection, site not specified: Secondary | ICD-10-CM | POA: Diagnosis not present

## 2017-04-24 DIAGNOSIS — D649 Anemia, unspecified: Secondary | ICD-10-CM | POA: Diagnosis not present

## 2017-04-24 DIAGNOSIS — F0390 Unspecified dementia without behavioral disturbance: Secondary | ICD-10-CM | POA: Diagnosis not present
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
## 2017-04-25 DIAGNOSIS — S52502D Unspecified fracture of the lower end of left radius, subsequent encounter for closed fracture with routine healing: Secondary | ICD-10-CM | POA: Diagnosis not present

## 2017-04-25 DIAGNOSIS — S022XXD Fracture of nasal bones, subsequent encounter for fracture with routine healing: Secondary | ICD-10-CM | POA: Diagnosis not present

## 2017-04-25 DIAGNOSIS — Z7982 Long term (current) use of aspirin: Secondary | ICD-10-CM | POA: Diagnosis not present

## 2017-04-25 DIAGNOSIS — D649 Anemia, unspecified: Secondary | ICD-10-CM | POA: Diagnosis not present

## 2017-04-25 DIAGNOSIS — N390 Urinary tract infection, site not specified: Secondary | ICD-10-CM | POA: Diagnosis not present

## 2017-04-25 DIAGNOSIS — Z792 Long term (current) use of antibiotics: Secondary | ICD-10-CM | POA: Diagnosis not present

## 2017-04-25 DIAGNOSIS — F0390 Unspecified dementia without behavioral disturbance: Secondary | ICD-10-CM | POA: Diagnosis not present

## 2017-04-25 DIAGNOSIS — B961 Klebsiella pneumoniae [K. pneumoniae] as the cause of diseases classified elsewhere: Secondary | ICD-10-CM | POA: Diagnosis not present

## 2017-04-25 DIAGNOSIS — F419 Anxiety disorder, unspecified: Secondary | ICD-10-CM | POA: Diagnosis not present

## 2017-04-29 DIAGNOSIS — F0390 Unspecified dementia without behavioral disturbance: Secondary | ICD-10-CM | POA: Diagnosis not present

## 2017-04-29 DIAGNOSIS — N390 Urinary tract infection, site not specified: Secondary | ICD-10-CM | POA: Diagnosis not present

## 2017-04-29 DIAGNOSIS — Z7982 Long term (current) use of aspirin: Secondary | ICD-10-CM | POA: Diagnosis not present

## 2017-04-29 DIAGNOSIS — B961 Klebsiella pneumoniae [K. pneumoniae] as the cause of diseases classified elsewhere: Secondary | ICD-10-CM | POA: Diagnosis not present

## 2017-04-29 DIAGNOSIS — S022XXD Fracture of nasal bones, subsequent encounter for fracture with routine healing: Secondary | ICD-10-CM | POA: Diagnosis not present

## 2017-04-29 DIAGNOSIS — S52502D Unspecified fracture of the lower end of left radius, subsequent encounter for closed fracture with routine healing: Secondary | ICD-10-CM | POA: Diagnosis not present

## 2017-04-29 DIAGNOSIS — F419 Anxiety disorder, unspecified: Secondary | ICD-10-CM | POA: Diagnosis not present

## 2017-04-29 DIAGNOSIS — Z792 Long term (current) use of antibiotics: Secondary | ICD-10-CM | POA: Diagnosis not present

## 2017-04-29 DIAGNOSIS — D649 Anemia, unspecified: Secondary | ICD-10-CM | POA: Diagnosis not present

## 2017-04-30 DIAGNOSIS — B961 Klebsiella pneumoniae [K. pneumoniae] as the cause of diseases classified elsewhere: Secondary | ICD-10-CM | POA: Diagnosis not present

## 2017-04-30 DIAGNOSIS — Z792 Long term (current) use of antibiotics: Secondary | ICD-10-CM | POA: Diagnosis not present

## 2017-04-30 DIAGNOSIS — S52502D Unspecified fracture of the lower end of left radius, subsequent encounter for closed fracture with routine healing: Secondary | ICD-10-CM | POA: Diagnosis not present

## 2017-04-30 DIAGNOSIS — S022XXD Fracture of nasal bones, subsequent encounter for fracture with routine healing: Secondary | ICD-10-CM | POA: Diagnosis not present

## 2017-04-30 DIAGNOSIS — N390 Urinary tract infection, site not specified: Secondary | ICD-10-CM | POA: Diagnosis not present

## 2017-04-30 DIAGNOSIS — F0390 Unspecified dementia without behavioral disturbance: Secondary | ICD-10-CM | POA: Diagnosis not present

## 2017-04-30 DIAGNOSIS — Z7982 Long term (current) use of aspirin: Secondary | ICD-10-CM | POA: Diagnosis not present

## 2017-04-30 DIAGNOSIS — D649 Anemia, unspecified: Secondary | ICD-10-CM | POA: Diagnosis not present

## 2017-04-30 DIAGNOSIS — F419 Anxiety disorder, unspecified: Secondary | ICD-10-CM | POA: Diagnosis not present

## 2017-05-01 ENCOUNTER — Telehealth: Payer: Self-pay | Admitting: Family Medicine

## 2017-05-01 DIAGNOSIS — S022XXD Fracture of nasal bones, subsequent encounter for fracture with routine healing: Secondary | ICD-10-CM | POA: Diagnosis not present

## 2017-05-01 DIAGNOSIS — B961 Klebsiella pneumoniae [K. pneumoniae] as the cause of diseases classified elsewhere: Secondary | ICD-10-CM | POA: Diagnosis not present

## 2017-05-01 DIAGNOSIS — S52502D Unspecified fracture of the lower end of left radius, subsequent encounter for closed fracture with routine healing: Secondary | ICD-10-CM | POA: Diagnosis not present

## 2017-05-01 DIAGNOSIS — F0390 Unspecified dementia without behavioral disturbance: Secondary | ICD-10-CM | POA: Diagnosis not present

## 2017-05-01 DIAGNOSIS — D649 Anemia, unspecified: Secondary | ICD-10-CM | POA: Diagnosis not present

## 2017-05-01 DIAGNOSIS — Z792 Long term (current) use of antibiotics: Secondary | ICD-10-CM | POA: Diagnosis not present

## 2017-05-01 DIAGNOSIS — F419 Anxiety disorder, unspecified: Secondary | ICD-10-CM | POA: Diagnosis not present

## 2017-05-01 DIAGNOSIS — Z7982 Long term (current) use of aspirin: Secondary | ICD-10-CM | POA: Diagnosis not present

## 2017-05-01 DIAGNOSIS — N390 Urinary tract infection, site not specified: Secondary | ICD-10-CM | POA: Diagnosis not present

## 2017-05-01 NOTE — Telephone Encounter (Signed)
Dr. Wende MottMcKeag would like someone on red team to call pt and recommend that she come in tomorrow or Friday to be seen. Thanks! ep

## 2017-05-01 NOTE — Telephone Encounter (Signed)
I contacted Verlon AuLeslie and was able to discuss patient's case with her. I agree that patient's injuries are concerning. After discussion with her it seemed as though a significant amount of effort had been put forward to encourage patient to be evaluated in the ED. Despite that effort patient still refused. I informed Verlon AuLeslie that patient does indeed have a appointment with me this coming Monday, patient has a good record of keeping her appointments. I will assess her at this appointment.  Of note: This is now the second major set of reported injuries patient has suffered via a fall. My plan for Monday's visit is to obtain details of these falls, and trauma. Using that information I will be able to decide if further evaluation for fall risk is warranted. Also, it appears as though patient has not been on a SSRI since discharge from the hospital in late March (likely earlier than that). This likely has a strong contributing factor to her sometimes overwhelming amount of anxiety. I plan on discussing this as well during patient's visit on Monday.  Kathee DeltonIan D McKeag, MD,MS,  PGY3 05/01/2017 4:49 PM
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
## 2017-05-02 ENCOUNTER — Telehealth: Payer: Self-pay | Admitting: Family Medicine

## 2017-05-02 DIAGNOSIS — F0390 Unspecified dementia without behavioral disturbance: Secondary | ICD-10-CM | POA: Diagnosis not present

## 2017-05-02 DIAGNOSIS — B961 Klebsiella pneumoniae [K. pneumoniae] as the cause of diseases classified elsewhere: Secondary | ICD-10-CM | POA: Diagnosis not present

## 2017-05-02 DIAGNOSIS — F419 Anxiety disorder, unspecified: Secondary | ICD-10-CM | POA: Diagnosis not present

## 2017-05-02 DIAGNOSIS — N390 Urinary tract infection, site not specified: Secondary | ICD-10-CM | POA: Diagnosis not present

## 2017-05-02 DIAGNOSIS — Z792 Long term (current) use of antibiotics: Secondary | ICD-10-CM | POA: Diagnosis not present

## 2017-05-02 DIAGNOSIS — D649 Anemia, unspecified: Secondary | ICD-10-CM | POA: Diagnosis not present

## 2017-05-02 DIAGNOSIS — Z7982 Long term (current) use of aspirin: Secondary | ICD-10-CM | POA: Diagnosis not present

## 2017-05-02 DIAGNOSIS — S022XXD Fracture of nasal bones, subsequent encounter for fracture with routine healing: Secondary | ICD-10-CM | POA: Diagnosis not present

## 2017-05-02 DIAGNOSIS — S52502D Unspecified fracture of the lower end of left radius, subsequent encounter for closed fracture with routine healing: Secondary | ICD-10-CM | POA: Diagnosis not present

## 2017-05-02 NOTE — Telephone Encounter (Signed)
[facility] with [facility]: pt fell on Monday.  She has a bruise above her right eye and on her hand. Otherwise she is ok.  Just wanted dr to know

## 2017-05-02 NOTE — Telephone Encounter (Signed)
Already addressed by MD (see previous phone note).

## 2017-05-02 NOTE — Telephone Encounter (Signed)
No available appointments left for today or tomm, FYI to MD.

## 2017-05-03 DIAGNOSIS — D649 Anemia, unspecified: Secondary | ICD-10-CM | POA: Diagnosis not present

## 2017-05-03 DIAGNOSIS — F419 Anxiety disorder, unspecified: Secondary | ICD-10-CM | POA: Diagnosis not present

## 2017-05-03 DIAGNOSIS — Z792 Long term (current) use of antibiotics: Secondary | ICD-10-CM | POA: Diagnosis not present

## 2017-05-03 DIAGNOSIS — F0390 Unspecified dementia without behavioral disturbance: Secondary | ICD-10-CM | POA: Diagnosis not present

## 2017-05-03 DIAGNOSIS — S52502D Unspecified fracture of the lower end of left radius, subsequent encounter for closed fracture with routine healing: Secondary | ICD-10-CM | POA: Diagnosis not present

## 2017-05-03 DIAGNOSIS — Z7982 Long term (current) use of aspirin: Secondary | ICD-10-CM | POA: Diagnosis not present

## 2017-05-03 DIAGNOSIS — N390 Urinary tract infection, site not specified: Secondary | ICD-10-CM | POA: Diagnosis not present

## 2017-05-03 DIAGNOSIS — S022XXD Fracture of nasal bones, subsequent encounter for fracture with routine healing: Secondary | ICD-10-CM | POA: Diagnosis not present

## 2017-05-03 DIAGNOSIS — B961 Klebsiella pneumoniae [K. pneumoniae] as the cause of diseases classified elsewhere: Secondary | ICD-10-CM | POA: Diagnosis not present

## 2017-05-06 ENCOUNTER — Telehealth: Payer: Self-pay | Admitting: *Deleted

## 2017-05-06 ENCOUNTER — Ambulatory Visit: Payer: Self-pay | Admitting: Family Medicine

## 2017-05-06 NOTE — Telephone Encounter (Signed)
Joan FloresPatricia Kinway, RN with [facility] left voice message on nurse's line 05/03/17 that she completed home health assessment, however unable to get a urine sample. She left urine hat for patient.  She was going to return to patient's home later on in the day to see if patient was able to provide a sample.  Please give her a call at [phone] with questions.  Clovis PuMartin, Tamika L, RN

## 2017-05-07 ENCOUNTER — Ambulatory Visit (INDEPENDENT_AMBULATORY_CARE_PROVIDER_SITE_OTHER): Payer: Medicare HMO | Admitting: Family Medicine

## 2017-05-07 VITALS — BP 140/78 | HR 80 | Temp 98.6°F | Ht 66.0 in | Wt 121.0 lb

## 2017-05-07 DIAGNOSIS — M79601 Pain in right arm: Secondary | ICD-10-CM | POA: Insufficient documentation

## 2017-05-07 DIAGNOSIS — F0390 Unspecified dementia without behavioral disturbance: Secondary | ICD-10-CM | POA: Diagnosis not present

## 2017-05-07 DIAGNOSIS — S52502D Unspecified fracture of the lower end of left radius, subsequent encounter for closed fracture with routine healing: Secondary | ICD-10-CM | POA: Diagnosis not present

## 2017-05-07 DIAGNOSIS — N390 Urinary tract infection, site not specified: Secondary | ICD-10-CM | POA: Diagnosis not present

## 2017-05-07 DIAGNOSIS — F419 Anxiety disorder, unspecified: Secondary | ICD-10-CM

## 2017-05-07 DIAGNOSIS — R630 Anorexia: Secondary | ICD-10-CM

## 2017-05-07 DIAGNOSIS — S022XXD Fracture of nasal bones, subsequent encounter for fracture with routine healing: Secondary | ICD-10-CM | POA: Diagnosis not present

## 2017-05-07 DIAGNOSIS — W19XXXA Unspecified fall, initial encounter: Secondary | ICD-10-CM

## 2017-05-07 DIAGNOSIS — Z792 Long term (current) use of antibiotics: Secondary | ICD-10-CM | POA: Diagnosis not present

## 2017-05-07 DIAGNOSIS — B961 Klebsiella pneumoniae [K. pneumoniae] as the cause of diseases classified elsewhere: Secondary | ICD-10-CM | POA: Diagnosis not present

## 2017-05-07 DIAGNOSIS — D649 Anemia, unspecified: Secondary | ICD-10-CM | POA: Diagnosis not present

## 2017-05-07 DIAGNOSIS — Z7982 Long term (current) use of aspirin: Secondary | ICD-10-CM | POA: Diagnosis not present

## 2017-05-07 HISTORY — DX: Anorexia: R63.0

## 2017-05-07 MED ORDER — MIRTAZAPINE 15 MG PO TABS: 15.0000 mg | ORAL_TABLET | Freq: Every day | ORAL | 5 refills | Status: DC

## 2017-05-07 NOTE — Patient Instructions (Signed)
It was a pleasure seeing you today in our clinic. Today we discussed your anxiety and fall. Here is the treatment plan we have discussed and agreed upon together:   - I've started you on Remeron. Take 1 tablet daily before bed. - I have placed an order for x-rays to be taken on your right arm, to assess for any fracture you may have obtained when you fell. - I have placed an order for x-rays to be taken on your left arm to assess for any delayed wound healing from your previous fracture. - I would like to see you back in the "geriatric clinic" within the next 4-6 weeks. Please call and schedule an appointment requesting time in the "geriatric clinic".

## 2017-05-07 NOTE — Telephone Encounter (Signed)
Ok thank you. I will be seeing this patient later today, and will address some of these issues.

## 2017-05-07 NOTE — Assessment & Plan Note (Signed)
See plan above.
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
## 2017-05-07 NOTE — Assessment & Plan Note (Signed)
Worsening anxiety. Patient is no longer on her SSRI. This, I am actually okay with as evidence suggests that SSRIs can increase mortality in the elderly. Patient is also having issues with weight loss and decreased appetite. - Initiate Remeron 15 mg every night - Follow-up in 4-6 weeks
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
## 2017-05-08 DIAGNOSIS — Z792 Long term (current) use of antibiotics: Secondary | ICD-10-CM | POA: Diagnosis not present

## 2017-05-08 DIAGNOSIS — F0390 Unspecified dementia without behavioral disturbance: Secondary | ICD-10-CM | POA: Diagnosis not present

## 2017-05-08 DIAGNOSIS — F419 Anxiety disorder, unspecified: Secondary | ICD-10-CM | POA: Diagnosis not present

## 2017-05-08 DIAGNOSIS — S52502D Unspecified fracture of the lower end of left radius, subsequent encounter for closed fracture with routine healing: Secondary | ICD-10-CM | POA: Diagnosis not present

## 2017-05-08 DIAGNOSIS — Z7982 Long term (current) use of aspirin: Secondary | ICD-10-CM | POA: Diagnosis not present

## 2017-05-08 DIAGNOSIS — N390 Urinary tract infection, site not specified: Secondary | ICD-10-CM | POA: Diagnosis not present

## 2017-05-08 DIAGNOSIS — B961 Klebsiella pneumoniae [K. pneumoniae] as the cause of diseases classified elsewhere: Secondary | ICD-10-CM | POA: Diagnosis not present

## 2017-05-08 DIAGNOSIS — D649 Anemia, unspecified: Secondary | ICD-10-CM | POA: Diagnosis not present

## 2017-05-08 DIAGNOSIS — S022XXD Fracture of nasal bones, subsequent encounter for fracture with routine healing: Secondary | ICD-10-CM | POA: Diagnosis not present

## 2017-05-09 DIAGNOSIS — F0390 Unspecified dementia without behavioral disturbance: Secondary | ICD-10-CM | POA: Diagnosis not present

## 2017-05-09 DIAGNOSIS — S52502D Unspecified fracture of the lower end of left radius, subsequent encounter for closed fracture with routine healing: Secondary | ICD-10-CM | POA: Diagnosis not present

## 2017-05-09 DIAGNOSIS — S022XXD Fracture of nasal bones, subsequent encounter for fracture with routine healing: Secondary | ICD-10-CM | POA: Diagnosis not present

## 2017-05-09 DIAGNOSIS — B961 Klebsiella pneumoniae [K. pneumoniae] as the cause of diseases classified elsewhere: Secondary | ICD-10-CM | POA: Diagnosis not present

## 2017-05-09 DIAGNOSIS — N390 Urinary tract infection, site not specified: Secondary | ICD-10-CM | POA: Diagnosis not present

## 2017-05-09 DIAGNOSIS — D649 Anemia, unspecified: Secondary | ICD-10-CM | POA: Diagnosis not present

## 2017-05-09 DIAGNOSIS — F419 Anxiety disorder, unspecified: Secondary | ICD-10-CM | POA: Diagnosis not present

## 2017-05-09 DIAGNOSIS — Z7982 Long term (current) use of aspirin: Secondary | ICD-10-CM | POA: Diagnosis not present

## 2017-05-09 DIAGNOSIS — Z792 Long term (current) use of antibiotics: Secondary | ICD-10-CM | POA: Diagnosis not present

## 2017-05-10 ENCOUNTER — Other Ambulatory Visit: Payer: Self-pay | Admitting: Family Medicine

## 2017-05-10 ENCOUNTER — Telehealth: Payer: Self-pay | Admitting: *Deleted

## 2017-05-10 ENCOUNTER — Ambulatory Visit
Admission: RE | Admit: 2017-05-10 | Discharge: 2017-05-10 | Disposition: A | Discharge status: Home / Self Care | Payer: Medicare HMO | Source: Ambulatory Visit | Attending: Family Medicine | Admitting: Family Medicine

## 2017-05-10 DIAGNOSIS — S59901A Unspecified injury of right elbow, initial encounter: Secondary | ICD-10-CM | POA: Diagnosis not present

## 2017-05-10 DIAGNOSIS — S62336A Displaced fracture of neck of fifth metacarpal bone, right hand, initial encounter for closed fracture: Secondary | ICD-10-CM

## 2017-05-10 DIAGNOSIS — S6991XA Unspecified injury of right wrist, hand and finger(s), initial encounter: Secondary | ICD-10-CM | POA: Diagnosis not present

## 2017-05-10 DIAGNOSIS — S52502D Unspecified fracture of the lower end of left radius, subsequent encounter for closed fracture with routine healing: Secondary | ICD-10-CM

## 2017-05-10 DIAGNOSIS — M79601 Pain in right arm: Secondary | ICD-10-CM

## 2017-05-10 DIAGNOSIS — W19XXXA Unspecified fall, initial encounter: Secondary | ICD-10-CM

## 2017-05-10 DIAGNOSIS — S62306A Unspecified fracture of fifth metacarpal bone, right hand, initial encounter for closed fracture: Secondary | ICD-10-CM | POA: Diagnosis not present

## 2017-05-10 DIAGNOSIS — S62326A Displaced fracture of shaft of fifth metacarpal bone, right hand, initial encounter for closed fracture: Secondary | ICD-10-CM | POA: Diagnosis not present

## 2017-05-10 DIAGNOSIS — S6292XA Unspecified fracture of left wrist and hand, initial encounter for closed fracture: Secondary | ICD-10-CM | POA: Diagnosis not present

## 2017-05-10 NOTE — Telephone Encounter (Signed)
Call report by Julie [facility]:  Right hand xray showed mildly comminuted and displaced fracture of the right fifth metacarpal neck with mild apex dorsal angulation.  Left wrist follow up showed no evidence of fracture. Interval healing of distal radial fracture, hyperextension of interphalangeal joint of the thumb again noted, this is unchanged.  The other x-rays performed today were negative.  Copies of these results have also been faxed over. Sharetha Newson,CMA

## 2017-05-10 NOTE — Telephone Encounter (Signed)
Contacted patient with results. Informed her of the need to have this immobilized. Patient aware that referral has been placed. I also informed her that if she is unable to make an appointment soon, she expressed some difficulty with transportation, then she could go to an UC/ED to receive a splint.   Also encouraged continued icing PRN.  Patient expressed understanding. Reported no new symptoms. No significant pain.

## 2017-05-13 DIAGNOSIS — D649 Anemia, unspecified: Secondary | ICD-10-CM | POA: Diagnosis not present

## 2017-05-13 DIAGNOSIS — N390 Urinary tract infection, site not specified: Secondary | ICD-10-CM | POA: Diagnosis not present

## 2017-05-13 DIAGNOSIS — Z7982 Long term (current) use of aspirin: Secondary | ICD-10-CM | POA: Diagnosis not present

## 2017-05-13 DIAGNOSIS — S52502D Unspecified fracture of the lower end of left radius, subsequent encounter for closed fracture with routine healing: Secondary | ICD-10-CM | POA: Diagnosis not present

## 2017-05-13 DIAGNOSIS — S022XXD Fracture of nasal bones, subsequent encounter for fracture with routine healing: Secondary | ICD-10-CM | POA: Diagnosis not present

## 2017-05-13 DIAGNOSIS — F0390 Unspecified dementia without behavioral disturbance: Secondary | ICD-10-CM | POA: Diagnosis not present

## 2017-05-13 DIAGNOSIS — F419 Anxiety disorder, unspecified: Secondary | ICD-10-CM | POA: Diagnosis not present

## 2017-05-13 DIAGNOSIS — B961 Klebsiella pneumoniae [K. pneumoniae] as the cause of diseases classified elsewhere: Secondary | ICD-10-CM | POA: Diagnosis not present

## 2017-05-13 DIAGNOSIS — Z792 Long term (current) use of antibiotics: Secondary | ICD-10-CM | POA: Diagnosis not present

## 2017-05-14 ENCOUNTER — Telehealth: Payer: Self-pay | Admitting: *Deleted

## 2017-05-14 DIAGNOSIS — S022XXD Fracture of nasal bones, subsequent encounter for fracture with routine healing: Secondary | ICD-10-CM | POA: Diagnosis not present

## 2017-05-14 DIAGNOSIS — D649 Anemia, unspecified: Secondary | ICD-10-CM | POA: Diagnosis not present

## 2017-05-14 DIAGNOSIS — S52502D Unspecified fracture of the lower end of left radius, subsequent encounter for closed fracture with routine healing: Secondary | ICD-10-CM | POA: Diagnosis not present

## 2017-05-14 DIAGNOSIS — B961 Klebsiella pneumoniae [K. pneumoniae] as the cause of diseases classified elsewhere: Secondary | ICD-10-CM | POA: Diagnosis not present

## 2017-05-14 DIAGNOSIS — Z7982 Long term (current) use of aspirin: Secondary | ICD-10-CM | POA: Diagnosis not present

## 2017-05-14 DIAGNOSIS — F419 Anxiety disorder, unspecified: Secondary | ICD-10-CM | POA: Diagnosis not present

## 2017-05-14 DIAGNOSIS — F0390 Unspecified dementia without behavioral disturbance: Secondary | ICD-10-CM | POA: Diagnosis not present

## 2017-05-14 DIAGNOSIS — N390 Urinary tract infection, site not specified: Secondary | ICD-10-CM | POA: Diagnosis not present

## 2017-05-14 DIAGNOSIS — Z792 Long term (current) use of antibiotics: Secondary | ICD-10-CM | POA: Diagnosis not present

## 2017-05-14 NOTE — Telephone Encounter (Signed)
[facility].  She is requesting 2x week for next 4 weeks. You can call her @ [phone]  Olivia Robertson also wanted to let MD know that since starting the new anxiety medication, she can tell a difference.  Kendrea Cerritos, Maryjo RochesterJessica Dawn, CMA

## 2017-05-15 NOTE — Telephone Encounter (Signed)
Called. Left VM. Approved increased duration.

## 2017-05-16 DIAGNOSIS — F419 Anxiety disorder, unspecified: Secondary | ICD-10-CM | POA: Diagnosis not present

## 2017-05-16 DIAGNOSIS — S52502D Unspecified fracture of the lower end of left radius, subsequent encounter for closed fracture with routine healing: Secondary | ICD-10-CM | POA: Diagnosis not present

## 2017-05-16 DIAGNOSIS — Z792 Long term (current) use of antibiotics: Secondary | ICD-10-CM | POA: Diagnosis not present

## 2017-05-16 DIAGNOSIS — N390 Urinary tract infection, site not specified: Secondary | ICD-10-CM | POA: Diagnosis not present

## 2017-05-16 DIAGNOSIS — D649 Anemia, unspecified: Secondary | ICD-10-CM | POA: Diagnosis not present

## 2017-05-16 DIAGNOSIS — S62336A Displaced fracture of neck of fifth metacarpal bone, right hand, initial encounter for closed fracture: Secondary | ICD-10-CM | POA: Diagnosis not present

## 2017-05-16 DIAGNOSIS — F0390 Unspecified dementia without behavioral disturbance: Secondary | ICD-10-CM | POA: Diagnosis not present

## 2017-05-16 DIAGNOSIS — B961 Klebsiella pneumoniae [K. pneumoniae] as the cause of diseases classified elsewhere: Secondary | ICD-10-CM | POA: Diagnosis not present

## 2017-05-16 DIAGNOSIS — Z7982 Long term (current) use of aspirin: Secondary | ICD-10-CM | POA: Diagnosis not present

## 2017-05-16 DIAGNOSIS — S022XXD Fracture of nasal bones, subsequent encounter for fracture with routine healing: Secondary | ICD-10-CM | POA: Diagnosis not present

## 2017-05-21 DIAGNOSIS — F0390 Unspecified dementia without behavioral disturbance: Secondary | ICD-10-CM | POA: Diagnosis not present

## 2017-05-21 DIAGNOSIS — F419 Anxiety disorder, unspecified: Secondary | ICD-10-CM | POA: Diagnosis not present

## 2017-05-21 DIAGNOSIS — Z7982 Long term (current) use of aspirin: Secondary | ICD-10-CM | POA: Diagnosis not present

## 2017-05-21 DIAGNOSIS — B961 Klebsiella pneumoniae [K. pneumoniae] as the cause of diseases classified elsewhere: Secondary | ICD-10-CM | POA: Diagnosis not present

## 2017-05-21 DIAGNOSIS — N390 Urinary tract infection, site not specified: Secondary | ICD-10-CM | POA: Diagnosis not present

## 2017-05-21 DIAGNOSIS — Z792 Long term (current) use of antibiotics: Secondary | ICD-10-CM | POA: Diagnosis not present

## 2017-05-21 DIAGNOSIS — D649 Anemia, unspecified: Secondary | ICD-10-CM | POA: Diagnosis not present

## 2017-05-21 DIAGNOSIS — S52502D Unspecified fracture of the lower end of left radius, subsequent encounter for closed fracture with routine healing: Secondary | ICD-10-CM | POA: Diagnosis not present

## 2017-05-21 DIAGNOSIS — S022XXD Fracture of nasal bones, subsequent encounter for fracture with routine healing: Secondary | ICD-10-CM | POA: Diagnosis not present

## 2017-05-22 DIAGNOSIS — B961 Klebsiella pneumoniae [K. pneumoniae] as the cause of diseases classified elsewhere: Secondary | ICD-10-CM | POA: Diagnosis not present

## 2017-05-22 DIAGNOSIS — Z7982 Long term (current) use of aspirin: Secondary | ICD-10-CM | POA: Diagnosis not present

## 2017-05-22 DIAGNOSIS — F0390 Unspecified dementia without behavioral disturbance: Secondary | ICD-10-CM | POA: Diagnosis not present

## 2017-05-22 DIAGNOSIS — F419 Anxiety disorder, unspecified: Secondary | ICD-10-CM | POA: Diagnosis not present

## 2017-05-22 DIAGNOSIS — D649 Anemia, unspecified: Secondary | ICD-10-CM | POA: Diagnosis not present

## 2017-05-22 DIAGNOSIS — S022XXD Fracture of nasal bones, subsequent encounter for fracture with routine healing: Secondary | ICD-10-CM | POA: Diagnosis not present

## 2017-05-22 DIAGNOSIS — N390 Urinary tract infection, site not specified: Secondary | ICD-10-CM | POA: Diagnosis not present

## 2017-05-22 DIAGNOSIS — S52502D Unspecified fracture of the lower end of left radius, subsequent encounter for closed fracture with routine healing: Secondary | ICD-10-CM | POA: Diagnosis not present

## 2017-05-22 DIAGNOSIS — Z792 Long term (current) use of antibiotics: Secondary | ICD-10-CM | POA: Diagnosis not present

## 2017-05-23 DIAGNOSIS — Z7982 Long term (current) use of aspirin: Secondary | ICD-10-CM | POA: Diagnosis not present

## 2017-05-23 DIAGNOSIS — S52502D Unspecified fracture of the lower end of left radius, subsequent encounter for closed fracture with routine healing: Secondary | ICD-10-CM | POA: Diagnosis not present

## 2017-05-23 DIAGNOSIS — B961 Klebsiella pneumoniae [K. pneumoniae] as the cause of diseases classified elsewhere: Secondary | ICD-10-CM | POA: Diagnosis not present

## 2017-05-23 DIAGNOSIS — D649 Anemia, unspecified: Secondary | ICD-10-CM | POA: Diagnosis not present

## 2017-05-23 DIAGNOSIS — F419 Anxiety disorder, unspecified: Secondary | ICD-10-CM | POA: Diagnosis not present

## 2017-05-23 DIAGNOSIS — S022XXD Fracture of nasal bones, subsequent encounter for fracture with routine healing: Secondary | ICD-10-CM | POA: Diagnosis not present

## 2017-05-23 DIAGNOSIS — F0390 Unspecified dementia without behavioral disturbance: Secondary | ICD-10-CM | POA: Diagnosis not present

## 2017-05-23 DIAGNOSIS — Z792 Long term (current) use of antibiotics: Secondary | ICD-10-CM | POA: Diagnosis not present

## 2017-05-23 DIAGNOSIS — N390 Urinary tract infection, site not specified: Secondary | ICD-10-CM | POA: Diagnosis not present

## 2017-05-24 DIAGNOSIS — S022XXD Fracture of nasal bones, subsequent encounter for fracture with routine healing: Secondary | ICD-10-CM | POA: Diagnosis not present

## 2017-05-24 DIAGNOSIS — B961 Klebsiella pneumoniae [K. pneumoniae] as the cause of diseases classified elsewhere: Secondary | ICD-10-CM | POA: Diagnosis not present

## 2017-05-24 DIAGNOSIS — N390 Urinary tract infection, site not specified: Secondary | ICD-10-CM | POA: Diagnosis not present

## 2017-05-24 DIAGNOSIS — S52502D Unspecified fracture of the lower end of left radius, subsequent encounter for closed fracture with routine healing: Secondary | ICD-10-CM | POA: Diagnosis not present

## 2017-05-24 DIAGNOSIS — F0390 Unspecified dementia without behavioral disturbance: Secondary | ICD-10-CM | POA: Diagnosis not present

## 2017-05-24 DIAGNOSIS — Z7982 Long term (current) use of aspirin: Secondary | ICD-10-CM | POA: Diagnosis not present

## 2017-05-24 DIAGNOSIS — F419 Anxiety disorder, unspecified: Secondary | ICD-10-CM | POA: Diagnosis not present

## 2017-05-24 DIAGNOSIS — Z792 Long term (current) use of antibiotics: Secondary | ICD-10-CM | POA: Diagnosis not present

## 2017-05-24 DIAGNOSIS — D649 Anemia, unspecified: Secondary | ICD-10-CM | POA: Diagnosis not present

## 2017-05-28 ENCOUNTER — Telehealth: Payer: Self-pay | Admitting: Family Medicine

## 2017-05-28 DIAGNOSIS — S52502D Unspecified fracture of the lower end of left radius, subsequent encounter for closed fracture with routine healing: Secondary | ICD-10-CM | POA: Diagnosis not present

## 2017-05-28 DIAGNOSIS — D649 Anemia, unspecified: Secondary | ICD-10-CM | POA: Diagnosis not present

## 2017-05-28 DIAGNOSIS — B961 Klebsiella pneumoniae [K. pneumoniae] as the cause of diseases classified elsewhere: Secondary | ICD-10-CM | POA: Diagnosis not present

## 2017-05-28 DIAGNOSIS — N390 Urinary tract infection, site not specified: Secondary | ICD-10-CM | POA: Diagnosis not present

## 2017-05-28 DIAGNOSIS — F419 Anxiety disorder, unspecified: Secondary | ICD-10-CM | POA: Diagnosis not present

## 2017-05-28 DIAGNOSIS — Z7982 Long term (current) use of aspirin: Secondary | ICD-10-CM | POA: Diagnosis not present

## 2017-05-28 DIAGNOSIS — S022XXD Fracture of nasal bones, subsequent encounter for fracture with routine healing: Secondary | ICD-10-CM | POA: Diagnosis not present

## 2017-05-28 DIAGNOSIS — F0390 Unspecified dementia without behavioral disturbance: Secondary | ICD-10-CM | POA: Diagnosis not present

## 2017-05-28 DIAGNOSIS — Z792 Long term (current) use of antibiotics: Secondary | ICD-10-CM | POA: Diagnosis not present

## 2017-05-28 NOTE — Telephone Encounter (Signed)
Pt reported a fall this morning. No injury. Vitals are normal. Some left hip pain when flexing. No bruising. ep

## 2017-05-30 ENCOUNTER — Telehealth: Payer: Self-pay | Admitting: Family Medicine

## 2017-05-30 DIAGNOSIS — F419 Anxiety disorder, unspecified: Secondary | ICD-10-CM | POA: Diagnosis not present

## 2017-05-30 DIAGNOSIS — N390 Urinary tract infection, site not specified: Secondary | ICD-10-CM | POA: Diagnosis not present

## 2017-05-30 DIAGNOSIS — D649 Anemia, unspecified: Secondary | ICD-10-CM | POA: Diagnosis not present

## 2017-05-30 DIAGNOSIS — S022XXD Fracture of nasal bones, subsequent encounter for fracture with routine healing: Secondary | ICD-10-CM | POA: Diagnosis not present

## 2017-05-30 DIAGNOSIS — S52502D Unspecified fracture of the lower end of left radius, subsequent encounter for closed fracture with routine healing: Secondary | ICD-10-CM | POA: Diagnosis not present

## 2017-05-30 DIAGNOSIS — Z7982 Long term (current) use of aspirin: Secondary | ICD-10-CM | POA: Diagnosis not present

## 2017-05-30 DIAGNOSIS — B961 Klebsiella pneumoniae [K. pneumoniae] as the cause of diseases classified elsewhere: Secondary | ICD-10-CM | POA: Diagnosis not present

## 2017-05-30 DIAGNOSIS — F0390 Unspecified dementia without behavioral disturbance: Secondary | ICD-10-CM | POA: Diagnosis not present

## 2017-05-30 DIAGNOSIS — Z792 Long term (current) use of antibiotics: Secondary | ICD-10-CM | POA: Diagnosis not present

## 2017-05-30 NOTE — Telephone Encounter (Signed)
Called >> agreed w/ plan. Orders given.

## 2017-05-30 NOTE — Telephone Encounter (Signed)
Olivia Robertson would like orders for re-certification. Nurse visits once a week for 8 weeks. Olivia Robertson would also like orders for speech evaluation and treat. Olivia Robertson says if you can't reach her on work cell to call her personal cell [phone] and if you leave a vm on personal cell just use pt's initials. ep

## 2017-06-03 DIAGNOSIS — S022XXD Fracture of nasal bones, subsequent encounter for fracture with routine healing: Secondary | ICD-10-CM | POA: Diagnosis not present

## 2017-06-03 DIAGNOSIS — D649 Anemia, unspecified: Secondary | ICD-10-CM | POA: Diagnosis not present

## 2017-06-03 DIAGNOSIS — F4310 Post-traumatic stress disorder, unspecified: Secondary | ICD-10-CM | POA: Diagnosis not present

## 2017-06-03 DIAGNOSIS — F0390 Unspecified dementia without behavioral disturbance: Secondary | ICD-10-CM | POA: Diagnosis not present

## 2017-06-03 DIAGNOSIS — I10 Essential (primary) hypertension: Secondary | ICD-10-CM | POA: Diagnosis not present

## 2017-06-03 DIAGNOSIS — Z87440 Personal history of urinary (tract) infections: Secondary | ICD-10-CM | POA: Diagnosis not present

## 2017-06-03 DIAGNOSIS — S52502D Unspecified fracture of the lower end of left radius, subsequent encounter for closed fracture with routine healing: Secondary | ICD-10-CM | POA: Diagnosis not present

## 2017-06-03 DIAGNOSIS — E785 Hyperlipidemia, unspecified: Secondary | ICD-10-CM | POA: Diagnosis not present

## 2017-06-03 DIAGNOSIS — F411 Generalized anxiety disorder: Secondary | ICD-10-CM | POA: Diagnosis not present

## 2017-06-04 DIAGNOSIS — D649 Anemia, unspecified: Secondary | ICD-10-CM | POA: Diagnosis not present

## 2017-06-04 DIAGNOSIS — F4310 Post-traumatic stress disorder, unspecified: Secondary | ICD-10-CM | POA: Diagnosis not present

## 2017-06-04 DIAGNOSIS — S022XXD Fracture of nasal bones, subsequent encounter for fracture with routine healing: Secondary | ICD-10-CM | POA: Diagnosis not present

## 2017-06-04 DIAGNOSIS — F411 Generalized anxiety disorder: Secondary | ICD-10-CM | POA: Diagnosis not present

## 2017-06-04 DIAGNOSIS — I10 Essential (primary) hypertension: Secondary | ICD-10-CM | POA: Diagnosis not present

## 2017-06-04 DIAGNOSIS — Z87440 Personal history of urinary (tract) infections: Secondary | ICD-10-CM | POA: Diagnosis not present

## 2017-06-04 DIAGNOSIS — S52502D Unspecified fracture of the lower end of left radius, subsequent encounter for closed fracture with routine healing: Secondary | ICD-10-CM | POA: Diagnosis not present

## 2017-06-04 DIAGNOSIS — F0390 Unspecified dementia without behavioral disturbance: Secondary | ICD-10-CM | POA: Diagnosis not present

## 2017-06-04 DIAGNOSIS — E785 Hyperlipidemia, unspecified: Secondary | ICD-10-CM | POA: Diagnosis not present

## 2017-06-05 DIAGNOSIS — S52502D Unspecified fracture of the lower end of left radius, subsequent encounter for closed fracture with routine healing: Secondary | ICD-10-CM | POA: Diagnosis not present

## 2017-06-05 DIAGNOSIS — Z87440 Personal history of urinary (tract) infections: Secondary | ICD-10-CM | POA: Diagnosis not present

## 2017-06-05 DIAGNOSIS — F411 Generalized anxiety disorder: Secondary | ICD-10-CM | POA: Diagnosis not present

## 2017-06-05 DIAGNOSIS — F0390 Unspecified dementia without behavioral disturbance: Secondary | ICD-10-CM | POA: Diagnosis not present

## 2017-06-05 DIAGNOSIS — E785 Hyperlipidemia, unspecified: Secondary | ICD-10-CM | POA: Diagnosis not present

## 2017-06-05 DIAGNOSIS — I10 Essential (primary) hypertension: Secondary | ICD-10-CM | POA: Diagnosis not present

## 2017-06-05 DIAGNOSIS — S022XXD Fracture of nasal bones, subsequent encounter for fracture with routine healing: Secondary | ICD-10-CM | POA: Diagnosis not present

## 2017-06-05 DIAGNOSIS — F4310 Post-traumatic stress disorder, unspecified: Secondary | ICD-10-CM | POA: Diagnosis not present

## 2017-06-05 DIAGNOSIS — D649 Anemia, unspecified: Secondary | ICD-10-CM | POA: Diagnosis not present

## 2017-06-06 DIAGNOSIS — F4310 Post-traumatic stress disorder, unspecified: Secondary | ICD-10-CM | POA: Diagnosis not present

## 2017-06-06 DIAGNOSIS — F0390 Unspecified dementia without behavioral disturbance: Secondary | ICD-10-CM | POA: Diagnosis not present

## 2017-06-06 DIAGNOSIS — D649 Anemia, unspecified: Secondary | ICD-10-CM | POA: Diagnosis not present

## 2017-06-06 DIAGNOSIS — I10 Essential (primary) hypertension: Secondary | ICD-10-CM | POA: Diagnosis not present

## 2017-06-06 DIAGNOSIS — Z87440 Personal history of urinary (tract) infections: Secondary | ICD-10-CM | POA: Diagnosis not present

## 2017-06-06 DIAGNOSIS — F411 Generalized anxiety disorder: Secondary | ICD-10-CM | POA: Diagnosis not present

## 2017-06-06 DIAGNOSIS — S52502D Unspecified fracture of the lower end of left radius, subsequent encounter for closed fracture with routine healing: Secondary | ICD-10-CM | POA: Diagnosis not present

## 2017-06-06 DIAGNOSIS — E785 Hyperlipidemia, unspecified: Secondary | ICD-10-CM | POA: Diagnosis not present

## 2017-06-06 DIAGNOSIS — S022XXD Fracture of nasal bones, subsequent encounter for fracture with routine healing: Secondary | ICD-10-CM | POA: Diagnosis not present

## 2017-06-12 DIAGNOSIS — I10 Essential (primary) hypertension: Secondary | ICD-10-CM | POA: Diagnosis not present

## 2017-06-12 DIAGNOSIS — D649 Anemia, unspecified: Secondary | ICD-10-CM | POA: Diagnosis not present

## 2017-06-12 DIAGNOSIS — Z87440 Personal history of urinary (tract) infections: Secondary | ICD-10-CM | POA: Diagnosis not present

## 2017-06-12 DIAGNOSIS — F0390 Unspecified dementia without behavioral disturbance: Secondary | ICD-10-CM | POA: Diagnosis not present

## 2017-06-12 DIAGNOSIS — F411 Generalized anxiety disorder: Secondary | ICD-10-CM | POA: Diagnosis not present

## 2017-06-12 DIAGNOSIS — F4310 Post-traumatic stress disorder, unspecified: Secondary | ICD-10-CM | POA: Diagnosis not present

## 2017-06-12 DIAGNOSIS — S52502D Unspecified fracture of the lower end of left radius, subsequent encounter for closed fracture with routine healing: Secondary | ICD-10-CM | POA: Diagnosis not present

## 2017-06-12 DIAGNOSIS — E785 Hyperlipidemia, unspecified: Secondary | ICD-10-CM | POA: Diagnosis not present

## 2017-06-12 DIAGNOSIS — S022XXD Fracture of nasal bones, subsequent encounter for fracture with routine healing: Secondary | ICD-10-CM | POA: Diagnosis not present

## 2017-06-13 ENCOUNTER — Telehealth: Payer: Self-pay | Admitting: *Deleted

## 2017-06-13 DIAGNOSIS — Z87440 Personal history of urinary (tract) infections: Secondary | ICD-10-CM | POA: Diagnosis not present

## 2017-06-13 DIAGNOSIS — F4310 Post-traumatic stress disorder, unspecified: Secondary | ICD-10-CM | POA: Diagnosis not present

## 2017-06-13 DIAGNOSIS — D649 Anemia, unspecified: Secondary | ICD-10-CM | POA: Diagnosis not present

## 2017-06-13 DIAGNOSIS — F0390 Unspecified dementia without behavioral disturbance: Secondary | ICD-10-CM | POA: Diagnosis not present

## 2017-06-13 DIAGNOSIS — I10 Essential (primary) hypertension: Secondary | ICD-10-CM | POA: Diagnosis not present

## 2017-06-13 DIAGNOSIS — S52502D Unspecified fracture of the lower end of left radius, subsequent encounter for closed fracture with routine healing: Secondary | ICD-10-CM | POA: Diagnosis not present

## 2017-06-13 DIAGNOSIS — F411 Generalized anxiety disorder: Secondary | ICD-10-CM | POA: Diagnosis not present

## 2017-06-13 DIAGNOSIS — E785 Hyperlipidemia, unspecified: Secondary | ICD-10-CM | POA: Diagnosis not present

## 2017-06-13 DIAGNOSIS — S022XXD Fracture of nasal bones, subsequent encounter for fracture with routine healing: Secondary | ICD-10-CM | POA: Diagnosis not present

## 2017-06-13 NOTE — Telephone Encounter (Signed)
Elease HashimotoPatricia, RN with [facility] called to request verbal order for home health aid.  Home Health aid is needed for twice a week for 6 weeks. Please call [phone] ok to leave a message.  Clovis PuMartin, Abagael Kramm L, RN

## 2017-06-14 DIAGNOSIS — S022XXD Fracture of nasal bones, subsequent encounter for fracture with routine healing: Secondary | ICD-10-CM | POA: Diagnosis not present

## 2017-06-14 DIAGNOSIS — D649 Anemia, unspecified: Secondary | ICD-10-CM | POA: Diagnosis not present

## 2017-06-14 DIAGNOSIS — S52502D Unspecified fracture of the lower end of left radius, subsequent encounter for closed fracture with routine healing: Secondary | ICD-10-CM | POA: Diagnosis not present

## 2017-06-14 DIAGNOSIS — F4310 Post-traumatic stress disorder, unspecified: Secondary | ICD-10-CM | POA: Diagnosis not present

## 2017-06-14 DIAGNOSIS — F0390 Unspecified dementia without behavioral disturbance: Secondary | ICD-10-CM | POA: Diagnosis not present

## 2017-06-14 DIAGNOSIS — E785 Hyperlipidemia, unspecified: Secondary | ICD-10-CM | POA: Diagnosis not present

## 2017-06-14 DIAGNOSIS — F411 Generalized anxiety disorder: Secondary | ICD-10-CM | POA: Diagnosis not present

## 2017-06-14 DIAGNOSIS — I10 Essential (primary) hypertension: Secondary | ICD-10-CM | POA: Diagnosis not present

## 2017-06-14 DIAGNOSIS — Z87440 Personal history of urinary (tract) infections: Secondary | ICD-10-CM | POA: Diagnosis not present

## 2017-06-14 NOTE — Telephone Encounter (Signed)
Called. Left VM confirming order.

## 2017-06-17 ENCOUNTER — Telehealth: Payer: Self-pay | Admitting: Family Medicine

## 2017-06-17 DIAGNOSIS — F0390 Unspecified dementia without behavioral disturbance: Secondary | ICD-10-CM | POA: Diagnosis not present

## 2017-06-17 DIAGNOSIS — S022XXD Fracture of nasal bones, subsequent encounter for fracture with routine healing: Secondary | ICD-10-CM | POA: Diagnosis not present

## 2017-06-17 DIAGNOSIS — S52502D Unspecified fracture of the lower end of left radius, subsequent encounter for closed fracture with routine healing: Secondary | ICD-10-CM | POA: Diagnosis not present

## 2017-06-17 DIAGNOSIS — F411 Generalized anxiety disorder: Secondary | ICD-10-CM | POA: Diagnosis not present

## 2017-06-17 DIAGNOSIS — Z87440 Personal history of urinary (tract) infections: Secondary | ICD-10-CM | POA: Diagnosis not present

## 2017-06-17 DIAGNOSIS — I10 Essential (primary) hypertension: Secondary | ICD-10-CM | POA: Diagnosis not present

## 2017-06-17 DIAGNOSIS — F4310 Post-traumatic stress disorder, unspecified: Secondary | ICD-10-CM | POA: Diagnosis not present

## 2017-06-17 DIAGNOSIS — D649 Anemia, unspecified: Secondary | ICD-10-CM | POA: Diagnosis not present

## 2017-06-17 DIAGNOSIS — E785 Hyperlipidemia, unspecified: Secondary | ICD-10-CM | POA: Diagnosis not present

## 2017-06-17 NOTE — Telephone Encounter (Signed)
Nicolette needs verbal orders to continue PT twice a week for four weeks. ep

## 2017-06-18 DIAGNOSIS — S62336A Displaced fracture of neck of fifth metacarpal bone, right hand, initial encounter for closed fracture: Secondary | ICD-10-CM | POA: Diagnosis not present

## 2017-06-18 NOTE — Telephone Encounter (Signed)
Verbal orders given  

## 2017-06-19 DIAGNOSIS — F4310 Post-traumatic stress disorder, unspecified: Secondary | ICD-10-CM | POA: Diagnosis not present

## 2017-06-19 DIAGNOSIS — I10 Essential (primary) hypertension: Secondary | ICD-10-CM | POA: Diagnosis not present

## 2017-06-19 DIAGNOSIS — D649 Anemia, unspecified: Secondary | ICD-10-CM | POA: Diagnosis not present

## 2017-06-19 DIAGNOSIS — F411 Generalized anxiety disorder: Secondary | ICD-10-CM | POA: Diagnosis not present

## 2017-06-19 DIAGNOSIS — F0390 Unspecified dementia without behavioral disturbance: Secondary | ICD-10-CM | POA: Diagnosis not present

## 2017-06-19 DIAGNOSIS — S52502D Unspecified fracture of the lower end of left radius, subsequent encounter for closed fracture with routine healing: Secondary | ICD-10-CM | POA: Diagnosis not present

## 2017-06-19 DIAGNOSIS — S022XXD Fracture of nasal bones, subsequent encounter for fracture with routine healing: Secondary | ICD-10-CM | POA: Diagnosis not present

## 2017-06-19 DIAGNOSIS — E785 Hyperlipidemia, unspecified: Secondary | ICD-10-CM | POA: Diagnosis not present

## 2017-06-19 DIAGNOSIS — Z87440 Personal history of urinary (tract) infections: Secondary | ICD-10-CM | POA: Diagnosis not present

## 2017-06-20 DIAGNOSIS — E785 Hyperlipidemia, unspecified: Secondary | ICD-10-CM | POA: Diagnosis not present

## 2017-06-20 DIAGNOSIS — Z87440 Personal history of urinary (tract) infections: Secondary | ICD-10-CM | POA: Diagnosis not present

## 2017-06-20 DIAGNOSIS — F411 Generalized anxiety disorder: Secondary | ICD-10-CM | POA: Diagnosis not present

## 2017-06-20 DIAGNOSIS — S022XXD Fracture of nasal bones, subsequent encounter for fracture with routine healing: Secondary | ICD-10-CM | POA: Diagnosis not present

## 2017-06-20 DIAGNOSIS — F0390 Unspecified dementia without behavioral disturbance: Secondary | ICD-10-CM | POA: Diagnosis not present

## 2017-06-20 DIAGNOSIS — S52502D Unspecified fracture of the lower end of left radius, subsequent encounter for closed fracture with routine healing: Secondary | ICD-10-CM | POA: Diagnosis not present

## 2017-06-20 DIAGNOSIS — F4310 Post-traumatic stress disorder, unspecified: Secondary | ICD-10-CM | POA: Diagnosis not present

## 2017-06-20 DIAGNOSIS — I10 Essential (primary) hypertension: Secondary | ICD-10-CM | POA: Diagnosis not present

## 2017-06-20 DIAGNOSIS — D649 Anemia, unspecified: Secondary | ICD-10-CM | POA: Diagnosis not present

## 2017-06-25 DIAGNOSIS — F4310 Post-traumatic stress disorder, unspecified: Secondary | ICD-10-CM | POA: Diagnosis not present

## 2017-06-25 DIAGNOSIS — Z87440 Personal history of urinary (tract) infections: Secondary | ICD-10-CM | POA: Diagnosis not present

## 2017-06-25 DIAGNOSIS — S52502D Unspecified fracture of the lower end of left radius, subsequent encounter for closed fracture with routine healing: Secondary | ICD-10-CM | POA: Diagnosis not present

## 2017-06-25 DIAGNOSIS — I10 Essential (primary) hypertension: Secondary | ICD-10-CM | POA: Diagnosis not present

## 2017-06-25 DIAGNOSIS — D649 Anemia, unspecified: Secondary | ICD-10-CM | POA: Diagnosis not present

## 2017-06-25 DIAGNOSIS — F411 Generalized anxiety disorder: Secondary | ICD-10-CM | POA: Diagnosis not present

## 2017-06-25 DIAGNOSIS — S022XXD Fracture of nasal bones, subsequent encounter for fracture with routine healing: Secondary | ICD-10-CM | POA: Diagnosis not present

## 2017-06-25 DIAGNOSIS — F0390 Unspecified dementia without behavioral disturbance: Secondary | ICD-10-CM | POA: Diagnosis not present

## 2017-06-25 DIAGNOSIS — E785 Hyperlipidemia, unspecified: Secondary | ICD-10-CM | POA: Diagnosis not present

## 2017-06-26 ENCOUNTER — Other Ambulatory Visit: Payer: Self-pay | Admitting: Family Medicine

## 2017-06-27 DIAGNOSIS — F411 Generalized anxiety disorder: Secondary | ICD-10-CM | POA: Diagnosis not present

## 2017-06-27 DIAGNOSIS — F0390 Unspecified dementia without behavioral disturbance: Secondary | ICD-10-CM | POA: Diagnosis not present

## 2017-06-27 DIAGNOSIS — E785 Hyperlipidemia, unspecified: Secondary | ICD-10-CM | POA: Diagnosis not present

## 2017-06-27 DIAGNOSIS — D649 Anemia, unspecified: Secondary | ICD-10-CM | POA: Diagnosis not present

## 2017-06-27 DIAGNOSIS — S52502D Unspecified fracture of the lower end of left radius, subsequent encounter for closed fracture with routine healing: Secondary | ICD-10-CM | POA: Diagnosis not present

## 2017-06-27 DIAGNOSIS — I10 Essential (primary) hypertension: Secondary | ICD-10-CM | POA: Diagnosis not present

## 2017-06-27 DIAGNOSIS — Z87440 Personal history of urinary (tract) infections: Secondary | ICD-10-CM | POA: Diagnosis not present

## 2017-06-27 DIAGNOSIS — S022XXD Fracture of nasal bones, subsequent encounter for fracture with routine healing: Secondary | ICD-10-CM | POA: Diagnosis not present

## 2017-06-27 DIAGNOSIS — F4310 Post-traumatic stress disorder, unspecified: Secondary | ICD-10-CM | POA: Diagnosis not present

## 2017-07-02 DIAGNOSIS — Z87440 Personal history of urinary (tract) infections: Secondary | ICD-10-CM | POA: Diagnosis not present

## 2017-07-02 DIAGNOSIS — I10 Essential (primary) hypertension: Secondary | ICD-10-CM | POA: Diagnosis not present

## 2017-07-02 DIAGNOSIS — S022XXD Fracture of nasal bones, subsequent encounter for fracture with routine healing: Secondary | ICD-10-CM | POA: Diagnosis not present

## 2017-07-02 DIAGNOSIS — D649 Anemia, unspecified: Secondary | ICD-10-CM | POA: Diagnosis not present

## 2017-07-02 DIAGNOSIS — F4310 Post-traumatic stress disorder, unspecified: Secondary | ICD-10-CM | POA: Diagnosis not present

## 2017-07-02 DIAGNOSIS — S52502D Unspecified fracture of the lower end of left radius, subsequent encounter for closed fracture with routine healing: Secondary | ICD-10-CM | POA: Diagnosis not present

## 2017-07-02 DIAGNOSIS — E785 Hyperlipidemia, unspecified: Secondary | ICD-10-CM | POA: Diagnosis not present

## 2017-07-02 DIAGNOSIS — F411 Generalized anxiety disorder: Secondary | ICD-10-CM | POA: Diagnosis not present

## 2017-07-02 DIAGNOSIS — F0390 Unspecified dementia without behavioral disturbance: Secondary | ICD-10-CM | POA: Diagnosis not present

## 2017-07-04 DIAGNOSIS — D649 Anemia, unspecified: Secondary | ICD-10-CM | POA: Diagnosis not present

## 2017-07-04 DIAGNOSIS — Z87440 Personal history of urinary (tract) infections: Secondary | ICD-10-CM | POA: Diagnosis not present

## 2017-07-04 DIAGNOSIS — E785 Hyperlipidemia, unspecified: Secondary | ICD-10-CM | POA: Diagnosis not present

## 2017-07-04 DIAGNOSIS — S52502D Unspecified fracture of the lower end of left radius, subsequent encounter for closed fracture with routine healing: Secondary | ICD-10-CM | POA: Diagnosis not present

## 2017-07-04 DIAGNOSIS — F0390 Unspecified dementia without behavioral disturbance: Secondary | ICD-10-CM | POA: Diagnosis not present

## 2017-07-04 DIAGNOSIS — S022XXD Fracture of nasal bones, subsequent encounter for fracture with routine healing: Secondary | ICD-10-CM | POA: Diagnosis not present

## 2017-07-04 DIAGNOSIS — F4310 Post-traumatic stress disorder, unspecified: Secondary | ICD-10-CM | POA: Diagnosis not present

## 2017-07-04 DIAGNOSIS — I10 Essential (primary) hypertension: Secondary | ICD-10-CM | POA: Diagnosis not present

## 2017-07-04 DIAGNOSIS — F411 Generalized anxiety disorder: Secondary | ICD-10-CM | POA: Diagnosis not present

## 2017-07-05 DIAGNOSIS — E785 Hyperlipidemia, unspecified: Secondary | ICD-10-CM | POA: Diagnosis not present

## 2017-07-05 DIAGNOSIS — F0390 Unspecified dementia without behavioral disturbance: Secondary | ICD-10-CM | POA: Diagnosis not present

## 2017-07-05 DIAGNOSIS — F4310 Post-traumatic stress disorder, unspecified: Secondary | ICD-10-CM | POA: Diagnosis not present

## 2017-07-05 DIAGNOSIS — F411 Generalized anxiety disorder: Secondary | ICD-10-CM | POA: Diagnosis not present

## 2017-07-05 DIAGNOSIS — S52502D Unspecified fracture of the lower end of left radius, subsequent encounter for closed fracture with routine healing: Secondary | ICD-10-CM | POA: Diagnosis not present

## 2017-07-05 DIAGNOSIS — S022XXD Fracture of nasal bones, subsequent encounter for fracture with routine healing: Secondary | ICD-10-CM | POA: Diagnosis not present

## 2017-07-05 DIAGNOSIS — Z87440 Personal history of urinary (tract) infections: Secondary | ICD-10-CM | POA: Diagnosis not present

## 2017-07-05 DIAGNOSIS — I10 Essential (primary) hypertension: Secondary | ICD-10-CM | POA: Diagnosis not present

## 2017-07-05 DIAGNOSIS — D649 Anemia, unspecified: Secondary | ICD-10-CM | POA: Diagnosis not present

## 2017-07-07 ENCOUNTER — Other Ambulatory Visit: Payer: Self-pay | Admitting: Family Medicine

## 2017-07-08 DIAGNOSIS — S52502D Unspecified fracture of the lower end of left radius, subsequent encounter for closed fracture with routine healing: Secondary | ICD-10-CM | POA: Diagnosis not present

## 2017-07-08 DIAGNOSIS — F411 Generalized anxiety disorder: Secondary | ICD-10-CM | POA: Diagnosis not present

## 2017-07-08 DIAGNOSIS — I10 Essential (primary) hypertension: Secondary | ICD-10-CM | POA: Diagnosis not present

## 2017-07-08 DIAGNOSIS — F0390 Unspecified dementia without behavioral disturbance: Secondary | ICD-10-CM | POA: Diagnosis not present

## 2017-07-08 DIAGNOSIS — F4310 Post-traumatic stress disorder, unspecified: Secondary | ICD-10-CM | POA: Diagnosis not present

## 2017-07-08 DIAGNOSIS — E785 Hyperlipidemia, unspecified: Secondary | ICD-10-CM | POA: Diagnosis not present

## 2017-07-08 DIAGNOSIS — S022XXD Fracture of nasal bones, subsequent encounter for fracture with routine healing: Secondary | ICD-10-CM | POA: Diagnosis not present

## 2017-07-08 DIAGNOSIS — D649 Anemia, unspecified: Secondary | ICD-10-CM | POA: Diagnosis not present

## 2017-07-08 DIAGNOSIS — Z87440 Personal history of urinary (tract) infections: Secondary | ICD-10-CM | POA: Diagnosis not present

## 2017-07-09 DIAGNOSIS — S022XXD Fracture of nasal bones, subsequent encounter for fracture with routine healing: Secondary | ICD-10-CM | POA: Diagnosis not present

## 2017-07-09 DIAGNOSIS — F0390 Unspecified dementia without behavioral disturbance: Secondary | ICD-10-CM | POA: Diagnosis not present

## 2017-07-09 DIAGNOSIS — D649 Anemia, unspecified: Secondary | ICD-10-CM | POA: Diagnosis not present

## 2017-07-09 DIAGNOSIS — F411 Generalized anxiety disorder: Secondary | ICD-10-CM | POA: Diagnosis not present

## 2017-07-09 DIAGNOSIS — F4310 Post-traumatic stress disorder, unspecified: Secondary | ICD-10-CM | POA: Diagnosis not present

## 2017-07-09 DIAGNOSIS — S52502D Unspecified fracture of the lower end of left radius, subsequent encounter for closed fracture with routine healing: Secondary | ICD-10-CM | POA: Diagnosis not present

## 2017-07-09 DIAGNOSIS — Z87440 Personal history of urinary (tract) infections: Secondary | ICD-10-CM | POA: Diagnosis not present

## 2017-07-09 DIAGNOSIS — I10 Essential (primary) hypertension: Secondary | ICD-10-CM | POA: Diagnosis not present

## 2017-07-09 DIAGNOSIS — E785 Hyperlipidemia, unspecified: Secondary | ICD-10-CM | POA: Diagnosis not present

## 2017-07-10 ENCOUNTER — Other Ambulatory Visit: Payer: Self-pay | Admitting: *Deleted

## 2017-07-10 DIAGNOSIS — S52502D Unspecified fracture of the lower end of left radius, subsequent encounter for closed fracture with routine healing: Secondary | ICD-10-CM | POA: Diagnosis not present

## 2017-07-10 DIAGNOSIS — F411 Generalized anxiety disorder: Secondary | ICD-10-CM | POA: Diagnosis not present

## 2017-07-10 DIAGNOSIS — D649 Anemia, unspecified: Secondary | ICD-10-CM | POA: Diagnosis not present

## 2017-07-10 DIAGNOSIS — Z87440 Personal history of urinary (tract) infections: Secondary | ICD-10-CM | POA: Diagnosis not present

## 2017-07-10 DIAGNOSIS — S022XXD Fracture of nasal bones, subsequent encounter for fracture with routine healing: Secondary | ICD-10-CM | POA: Diagnosis not present

## 2017-07-10 DIAGNOSIS — E785 Hyperlipidemia, unspecified: Secondary | ICD-10-CM | POA: Diagnosis not present

## 2017-07-10 DIAGNOSIS — I10 Essential (primary) hypertension: Secondary | ICD-10-CM | POA: Diagnosis not present

## 2017-07-10 DIAGNOSIS — F0390 Unspecified dementia without behavioral disturbance: Secondary | ICD-10-CM | POA: Diagnosis not present

## 2017-07-10 DIAGNOSIS — F4310 Post-traumatic stress disorder, unspecified: Secondary | ICD-10-CM | POA: Diagnosis not present

## 2017-07-10 MED ORDER — ATORVASTATIN CALCIUM 40 MG PO TABS: 40.0000 mg | ORAL_TABLET | Freq: Every day | ORAL | 3 refills | Status: AC

## 2017-07-11 ENCOUNTER — Telehealth: Payer: Self-pay | Admitting: *Deleted

## 2017-07-11 DIAGNOSIS — D649 Anemia, unspecified: Secondary | ICD-10-CM | POA: Diagnosis not present

## 2017-07-11 DIAGNOSIS — F411 Generalized anxiety disorder: Secondary | ICD-10-CM | POA: Diagnosis not present

## 2017-07-11 DIAGNOSIS — E785 Hyperlipidemia, unspecified: Secondary | ICD-10-CM | POA: Diagnosis not present

## 2017-07-11 DIAGNOSIS — F0390 Unspecified dementia without behavioral disturbance: Secondary | ICD-10-CM | POA: Diagnosis not present

## 2017-07-11 DIAGNOSIS — F4310 Post-traumatic stress disorder, unspecified: Secondary | ICD-10-CM | POA: Diagnosis not present

## 2017-07-11 DIAGNOSIS — S52502D Unspecified fracture of the lower end of left radius, subsequent encounter for closed fracture with routine healing: Secondary | ICD-10-CM | POA: Diagnosis not present

## 2017-07-11 DIAGNOSIS — S022XXD Fracture of nasal bones, subsequent encounter for fracture with routine healing: Secondary | ICD-10-CM | POA: Diagnosis not present

## 2017-07-11 DIAGNOSIS — Z87440 Personal history of urinary (tract) infections: Secondary | ICD-10-CM | POA: Diagnosis not present

## 2017-07-11 DIAGNOSIS — I10 Essential (primary) hypertension: Secondary | ICD-10-CM | POA: Diagnosis not present

## 2017-07-11 NOTE — Telephone Encounter (Signed)
Agree with plan for home health orders.  Do I need to put these in somewhere?

## 2017-07-11 NOTE — Telephone Encounter (Signed)
[facility] calling to get verbal orders for pt, 2 times a week for 3 weeks to improve static and dynamic balance. Please advise. Jery Hollern Bruna PotterBlount, CMA

## 2017-07-13 ENCOUNTER — Emergency Department (HOSPITAL_COMMUNITY): Payer: Medicare HMO

## 2017-07-13 ENCOUNTER — Inpatient Hospital Stay (HOSPITAL_COMMUNITY)
Admission: EM | Admit: 2017-07-13 | Discharge: 2017-07-17 | DRG: 083 | Disposition: A | Discharge status: Home Health | Payer: Medicare HMO | Attending: Family Medicine | Admitting: Family Medicine

## 2017-07-13 ENCOUNTER — Encounter (HOSPITAL_COMMUNITY): Payer: Self-pay | Admitting: Emergency Medicine

## 2017-07-13 DIAGNOSIS — S065X9A Traumatic subdural hemorrhage with loss of consciousness of unspecified duration, initial encounter: Secondary | ICD-10-CM | POA: Diagnosis not present

## 2017-07-13 DIAGNOSIS — Z681 Body mass index (BMI) 19 or less, adult: Secondary | ICD-10-CM

## 2017-07-13 DIAGNOSIS — Z7189 Other specified counseling: Secondary | ICD-10-CM

## 2017-07-13 DIAGNOSIS — R55 Syncope and collapse: Secondary | ICD-10-CM | POA: Diagnosis not present

## 2017-07-13 DIAGNOSIS — I639 Cerebral infarction, unspecified: Secondary | ICD-10-CM | POA: Diagnosis not present

## 2017-07-13 DIAGNOSIS — Y92009 Unspecified place in unspecified non-institutional (private) residence as the place of occurrence of the external cause: Secondary | ICD-10-CM | POA: Diagnosis not present

## 2017-07-13 DIAGNOSIS — Z515 Encounter for palliative care: Secondary | ICD-10-CM

## 2017-07-13 DIAGNOSIS — W19XXXA Unspecified fall, initial encounter: Secondary | ICD-10-CM

## 2017-07-13 DIAGNOSIS — S299XXA Unspecified injury of thorax, initial encounter: Secondary | ICD-10-CM | POA: Diagnosis not present

## 2017-07-13 DIAGNOSIS — M546 Pain in thoracic spine: Secondary | ICD-10-CM | POA: Diagnosis not present

## 2017-07-13 DIAGNOSIS — S02401A Maxillary fracture, unspecified, initial encounter for closed fracture: Secondary | ICD-10-CM | POA: Diagnosis not present

## 2017-07-13 DIAGNOSIS — Z8673 Personal history of transient ischemic attack (TIA), and cerebral infarction without residual deficits: Secondary | ICD-10-CM

## 2017-07-13 DIAGNOSIS — E119 Type 2 diabetes mellitus without complications: Secondary | ICD-10-CM | POA: Diagnosis present

## 2017-07-13 DIAGNOSIS — S0231XA Fracture of orbital floor, right side, initial encounter for closed fracture: Secondary | ICD-10-CM | POA: Diagnosis not present

## 2017-07-13 DIAGNOSIS — S0281XA Fracture of other specified skull and facial bones, right side, initial encounter for closed fracture: Secondary | ICD-10-CM | POA: Diagnosis not present

## 2017-07-13 DIAGNOSIS — I6200 Nontraumatic subdural hemorrhage, unspecified: Secondary | ICD-10-CM | POA: Diagnosis not present

## 2017-07-13 DIAGNOSIS — Z66 Do not resuscitate: Secondary | ICD-10-CM | POA: Diagnosis present

## 2017-07-13 DIAGNOSIS — S0240CA Maxillary fracture, right side, initial encounter for closed fracture: Secondary | ICD-10-CM | POA: Diagnosis not present

## 2017-07-13 DIAGNOSIS — S0280XD Fracture of other specified skull and facial bones, unspecified side, subsequent encounter for fracture with routine healing: Secondary | ICD-10-CM | POA: Diagnosis not present

## 2017-07-13 DIAGNOSIS — Z880 Allergy status to penicillin: Secondary | ICD-10-CM

## 2017-07-13 DIAGNOSIS — Z87891 Personal history of nicotine dependence: Secondary | ICD-10-CM

## 2017-07-13 DIAGNOSIS — I700 Atherosclerosis of aorta: Secondary | ICD-10-CM | POA: Diagnosis not present

## 2017-07-13 DIAGNOSIS — E440 Moderate protein-calorie malnutrition: Secondary | ICD-10-CM | POA: Diagnosis not present

## 2017-07-13 DIAGNOSIS — Z79899 Other long term (current) drug therapy: Secondary | ICD-10-CM

## 2017-07-13 DIAGNOSIS — F4310 Post-traumatic stress disorder, unspecified: Secondary | ICD-10-CM | POA: Diagnosis present

## 2017-07-13 DIAGNOSIS — S065XAA Traumatic subdural hemorrhage with loss of consciousness status unknown, initial encounter: Secondary | ICD-10-CM | POA: Diagnosis present

## 2017-07-13 DIAGNOSIS — R64 Cachexia: Secondary | ICD-10-CM

## 2017-07-13 DIAGNOSIS — E785 Hyperlipidemia, unspecified: Secondary | ICD-10-CM | POA: Diagnosis present

## 2017-07-13 DIAGNOSIS — R2681 Unsteadiness on feet: Secondary | ICD-10-CM | POA: Diagnosis not present

## 2017-07-13 DIAGNOSIS — S0285XA Fracture of orbit, unspecified, initial encounter for closed fracture: Secondary | ICD-10-CM

## 2017-07-13 DIAGNOSIS — Z7982 Long term (current) use of aspirin: Secondary | ICD-10-CM

## 2017-07-13 DIAGNOSIS — S065X0A Traumatic subdural hemorrhage without loss of consciousness, initial encounter: Secondary | ICD-10-CM | POA: Diagnosis not present

## 2017-07-13 DIAGNOSIS — F0150 Vascular dementia without behavioral disturbance: Secondary | ICD-10-CM | POA: Diagnosis present

## 2017-07-13 DIAGNOSIS — I10 Essential (primary) hypertension: Secondary | ICD-10-CM | POA: Diagnosis not present

## 2017-07-13 DIAGNOSIS — R54 Age-related physical debility: Secondary | ICD-10-CM | POA: Diagnosis not present

## 2017-07-13 DIAGNOSIS — W19XXXD Unspecified fall, subsequent encounter: Secondary | ICD-10-CM | POA: Diagnosis not present

## 2017-07-13 DIAGNOSIS — S0280XA Fracture of other specified skull and facial bones, unspecified side, initial encounter for closed fracture: Secondary | ICD-10-CM | POA: Diagnosis not present

## 2017-07-13 DIAGNOSIS — S3992XA Unspecified injury of lower back, initial encounter: Secondary | ICD-10-CM | POA: Diagnosis not present

## 2017-07-13 HISTORY — DX: Age-related physical debility: R54

## 2017-07-13 HISTORY — DX: Atherosclerosis of aorta: I70.0

## 2017-07-13 LAB — BASIC METABOLIC PANEL WITH GFR
Anion gap: 10 (ref 5–15)
BUN: 21 mg/dL — ABNORMAL HIGH (ref 6–20)
CO2: 22 mmol/L (ref 22–32)
Calcium: 9.4 mg/dL (ref 8.9–10.3)
Chloride: 106 mmol/L (ref 101–111)
Creatinine, Ser: 0.68 mg/dL (ref 0.44–1.00)
GFR calc Af Amer: >60 mL/min
GFR calc non Af Amer: >60 mL/min
Glucose, Bld: 229 mg/dL — ABNORMAL HIGH (ref 65–99)
Potassium: 4 mmol/L (ref 3.5–5.1)
Sodium: 138 mmol/L (ref 135–145)

## 2017-07-13 LAB — CBC
HCT: 39.1 % (ref 36.0–46.0)
Hemoglobin: 11.9 g/dL — ABNORMAL LOW (ref 12.0–15.0)
MCH: 22.6 pg — ABNORMAL LOW (ref 26.0–34.0)
MCHC: 30.4 g/dL (ref 30.0–36.0)
MCV: 74.3 fL — ABNORMAL LOW (ref 78.0–100.0)
Platelets: 304 K/uL (ref 150–400)
RBC: 5.26 MIL/uL — ABNORMAL HIGH (ref 3.87–5.11)
RDW: 17.1 % — ABNORMAL HIGH (ref 11.5–15.5)
WBC: 6.8 K/uL (ref 4.0–10.5)

## 2017-07-13 LAB — URINALYSIS, ROUTINE W REFLEX MICROSCOPIC
Bacteria, UA: NONE SEEN
Bilirubin Urine: NEGATIVE
GLUCOSE, UA: NEGATIVE mg/dL
HGB URINE DIPSTICK: NEGATIVE
KETONES UR: NEGATIVE mg/dL
NITRITE: NEGATIVE
PROTEIN: NEGATIVE mg/dL
Specific Gravity, Urine: 1.018 (ref 1.005–1.030)
pH: 6 (ref 5.0–8.0)

## 2017-07-13 LAB — PROTIME-INR
INR: 1.03
Prothrombin Time: 13.5 seconds (ref 11.4–15.2)

## 2017-07-13 MED ORDER — SODIUM CHLORIDE 0.9 % IV SOLN
INTRAVENOUS | Status: DC
  Administered 2017-07-13: 1000 mL via INTRAVENOUS

## 2017-07-13 MED ORDER — BOOST PLUS PO LIQD
237.0000 mL | Freq: Every day | ORAL | Status: DC
  Administered 2017-07-14 – 2017-07-15 (×2): 237 mL via ORAL
  Filled 2017-07-13 (×3): qty 237

## 2017-07-13 MED ORDER — TRAMADOL HCL 50 MG PO TABS: 50.0000 mg | ORAL_TABLET | Freq: Four times a day (QID) | ORAL | Status: DC | PRN

## 2017-07-13 MED ORDER — MIRTAZAPINE 15 MG PO TABS
15.0000 mg | ORAL_TABLET | Freq: Every day | ORAL | Status: DC
  Administered 2017-07-13 – 2017-07-15 (×3): 15 mg via ORAL
  Filled 2017-07-13 (×4): qty 1

## 2017-07-13 MED ORDER — ACETAMINOPHEN 500 MG PO TABS
1000.0000 mg | ORAL_TABLET | Freq: Once | ORAL | Status: AC
  Administered 2017-07-13: 1000 mg via ORAL
  Filled 2017-07-13: qty 2

## 2017-07-13 MED ORDER — POLYETHYLENE GLYCOL 3350 17 G PO PACK: 17.0000 g | PACK | Freq: Every day | ORAL | Status: DC | PRN

## 2017-07-13 MED ORDER — LEVOFLOXACIN 500 MG PO TABS
500.0000 mg | ORAL_TABLET | ORAL | Status: DC
  Administered 2017-07-13 – 2017-07-16 (×4): 500 mg via ORAL
  Filled 2017-07-13 (×4): qty 1

## 2017-07-13 MED ORDER — MORPHINE SULFATE (PF) 4 MG/ML IV SOLN: 2.0000 mg | INTRAVENOUS | Status: DC | PRN

## 2017-07-13 MED ORDER — ACETAMINOPHEN 650 MG RE SUPP: 650.0000 mg | Freq: Four times a day (QID) | RECTAL | Status: DC | PRN

## 2017-07-13 MED ORDER — ATORVASTATIN CALCIUM 40 MG PO TABS
40.0000 mg | ORAL_TABLET | Freq: Every day | ORAL | Status: DC
  Administered 2017-07-14 – 2017-07-17 (×4): 40 mg via ORAL
  Filled 2017-07-13 (×4): qty 1

## 2017-07-13 MED ORDER — ACETAMINOPHEN 325 MG PO TABS
650.0000 mg | ORAL_TABLET | Freq: Four times a day (QID) | ORAL | Status: DC | PRN
  Administered 2017-07-15: 325 mg via ORAL
  Administered 2017-07-15 – 2017-07-17 (×2): 650 mg via ORAL
  Filled 2017-07-13 (×3): qty 2

## 2017-07-13 NOTE — Progress Notes (Signed)
Patient's daughter called. Asked if I would call her. I asked the patient if she would like to talk to her or if she would like me to talk to her. The patient did not want me to talk to the daughter and she told me "I will talk to therm in the morning". Moments later the son called and wanted to know if patient were being discharged tomorrow. I gave him the same information as I did with his sister. He asked me if  I could tell him what the discharge process was in general. I explained, in general, that doctors round on patients in the morning and make decisions then.He said thank you.

## 2017-07-13 NOTE — Progress Notes (Signed)
Patient connected to tele box 18 with continuous pulse./ Spoke to SchulenburgNatalia.

## 2017-07-13 NOTE — ED Notes (Signed)
Pt back from radiology 

## 2017-07-13 NOTE — ED Notes (Signed)
Son left phone number to be reached.  [phone]
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
## 2017-07-13 NOTE — Progress Notes (Signed)
Patient arrived to the room alert and oriented. Called admissions per order to page MD when patient arrives.  Patient is lying on R side, visibly in pain, stating it is a 5/10. She has had tylenol, and does not wish to have anything else at this time. Will continue to monitor. Bed alarm is on.  Family medicine paged and returned call, aware patient is here.

## 2017-07-13 NOTE — Progress Notes (Signed)
Received report on patient coming to room 17, being admitted with facial fractures and SDH on telemetry. [facility], who reports patient is A &O x4 on arrival. She reported that she ate a good dinner, with help, d/t R eye being swollen, affecting vision. Awaiting for patient.

## 2017-07-13 NOTE — ED Notes (Signed)
Pt requesting to not have an IV at this time.

## 2017-07-13 NOTE — ED Notes (Signed)
Please call son with update when pt is roomed and has been seen by provider Darletta Mollian Calabrese: [phone]
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
## 2017-07-13 NOTE — ED Notes (Signed)
Report attempted 

## 2017-07-13 NOTE — ED Notes (Signed)
Patient transported to X-ray 

## 2017-07-13 NOTE — ED Triage Notes (Signed)
Son stated, she fell this morning in the bathroom, and he called her and she had fallen and not eaten.  She takes Aspirin. She got dizzy and fell. shes not eating good.

## 2017-07-14 ENCOUNTER — Observation Stay (HOSPITAL_COMMUNITY): Payer: Medicare HMO

## 2017-07-14 DIAGNOSIS — W19XXXA Unspecified fall, initial encounter: Secondary | ICD-10-CM | POA: Diagnosis present

## 2017-07-14 DIAGNOSIS — E119 Type 2 diabetes mellitus without complications: Secondary | ICD-10-CM | POA: Diagnosis present

## 2017-07-14 DIAGNOSIS — Z87891 Personal history of nicotine dependence: Secondary | ICD-10-CM | POA: Diagnosis not present

## 2017-07-14 DIAGNOSIS — I6200 Nontraumatic subdural hemorrhage, unspecified: Secondary | ICD-10-CM | POA: Diagnosis present

## 2017-07-14 DIAGNOSIS — R64 Cachexia: Secondary | ICD-10-CM | POA: Diagnosis not present

## 2017-07-14 DIAGNOSIS — E440 Moderate protein-calorie malnutrition: Secondary | ICD-10-CM | POA: Diagnosis present

## 2017-07-14 DIAGNOSIS — R55 Syncope and collapse: Secondary | ICD-10-CM

## 2017-07-14 DIAGNOSIS — Z8673 Personal history of transient ischemic attack (TIA), and cerebral infarction without residual deficits: Secondary | ICD-10-CM | POA: Diagnosis not present

## 2017-07-14 DIAGNOSIS — Z681 Body mass index (BMI) 19 or less, adult: Secondary | ICD-10-CM | POA: Diagnosis not present

## 2017-07-14 DIAGNOSIS — Z79899 Other long term (current) drug therapy: Secondary | ICD-10-CM | POA: Diagnosis not present

## 2017-07-14 DIAGNOSIS — Y92009 Unspecified place in unspecified non-institutional (private) residence as the place of occurrence of the external cause: Secondary | ICD-10-CM

## 2017-07-14 DIAGNOSIS — S065X9A Traumatic subdural hemorrhage with loss of consciousness of unspecified duration, initial encounter: Secondary | ICD-10-CM | POA: Diagnosis present

## 2017-07-14 DIAGNOSIS — Z880 Allergy status to penicillin: Secondary | ICD-10-CM | POA: Diagnosis not present

## 2017-07-14 DIAGNOSIS — S065X0A Traumatic subdural hemorrhage without loss of consciousness, initial encounter: Secondary | ICD-10-CM | POA: Diagnosis not present

## 2017-07-14 DIAGNOSIS — I700 Atherosclerosis of aorta: Secondary | ICD-10-CM | POA: Diagnosis not present

## 2017-07-14 DIAGNOSIS — Z7189 Other specified counseling: Secondary | ICD-10-CM | POA: Diagnosis not present

## 2017-07-14 DIAGNOSIS — F0150 Vascular dementia without behavioral disturbance: Secondary | ICD-10-CM | POA: Diagnosis present

## 2017-07-14 DIAGNOSIS — S0285XA Fracture of orbit, unspecified, initial encounter for closed fracture: Secondary | ICD-10-CM | POA: Diagnosis present

## 2017-07-14 DIAGNOSIS — W19XXXD Unspecified fall, subsequent encounter: Secondary | ICD-10-CM | POA: Diagnosis not present

## 2017-07-14 DIAGNOSIS — Z66 Do not resuscitate: Secondary | ICD-10-CM | POA: Diagnosis present

## 2017-07-14 DIAGNOSIS — F4310 Post-traumatic stress disorder, unspecified: Secondary | ICD-10-CM | POA: Diagnosis present

## 2017-07-14 DIAGNOSIS — R54 Age-related physical debility: Secondary | ICD-10-CM | POA: Diagnosis not present

## 2017-07-14 DIAGNOSIS — E785 Hyperlipidemia, unspecified: Secondary | ICD-10-CM | POA: Diagnosis present

## 2017-07-14 DIAGNOSIS — S0240CA Maxillary fracture, right side, initial encounter for closed fracture: Secondary | ICD-10-CM | POA: Diagnosis present

## 2017-07-14 DIAGNOSIS — S0281XA Fracture of other specified skull and facial bones, right side, initial encounter for closed fracture: Secondary | ICD-10-CM | POA: Diagnosis present

## 2017-07-14 DIAGNOSIS — Z7982 Long term (current) use of aspirin: Secondary | ICD-10-CM | POA: Diagnosis not present

## 2017-07-14 DIAGNOSIS — Z515 Encounter for palliative care: Secondary | ICD-10-CM | POA: Diagnosis present

## 2017-07-14 DIAGNOSIS — S02401A Maxillary fracture, unspecified, initial encounter for closed fracture: Secondary | ICD-10-CM | POA: Diagnosis present

## 2017-07-14 DIAGNOSIS — I10 Essential (primary) hypertension: Secondary | ICD-10-CM | POA: Diagnosis present

## 2017-07-14 DIAGNOSIS — S0231XA Fracture of orbital floor, right side, initial encounter for closed fracture: Secondary | ICD-10-CM | POA: Diagnosis present

## 2017-07-14 DIAGNOSIS — S0280XD Fracture of other specified skull and facial bones, unspecified side, subsequent encounter for fracture with routine healing: Secondary | ICD-10-CM | POA: Diagnosis not present

## 2017-07-14 DIAGNOSIS — S0280XA Fracture of other specified skull and facial bones, unspecified side, initial encounter for closed fracture: Secondary | ICD-10-CM | POA: Diagnosis not present

## 2017-07-14 LAB — CBC
HEMATOCRIT: 33.6 % — AB (ref 36.0–46.0)
Hemoglobin: 10.3 g/dL — ABNORMAL LOW (ref 12.0–15.0)
MCH: 22.7 pg — AB (ref 26.0–34.0)
MCHC: 30.7 g/dL (ref 30.0–36.0)
MCV: 74 fL — AB (ref 78.0–100.0)
PLATELETS: 259 10*3/uL (ref 150–400)
RBC: 4.54 MIL/uL (ref 3.87–5.11)
RDW: 17 % — AB (ref 11.5–15.5)
WBC: 5.9 10*3/uL (ref 4.0–10.5)
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
## 2017-07-14 NOTE — Evaluation (Signed)
Physical Therapy Evaluation Patient Details Name: Olivia Robertson MRN: [id number] DOB: 23-Nov-1936 Today's Date: 07/14/2017   History of Present Illness  81 y.o. female presenting with eye pain and headache after she fell and hit her head 7/28 at 1:00AM. PMH is significant for HLD, anxiety, hx of TIA, and low appetite. Imaging revealed Subdural hematoma 3cm.   Clinical Impression  Orders received for PT evaluation. Patient demonstrates deficits in functional mobility as indicated below. Will benefit from continued skilled PT to address deficits and maximize function. Will see as indicated and progress as tolerated.  Given history of multiple falls at home, decreased ability to ambulate without assist and increased anxiety feel patient would benefit from ST SNF upon acute discharge. Patient did express concerns regarding previous ST SNF stay stating they "made her wait over an hour to go to the bathroom because they didn't have enough help" spoke with patient regarding her concerns for her safety. Discussed thoughts for assisted living resources and potential ST SNF at different venue. Patient would like to explore these opportunities further. Recommend LCSW consult. Will follow acutely.    Follow Up Recommendations SNF;Supervision/Assistance - 24 hour (if declining SNF will need HHPT, Aide and 24/7 supervision)    Equipment Recommendations  None recommended by PT    Recommendations for Other Services       Precautions / Restrictions Precautions Precautions: Fall Restrictions Weight Bearing Restrictions: No      Mobility  Bed Mobility Overal bed mobility: Needs Assistance Bed Mobility: Supine to Sit     Supine to sit: Mod assist;HOB elevated     General bed mobility comments: received in chair from OT therapist  Transfers Overall transfer level: Needs assistance Equipment used: 1 person hand held assist Transfers: Sit to/from Stand Sit to Stand: Min assist          General transfer comment: Min assist for stability, increaed anxiety when powering up to standing  Ambulation/Gait Ambulation/Gait assistance: Min assist Ambulation Distance (Feet): 140 Feet Assistive device: 1 person hand held assist Gait Pattern/deviations: Step-through pattern;Decreased stride length;Shuffle;Drifts right/left;Trunk flexed;Narrow base of support Gait velocity: decreased Gait velocity interpretation: Below normal speed for age/gender General Gait Details: patient very unsteady with gait, required HHA at all times and cues for safety with visual impairments  Stairs            Wheelchair Mobility    Modified Rankin (Stroke Patients Only)       Balance Overall balance assessment: History of Falls;Needs assistance Sitting-balance support: Feet supported;Bilateral upper extremity supported Sitting balance-Leahy Scale: Fair     Standing balance support: Single extremity supported;During functional activity Standing balance-Leahy Scale: Poor Standing balance comment: reliant on UE support and, anxious                             Pertinent Vitals/Pain Pain Assessment: Faces Faces Pain Scale: Hurts little more Pain Location: R eye, face and roof of mouth Pain Descriptors / Indicators: Discomfort;Sharp;Tender Pain Intervention(s): Monitored during session;Repositioned    Home Living Family/patient expects to be discharged to:: Private residence Living Arrangements: Alone (has indoor cat) Available Help at Discharge: Family;Available PRN/intermittently (work full time during the day) Type of Home: Apartment Home Access: Level entry     Home Layout: One level Home Equipment: Walker - 2 wheels;Shower seat;Cane - single point Additional Comments: does not drive but doing IADL, ADL    Prior Function Level of Independence: Independent with assistive  device(s)         Comments: recently got a cane     Hand Dominance   Dominant Hand:  Right    Extremity/Trunk Assessment   Upper Extremity Assessment Upper Extremity Assessment: Generalized weakness    Lower Extremity Assessment Lower Extremity Assessment: Generalized weakness    Cervical / Trunk Assessment Cervical / Trunk Assessment: Kyphotic  Communication   Communication: Expressive difficulties  Cognition Arousal/Alertness: Awake/alert Behavior During Therapy: Anxious Overall Cognitive Status: Impaired/Different from baseline Area of Impairment: Safety/judgement                         Safety/Judgement: Decreased awareness of safety;Decreased awareness of deficits     General Comments: "I am scared to go home" but refusing SNF      General Comments General comments (skin integrity, edema, etc.): Pt had a really bad [facility] at [facility]. This therapist believes that the safest thing for the patient would be to consider a higher level of care/assist on a more permenant basis.    Exercises     Assessment/Plan    PT Assessment Patient needs continued PT services  PT Problem List Decreased strength;Decreased activity tolerance;Decreased balance;Decreased mobility;Decreased knowledge of precautions;Pain       PT Treatment Interventions DME instruction;Gait training;Functional mobility training;Therapeutic activities;Therapeutic exercise;Balance training;Patient/family education    PT Goals (Current goals can be found in the Care Plan section)  Acute Rehab PT Goals Patient Stated Goal: to go home PT Goal Formulation: With patient Time For Goal Achievement: 07/28/17 Potential to Achieve Goals: Fair    Frequency Min 3X/week   Barriers to discharge Decreased caregiver support      Co-evaluation               AM-PAC PT "6 Clicks" Daily Activity  Outcome Measure Difficulty turning over in bed (including adjusting bedclothes, sheets and  blankets)?: A Lot Difficulty moving from lying on back to sitting on the side of the bed? : A Lot Difficulty sitting down on and standing up from a chair with arms (e.g., wheelchair, bedside commode, etc,.)?: Total Help needed moving to and from a bed to chair (including a wheelchair)?: A Lot Help needed walking in hospital room?: A Lot Help needed climbing 3-5 steps with a railing? : A Little 6 Click Score: 12    End of Session Equipment Utilized During Treatment: Gait belt Activity Tolerance: Patient limited by fatigue Patient left: in chair;with call bell/phone within reach;with chair alarm set Nurse Communication: Mobility status PT Visit Diagnosis: Unsteadiness on feet (R26.81);Muscle weakness (generalized) (M62.81);History of falling (Z91.81)    Time: 1540-1601 PT Time Calculation (min) (ACUTE ONLY): 21 min   Charges:   PT Evaluation $PT Eval Moderate Complexity: 1 Procedure     PT G Codes:   PT G-Codes **NOT FOR INPATIENT CLASS** Functional Assessment Tool Used: Clinical judgement Functional Limitation: Mobility: Walking and moving around Mobility: Walking and Moving Around Current Status ([id number](G8978): At least 20 percent but less than 40 percent impaired, limited or restricted Mobility: Walking and Moving Around Goal Status [id number](G8979): At least 1 percent but less than 20 percent impaired, limited or restricted    Olivia Robertson, PT DPT  Board Certified Neurologic Specialist [phone]   Olivia Robertson 07/14/2017, 4:13 PM

## 2017-07-14 NOTE — Consult Note (Signed)
Reason for Consult: Questionable syncope/fall Referring Physician: Family medicine  Olivia Robertson is an 81 y.o. female.  HPI: Patient is 81 year old female with past medical history significant for remote CVA, hypertension, diabetes mellitus, history of posttraumatic stress disorder, dementia, history of fall questionable syncope in the past, was admitted yesterday following a fall while going to the bathroom early in the morning and had questionable syncopal episode. Patient denies any chest pain shortness of breath or palpitations prior to the fall. Denies any weakness in the arms or legs. Denies any seizure activity. Patient states she banged her hand and face and was noted to have large amount of bruising around her by her son and so decided to come to the ED. EKG done in the ED showed normal sinus rhythm with no acute ischemic changes. Since admission monitor has showed no evidence of tachybradycardia arrhythmias or heart blocks. Patient had 2-D echo done recently which showed normal LV systolic function with no wall motion abnormalities and grade 1 diastolic dysfunction.  Past Medical History:  Diagnosis Date  . Anxiety   . Distal radius fracture, left   . History of posttraumatic stress disorder (PTSD)   . History of prediabetes   . HTN (hypertension)   . Hyperlipidemia   . Impaired memory    Likely dementia  . Laceration of skin of forehead   . Nasal fracture   . Stroke ([place])   . UTI (urinary tract infection)     Past Surgical History:  Procedure Laterality Date  . BREAST BIOPSY Bilateral   . HYSTEROTOMY      Family History  Problem Relation Age of Onset  . Adopted: Yes  . Alcohol abuse Mother     Social History:  reports that she has quit smoking. She has quit using smokeless tobacco. She reports that she does not drink alcohol or use drugs.  Allergies:  Allergies  Allergen Reactions  . Penicillins Hives    Has patient had a PCN reaction causing immediate rash,  facial/tongue/throat swelling, SOB or lightheadedness with hypotension: No Has patient had a PCN reaction causing severe rash involving mucus membranes or skin necrosis: No Has patient had a PCN reaction that required hospitalization: No Has patient had a PCN reaction occurring within the last 10 years: No If all of the above answers are "NO", then may proceed with Cephalosporin use.  Marland Kitchen Penicillins Hives    Medications: I have reviewed the patient's current medications.  Results for orders placed or performed during the hospital encounter of 07/13/17 (from the past 48 hour(s))  Basic metabolic panel     Status: Abnormal   Collection Time: 07/13/17  1:53 PM  Result Value Ref Range   Sodium 138 135 - 145 mmol/L   Potassium 4.0 3.5 - 5.1 mmol/L   Chloride 106 101 - 111 mmol/L   CO2 22 22 - 32 mmol/L   Glucose, Bld 229 (H) 65 - 99 mg/dL   BUN 21 (H) 6 - 20 mg/dL   Creatinine, Ser 0.68 0.44 - 1.00 mg/dL   Calcium 9.4 8.9 - 10.3 mg/dL   GFR calc non Af Amer >60 >60 mL/min   GFR calc Af Amer >60 >60 mL/min    Comment: (NOTE) The eGFR has been calculated using the CKD EPI equation. This calculation has not been validated in all clinical situations. eGFR's persistently <60 mL/min signify possible Chronic Kidney Disease.    Anion gap 10 5 - 15  CBC     Status: Abnormal  Collection Time: 07/13/17  1:53 PM  Result Value Ref Range   WBC 6.8 4.0 - 10.5 K/uL   RBC 5.26 (H) 3.87 - 5.11 MIL/uL   Hemoglobin 11.9 (L) 12.0 - 15.0 g/dL   HCT 39.1 36.0 - 46.0 %   MCV 74.3 (L) 78.0 - 100.0 fL   MCH 22.6 (L) 26.0 - 34.0 pg   MCHC 30.4 30.0 - 36.0 g/dL   RDW 17.1 (H) 11.5 - 15.5 %   Platelets 304 150 - 400 K/uL  Protime-INR     Status: None   Collection Time: 07/13/17  1:53 PM  Result Value Ref Range   Prothrombin Time 13.5 11.4 - 15.2 seconds   INR 1.03   Urinalysis, Routine w reflex microscopic     Status: Abnormal   Collection Time: 07/13/17  6:39 PM  Result Value Ref Range   Color,  Urine YELLOW YELLOW   APPearance CLEAR CLEAR   Specific Gravity, Urine 1.018 1.005 - 1.030   pH 6.0 5.0 - 8.0   Glucose, UA NEGATIVE NEGATIVE mg/dL   Hgb urine dipstick NEGATIVE NEGATIVE   Bilirubin Urine NEGATIVE NEGATIVE   Ketones, ur NEGATIVE NEGATIVE mg/dL   Protein, ur NEGATIVE NEGATIVE mg/dL   Nitrite NEGATIVE NEGATIVE   Leukocytes, UA MODERATE (A) NEGATIVE   RBC / HPF 0-5 0 - 5 RBC/hpf   WBC, UA 0-5 0 - 5 WBC/hpf   Bacteria, UA NONE SEEN NONE SEEN   Squamous Epithelial / LPF 0-5 (A) NONE SEEN   Mucous PRESENT   CBC     Status: Abnormal   Collection Time: 07/14/17  3:29 AM  Result Value Ref Range   WBC 5.9 4.0 - 10.5 K/uL   RBC 4.54 3.87 - 5.11 MIL/uL   Hemoglobin 10.3 (L) 12.0 - 15.0 g/dL   HCT 33.6 (L) 36.0 - 46.0 %   MCV 74.0 (L) 78.0 - 100.0 fL   MCH 22.7 (L) 26.0 - 34.0 pg   MCHC 30.7 30.0 - 36.0 g/dL   RDW 17.0 (H) 11.5 - 15.5 %   Platelets 259 150 - 400 K/uL    Dg Chest 2 View  Result Date: 07/13/2017 CLINICAL DATA:  Fall.  Evaluate for injury. EXAM: CHEST  2 VIEW COMPARISON:  07/13/2017 FINDINGS: Normal heart size. No pleural effusion or edema. Aortic atherosclerosis. Mitral valve calcifications identified. No pleural effusion or edema. No airspace opacities. IMPRESSION: 1. No acute cardiopulmonary abnormalities. 2.  Aortic Atherosclerosis (ICD10-I70.0). Electronically Signed   By: Kerby Moors M.D.   On: 07/13/2017 17:39   Dg Thoracic Spine 2 View  Result Date: 07/13/2017 CLINICAL DATA:  Fall with generalized back pain. EXAM: THORACIC SPINE 2 VIEWS COMPARISON:  None. FINDINGS: No acute fracture or static subluxation of the thoracic spine. Multilevel lower thoracic degenerative vertebral body height loss and osteophyte formation. Extensive vascular calcification. IMPRESSION: No acute fracture or listhesis of the thoracic spine. Electronically Signed   By: Ulyses Jarred M.D.   On: 07/13/2017 17:41   Dg Lumbar Spine Complete  Result Date: 07/13/2017 CLINICAL  DATA:  FALL EXAM: LUMBAR SPINE - COMPLETE 4+ VIEW COMPARISON:  None. FINDINGS: No acute fracture or listhesis. There is multilevel lumbar facet arthrosis. Mild vertebral body height loss at multiple levels with associated osteophyte formation but no evidence of acute compression fracture. extensive vascular calcification. IMPRESSION: No acute fracture or listhesis of the lumbar spine. Multilevel degenerative disc disease and facet arthrosis. Electronically Signed   By: Cletus Gash.D.  On: 07/13/2017 17:40   Ct Head Wo Contrast  Result Date: 07/14/2017 CLINICAL DATA:  Subdural hematoma status post fall. EXAM: CT HEAD WITHOUT CONTRAST TECHNIQUE: Contiguous axial images were obtained from the base of the skull through the vertex without intravenous contrast. COMPARISON:  07/13/2017 FINDINGS: Brain: Minimal hyperdensity along the posterior falx has not significantly changed and is consistent with a tiny subdural hematoma measuring up to 3 mm in thickness. There is no evidence of new intracranial hemorrhage, acute infarct, mass, or midline shift. Mild generalized cerebral atrophy and mild chronic small vessel white matter disease are unchanged. Vascular: Calcified atherosclerosis at the skullbase. No hyperdense vessel. Skull: No fracture or osseous lesion. Sinuses/Orbits: Partially visualized right maxillary sinus fluid level and right lateral sinus wall fracture, more fully evaluated on yesterday's maxillofacial CT. Moderate left mastoid effusion. Cerumen in both external auditory canals. Bilateral cataract extraction. Other: None. IMPRESSION: 1. Unchanged tiny parafalcine subdural hematoma. 2. No new intracranial abnormality. Electronically Signed   By: Logan Bores M.D.   On: 07/14/2017 07:30   Ct Head Wo Contrast  Result Date: 07/13/2017 CLINICAL DATA:  Status post fall.  Bruising around right orbit. EXAM: CT HEAD WITHOUT CONTRAST CT MAXILLOFACIAL WITHOUT CONTRAST TECHNIQUE: Multidetector CT imaging of  the head and maxillofacial structures were performed using the standard protocol without intravenous contrast. Multiplanar CT image reconstructions of the maxillofacial structures were also generated. COMPARISON:  None. FINDINGS: CT HEAD FINDINGS Brain: There left-sided posterior parafalcine hyperdensity suspicious for a small subdural hematoma. This measures approximately 3 mm in maximum thickness. No significant mass effect upon the adjacent brain parenchyma. Prominence of the sulci and ventricles identified compatible with brain atrophy. There is mild low attenuation within the subcortical and periventricular white matter compatible with chronic small vessel ischemic change. Vascular: No hyperdense vessel or unexpected calcification. Skull: Normal. Negative for fracture or focal lesion. Sinuses/Orbits: There is a fluid level within the right maxillary sinus. Partial opacification of the left mastoid air cells noted. Other: There is right-sided frontal scout hematoma. CT MAXILLOFACIAL FINDINGS Osseous: Fracture through the posterolateral wall of the right maxillary sinus is identified. Hemorrhage with fluid level identified within the right maxillary sinus. Orbits: Right posterior floor of orbit fracture is identified, image 34 of series 8. Sinuses: Fluid level identified within the right maxillary sinus. This likely reflects hemorrhage. Soft tissues: There is right-sided preseptal hematoma. IMPRESSION: 1. Small 3 mm left posterior parafalcine subdural hematoma. 2. Small vessel ischemic change and brain atrophy. 3. Right posterior floor of orbit fracture and right lateral maxillary sinus wall fracture. 4. Hemorrhage into the right maxillary sinus. 5. Right-sided preseptal hematoma and right frontal scalp hematoma. Critical Value/emergent results were called by telephone at the time of interpretation on 07/13/2017 at 3:48 pm to Dr. Lajean Saver , who verbally acknowledged these results. Electronically Signed   By:  Kerby Moors M.D.   On: 07/13/2017 15:49   Ct Maxillofacial Wo Contrast  Result Date: 07/13/2017 CLINICAL DATA:  Status post fall.  Bruising around right orbit. EXAM: CT HEAD WITHOUT CONTRAST CT MAXILLOFACIAL WITHOUT CONTRAST TECHNIQUE: Multidetector CT imaging of the head and maxillofacial structures were performed using the standard protocol without intravenous contrast. Multiplanar CT image reconstructions of the maxillofacial structures were also generated. COMPARISON:  None. FINDINGS: CT HEAD FINDINGS Brain: There left-sided posterior parafalcine hyperdensity suspicious for a small subdural hematoma. This measures approximately 3 mm in maximum thickness. No significant mass effect upon the adjacent brain parenchyma. Prominence of the sulci and ventricles  identified compatible with brain atrophy. There is mild low attenuation within the subcortical and periventricular white matter compatible with chronic small vessel ischemic change. Vascular: No hyperdense vessel or unexpected calcification. Skull: Normal. Negative for fracture or focal lesion. Sinuses/Orbits: There is a fluid level within the right maxillary sinus. Partial opacification of the left mastoid air cells noted. Other: There is right-sided frontal scout hematoma. CT MAXILLOFACIAL FINDINGS Osseous: Fracture through the posterolateral wall of the right maxillary sinus is identified. Hemorrhage with fluid level identified within the right maxillary sinus. Orbits: Right posterior floor of orbit fracture is identified, image 34 of series 8. Sinuses: Fluid level identified within the right maxillary sinus. This likely reflects hemorrhage. Soft tissues: There is right-sided preseptal hematoma. IMPRESSION: 1. Small 3 mm left posterior parafalcine subdural hematoma. 2. Small vessel ischemic change and brain atrophy. 3. Right posterior floor of orbit fracture and right lateral maxillary sinus wall fracture. 4. Hemorrhage into the right maxillary sinus.  5. Right-sided preseptal hematoma and right frontal scalp hematoma. Critical Value/emergent results were called by telephone at the time of interpretation on 07/13/2017 at 3:48 pm to Dr. Lajean Saver , who verbally acknowledged these results. Electronically Signed   By: Kerby Moors M.D.   On: 07/13/2017 15:49    Review of Systems  Constitutional: Negative for chills and fever.  Eyes: Positive for pain. Negative for blurred vision and double vision.  Respiratory: Negative for cough.   Cardiovascular: Negative for chest pain, palpitations, orthopnea, claudication and leg swelling.  Gastrointestinal: Negative for abdominal pain, nausea and vomiting.  Genitourinary: Negative for dysuria.  Neurological: Positive for dizziness and headaches. Negative for focal weakness and seizures.   Blood pressure 132/70, pulse 72, temperature 97.7 F (36.5 C), temperature source Oral, resp. rate 20, height '5\' 6"'  (1.676 m), weight 54 kg (119 lb), SpO2 97 %. Physical Exam  HENT:  Head: Normocephalic.  Ecchymosis around the right eye  Eyes: Pupils are equal, round, and reactive to light. Left eye exhibits no discharge.  Neck: Normal range of motion. Neck supple. No JVD present. No tracheal deviation present. No thyromegaly present.  Cardiovascular: Normal rate and regular rhythm.   Murmur (2/6 systolic murmur noted) heard. Respiratory: Effort normal and breath sounds normal. No respiratory distress. She has no wheezes. She has no rales.  GI: Soft. Bowel sounds are normal. She exhibits no distension. There is no tenderness.  Musculoskeletal: She exhibits no edema, tenderness or deformity.  Neurological: She is alert.    Assessment/Plan: Status post questionable syncope/recurrent fall rule out cardiac arrhythmias Subdural hematoma Closed fracture of the right orbit Hypertension controlled by diet Non-insulin-dependent diabetes mellitus controlled by diet Remote CVA Hyperlipidemia Unsteady gait Possible  vascular dementia Plan Continue present management Continue telemetry monitoring while in the hospital Will arrange for an event monitor as outpatient once stable from neuro point of view Consider skilled nursing facility Agree with OT PT consult and increase ambulation as tolerated John Vasconcelos 07/14/2017, 12:19 PM

## 2017-07-14 NOTE — Progress Notes (Signed)
Patient is very anxious. States "I'm afraid of falling" as the NT and myself was walking to bathroom. Very unsteady on her feet. States "a walker is a hinderance." She also told me she falls frequently with no loc. Requested all four side rails be up up. They are. Bed alarm on. Safety maintained.

## 2017-07-14 NOTE — Evaluation (Signed)
Occupational Therapy Evaluation Patient Details Name: Olivia Robertson MRN: [id number] DOB: [birth year] Today's Date: 07/14/2017    History of Present Illness 81 y.o. female presenting with eye pain and headache after she fell and hit her head 7/28 at 1:00AM. PMH is significant for HLD, anxiety, hx of TIA, and low appetite. Imaging revealed Subdural hematoma 3cm.    Clinical Impression   PTA Pt independent in ADL and mobility in home. Pt is currently mod A for ADL and min A for mobility with HHA. Pt does not like using RW "It's more harm than good". Pt's independence in ADL is impacted by her visual disturbance (from trauma) and anxiety for falling. Pt will require skilled OT in the acute setting and will require SNF level therapy at discharge as she lives alone and is not safe to be home alone. [facility]. Next session to focus on visual strategies to compensate for disturbance while she is healing.     Follow Up Recommendations  SNF;Supervision/Assistance - 24 hour    Equipment Recommendations  None recommended by OT (Pt has appropriate DME)    Recommendations for Other Services       Precautions / Restrictions Precautions Precautions: Fall Restrictions Weight Bearing Restrictions: No      Mobility Bed Mobility Overal bed mobility: Needs Assistance Bed Mobility: Supine to Sit     Supine to sit: Mod assist;HOB elevated     General bed mobility comments: assist to bring hips EOB with bed pad, and trunk elevation - Pt with shortess of breath due to anxiety  Transfers Overall transfer level: Needs assistance Equipment used: 1 person hand held assist Transfers: Sit to/from Stand Sit to Stand: Min assist         General transfer comment: anxiety increases as she goes from sit to stand, assist to steady with balance    Balance Overall balance assessment: History of Falls;Needs assistance Sitting-balance  support: Feet supported;Bilateral upper extremity supported Sitting balance-Leahy Scale: Fair     Standing balance support: Single extremity supported;During functional activity Standing balance-Leahy Scale: Poor Standing balance comment: reliant on UE support and, anxious                           ADL either performed or assessed with clinical judgement   ADL Overall ADL's : Needs assistance/impaired Eating/Feeding: Set up;Sitting   Grooming: Wash/dry hands;Wash/dry face;Minimal assistance;Standing Grooming Details (indicate cue type and reason): min A for balance and anxiety at sink level Upper Body Bathing: Set up;Sitting   Lower Body Bathing: Min guard;Sitting/lateral leans   Upper Body Dressing : Moderate assistance   Lower Body Dressing: Moderate assistance;Sit to/from stand   Toilet Transfer: Moderate assistance;Ambulation (HHA - refuses walker) Toilet Transfer Details (indicate cue type and reason): Pt very anxious as soon as transferred sit to stand, shortness of breath and grunting/moaning like she was in pain. Pt denies pain and states "That's just how anxious I am" Toileting- Clothing Manipulation and Hygiene: Moderate assistance;Sit to/from stand Toileting - Clothing Manipulation Details (indicate cue type and reason): vc to use grab bar; "please hold onto me" for balance, and OT performed rear peri care as Pt was too anxious to reach around and try despite encouragement     Functional mobility during ADLs: Moderate assistance (HHA) General ADL Comments: min A for standing balance, Pt more independent in seated ADL     Vision  Patient Visual Report: Blurring of vision;Other (comment) (Trauma to R eye is causing visual disturbance)       Perception     Praxis      Pertinent Vitals/Pain Pain Assessment: Faces Faces Pain Scale: Hurts little more Pain Location: R eye, face and roof of mouth Pain Descriptors / Indicators: Discomfort;Sharp;Tender Pain  Intervention(s): Monitored during session;Repositioned     Hand Dominance Right   Extremity/Trunk Assessment Upper Extremity Assessment Upper Extremity Assessment: Generalized weakness   Lower Extremity Assessment Lower Extremity Assessment: Defer to PT evaluation   Cervical / Trunk Assessment Cervical / Trunk Assessment: Kyphotic   Communication Communication Communication: Expressive difficulties   Cognition Arousal/Alertness: Awake/alert Behavior During Therapy: Anxious Overall Cognitive Status: Impaired/Different from baseline Area of Impairment: Safety/judgement                         Safety/Judgement: Decreased awareness of safety;Decreased awareness of deficits     General Comments: "I am scared to go home" but refusing SNF   General Comments  Pt had a really bad experience at [facility] the last time she went to a SNF. She stated it took them over an hour to let me use the bathroom. This therapist believes that the safest thing for the patient would be to consider a higher level of care/assist on a more permenant basis.    Exercises     Shoulder Instructions      Home Living Family/patient expects to be discharged to:: Private residence Living Arrangements: Alone (has indoor cat) Available Help at Discharge: Family;Available PRN/intermittently (work full time during the day) Type of Home: Apartment Home Access: Level entry     Home Layout: One level     Bathroom Shower/Tub: Tub/shower unit;Curtain   Firefighter: Standard     Home Equipment: Environmental consultant - 2 wheels;Shower seat;Cane - single point   Additional Comments: does not drive but doing IADL, ADL      Prior Functioning/Environment Level of Independence: Independent with assistive device(s)        Comments: recently got a cane        OT Problem List: Decreased activity tolerance;Impaired balance (sitting and/or standing);Impaired vision/perception;Decreased safety  awareness;Decreased knowledge of use of DME or AE;Pain      OT Treatment/Interventions: DME and/or AE instruction;Therapeutic activities;Therapeutic exercise;Self-care/ADL training;Visual/perceptual remediation/compensation;Patient/family education;Balance training    OT Goals(Current goals can be found in the care plan section) Acute Rehab OT Goals Patient Stated Goal: to be safe OT Goal Formulation: With patient Time For Goal Achievement: 07/28/17 Potential to Achieve Goals: Good ADL Goals Pt Will Perform Eating: with modified independence;sitting (demonstrating compensatory techniques for R blurry vision) Pt Will Perform Grooming: with supervision;standing Pt Will Perform Upper Body Bathing: with modified independence;sitting Pt Will Perform Lower Body Bathing: with modified independence;with adaptive equipment;sitting/lateral leans Pt Will Transfer to Toilet: with supervision;ambulating Pt Will Perform Toileting - Clothing Manipulation and hygiene: with modified independence;sit to/from stand Additional ADL Goal #1: Pt will verbalize 2 strategies for calming anxiety prior to ADL participation with no cues.  OT Frequency: Min 2X/week   Barriers to D/C: Decreased caregiver support  Pt lives alone       Co-evaluation              AM-PAC PT "6 Clicks" Daily Activity     Outcome Measure Help from another person eating meals?: A Lot Help from another person taking care of personal grooming?: A Lot Help from another person toileting,  which includes using toliet, bedpan, or urinal?: A Lot Help from another person bathing (including washing, rinsing, drying)?: A Little Help from another person to put on and taking off regular upper body clothing?: A Little Help from another person to put on and taking off regular lower body clothing?: A Lot 6 Click Score: 14   End of Session Equipment Utilized During Treatment: Gait belt Nurse Communication: Mobility status  Activity  Tolerance: Patient tolerated treatment well;Other (comment) (Pt very anxious with mobility) Patient left: in chair;with call bell/phone within reach;with chair alarm set  OT Visit Diagnosis: Unsteadiness on feet (R26.81);Other abnormalities of gait and mobility (R26.89);Repeated falls (R29.6);History of falling (Z91.81);Low vision, both eyes (H54.2);Pain Pain - Right/Left: Right Pain - part of body:  (head/eye)                Time: [id number] OT Time Calculation (min): 33 min Charges:  OT General Charges $OT Visit: 1 Procedure OT Evaluation $OT Eval Moderate Complexity: 1 Procedure OT Treatments $Self Care/Home Management : 8-22 mins G-Codes: OT G-codes **NOT FOR INPATIENT CLASS** Functional Assessment Tool Used: AM-PAC 6 Clicks Daily Activity Functional Limitation: Self care Self Care Current Status (V4098(G8987): At least 40 percent but less than 60 percent impaired, limited or restricted Self Care Goal Status (J1914(G8988): At least 1 percent but less than 20 percent impaired, limited or restricted   Sherryl MangesLaura Indiana Pechacek OTR/L [phone]  Olivia Robertson 07/14/2017, 4:08 PM

## 2017-07-14 NOTE — Progress Notes (Signed)
Patient arrived to floor via stretcher. Alert and oriented, She can express her needs. Box 18 was called in and verified with continuous pulse ox. She arrived to the floor with no IV. She states "I didn't refuse it, I just didn't see any reason to have one" She has delayed responses, is tearful and anxious. Comfort given. Bed alarm on. Safety maintained.

## 2017-07-15 ENCOUNTER — Encounter (HOSPITAL_COMMUNITY): Payer: Self-pay | Admitting: Family Medicine

## 2017-07-15 DIAGNOSIS — Z515 Encounter for palliative care: Secondary | ICD-10-CM

## 2017-07-15 DIAGNOSIS — W19XXXD Unspecified fall, subsequent encounter: Secondary | ICD-10-CM

## 2017-07-15 DIAGNOSIS — S0280XD Fracture of other specified skull and facial bones, unspecified side, subsequent encounter for fracture with routine healing: Secondary | ICD-10-CM

## 2017-07-15 DIAGNOSIS — R54 Age-related physical debility: Secondary | ICD-10-CM

## 2017-07-15 DIAGNOSIS — I700 Atherosclerosis of aorta: Secondary | ICD-10-CM | POA: Diagnosis present

## 2017-07-15 DIAGNOSIS — S0280XA Fracture of other specified skull and facial bones, unspecified side, initial encounter for closed fracture: Secondary | ICD-10-CM

## 2017-07-15 DIAGNOSIS — Y92009 Unspecified place in unspecified non-institutional (private) residence as the place of occurrence of the external cause: Secondary | ICD-10-CM

## 2017-07-15 DIAGNOSIS — E440 Moderate protein-calorie malnutrition: Secondary | ICD-10-CM | POA: Diagnosis present

## 2017-07-15 DIAGNOSIS — I6200 Nontraumatic subdural hemorrhage, unspecified: Secondary | ICD-10-CM

## 2017-07-15 HISTORY — DX: Age-related physical debility: R54

## 2017-07-15 HISTORY — DX: Atherosclerosis of aorta: I70.0

## 2017-07-15 MED ORDER — BOOST PLUS PO LIQD
237.0000 mL | Freq: Three times a day (TID) | ORAL | Status: DC
  Administered 2017-07-15 – 2017-07-17 (×6): 237 mL via ORAL
  Filled 2017-07-15 (×10): qty 237

## 2017-07-15 MED ORDER — ONDANSETRON 4 MG PO TBDP
4.0000 mg | ORAL_TABLET | Freq: Three times a day (TID) | ORAL | Status: DC
  Administered 2017-07-15 – 2017-07-17 (×7): 4 mg via ORAL
  Filled 2017-07-15 (×7): qty 1

## 2017-07-15 NOTE — Progress Notes (Addendum)
Initial Nutrition Assessment  DOCUMENTATION CODES:   Non-severe (moderate) malnutrition in context of chronic illness  INTERVENTION:    Continue Boost Plus po TID, each supplement provides 360 kcals and 14 grams of protein  NUTRITION DIAGNOSIS:   Malnutrition (moderate) related to  (failure to thrive) as evidenced by moderate depletions of muscle mass, moderate depletion of body fat  GOAL:   Patient will meet greater than or equal to 90% of their needs  MONITOR:   PO intake, Supplement acceptance, Labs, Skin, I & O's  REASON FOR ASSESSMENT:   Consult Poor PO  ASSESSMENT:   81 y.o. Female presenting with eye pain and headache after she fell and hit her head 7/28 at 1:00AM. PMH is significant for HLD, anxiety, hx of TIA, and low appetite.  Pt with breakfast on tray table untouched.  States she's afraid to eat it bc it'll make her vomit. Reports a poor appetite "for a while now". Also reveals she's lost 40 lbs, however, unable to identify time frame. Per wt readings below, weight has been stable. Has Boost Plus nutrition supplements ordered. Says she likes them. Medications reviewed and include Remeron. Labs reviewed.  Nutrition-Focused physical exam completed. Findings are moderate fat depletion, moderate muscle depletion, and no edema.   Diet Order:  Diet Heart Room service appropriate? Yes; Fluid consistency: Thin  Skin:  Wound (see comment) (bruise around R eye)  Last BM:  7/29  Height:   Ht Readings from Last 1 Encounters:  07/13/17 5\' 6"  (1.676 m)   Weight:   Wt Readings from Last 1 Encounters:  07/14/17 119 lb (54 kg)   Wt Readings from Last 10 Encounters:  07/14/17 119 lb (54 kg)  05/07/17 121 lb (54.9 kg)  03/28/17 121 lb 12.8 oz (55.2 kg)  03/13/17 120 lb (54.4 kg)  03/09/17 120 lb (54.4 kg)  11/16/16 123 lb (55.8 kg)  09/04/16 127 lb 6.4 oz (57.8 kg)  08/09/16 130 lb (59 kg)  06/01/16 130 lb 9.6 oz (59.2 kg)  03/01/16 128 lb (58.1 kg)    Ideal Body Weight:  59 kg  BMI:  Body mass index is 19.21 kg/m.  Estimated Nutritional Needs:   Kcal:  1500-1700  Protein:  65-75 gm  Fluid:  1.5-1.7 L  EDUCATION NEEDS:   No education needs identified at this time  Maureen ChattersKatie Fady Stamps, RD, LDN Pager #: [phone] After-Hours Pager #: [phone]

## 2017-07-15 NOTE — Clinical Social Work Note (Signed)
Clinical Social Work Assessment  Patient Details  Name: Olivia Robertson MRN: [id number] Date of Birth: [birth year]  Date of referral:  07/15/17               Reason for consult:  Facility Placement, Discharge Planning                Permission sought to share information with:  Facility Sport and exercise psychologist, Family Supports Permission granted to share information::  Yes, Verbal Permission Granted  Name::     Berna Spare  Agency::  SNF  Relationship::  Son, Daughter  Contact Information:     Housing/Transportation Living arrangements for the past 2 months:  Single Family Home Source of Information:  Patient Patient Interpreter Needed:  None Criminal Activity/Legal Involvement Pertinent to Current Situation/Hospitalization:  No - Comment as needed Significant Relationships:  Adult Children Lives with:  Self Do you feel safe going back to the place where you live?  Yes Need for family participation in patient care:  No (Coment)  Care giving concerns:  Patient currently lives at home alone, and will need short term rehab placement prior to returning home.   Social Worker assessment / plan:  CSW met with patient and discussed recommendation for SNF. Patient acknowledged previous bad experience at [facility], and that she would want to go somewhere where she could get better care. CSW gave facility list, and patient discussed that her son, Aaron Edelman, helped her make decisions. Patient gave permission to contact Aaron Edelman for help, but wanted to know what exactly CSW would be discussing with him, because she still wanted to make her own decisions. CSW assured her that reaching out to the patient's son would only be to help in determining which facilities would be good options for the patient for rehab. Patient indicated understanding. CSW faxed out referral, and will follow up to determine facility preference.  Employment status:  Retired Nurse, adult PT  Recommendations:  [place] / Referral to community resources:  [place]  Patient/Family's Response to care:  Patient agreeable to SNF placement.  Patient/Family's Understanding of and Emotional Response to Diagnosis, Current Treatment, and Prognosis:  Patient understands the need for short term rehab at discharge, and is hopeful that she will not have to stay for very long before returning home. Patient became agitated during discussion at times because she is having trouble with her focus and word recall, and was frustrated with her difficulties in communicating. Patient appreciated the help in determining placement.  Emotional Assessment Appearance:  Appears stated age Attitude/Demeanor/Rapport:    Affect (typically observed):  Agitated, Frustrated, Appropriate Orientation:  Oriented to Self, Oriented to Place, Oriented to  Time, Oriented to Situation Alcohol / Substance use:  Not Applicable Psych involvement (Current and /or in the community):  No (Comment)  Discharge Needs  Concerns to be addressed:  Care Coordination, Discharge Planning Concerns Readmission within the last 30 days:    Current discharge risk:  Lives alone, Physical Impairment, Cognitively Impaired Barriers to Discharge:  Continued Medical Work up, [place], LCSW 07/15/2017, 1:32 PM
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
## 2017-07-15 NOTE — Telephone Encounter (Signed)
Nichollette informed about approved verbal orders from pcp. Deseree Bruna PotterBlount, CMA

## 2017-07-15 NOTE — Progress Notes (Signed)
Family Medicine Teaching Service Daily Progress Note Intern Pager: [phone]  Patient name: Olivia Robertson Medical record number: [id number] Date of birth: Apr 24, 1936 Age: 81 y.o. Gender: female  Primary Care Provider: Freddrick MarchAmin, Yashika, MD Consultants: Neurosurgery, maxillofacial surgery Code Status: DO NOT RESUSCITATE  Pt Overview and Major Events to Date:  07/28: Admit for 3 mm subdural hematoma and right posterior floor orbital fracture per CT, nonsurgical per neurosurgery and oral surgery 07/29: Repeat CT shows unchanged subdural hematoma  Assessment and Plan: Olivia Robertson is a 81 y.o. female presenting with eye pain and headache after she fell and hit her head 7/28 at 1:00AM. PMH is significant for HLD, anxiety, hx of TIA, and low appetite.  Subdural hematoma:  Acute. Stable per neurosurgery evaluation and repeat CT. Original CT showed 3 mm subdural hematoma secondary to patient's fall. Patient endorsing intermittent headache and edema on right eye which is improved this morning. Patient without AMS or generalized weakness. No signs for surgical intervention per neurosurgery. --Neuro checks Q4H --Holding ASA 81 mg --Acetaminophen 650 mg for moderate pain PRN, tramadol 50 mg for severe pain PRN --neurosurgery recommends no further imaging unless patient has an acute change in mental status  Orbital fracture of right eye:  Acute. Stable. CT indicates right posterior floor orbital fracture and right lateral maxillary sinus fracture secondary to patient's fall. Oral surgery consulted in ED determining no need for surgical intervention and recommending Augmentin prophylactically for 1 week. Patient currently on Levaquin due to penicillin allergy. --Levaquin 500 mg daily (day 2 of 7, 7/28-)  Syncope:  Acute. Uncertain etiology. Patient has history of some similar syncopal event resulting in facial trauma and left radial fracture on 02/2017. BP is remained normotensive with HR  high 60-70s. Patient does not take BP meds at home. EKG unremarkable. Echo on 02/2017 unremarkable without indication of EF. No formal cardiology consult during last syncopal episode. Concerning given to admissions for syncope resulting in significant trauma. Suspect this is secondary to arrhythmia. --Cardiology consulted, appreciate recommendations - have signed off  --PT/OT both recommend SNF  HLD:   Chronic. Stable. On high intensity statin.  --Continue home atorvastatin 40 mg daily   Anxiety:  Chronic. Stable. Patient originally anxious on exam during admission requiring redirection and easily flustered. Patient now calm without signs of anxiety. --Continue home Remeron 15 mg daily --inquire about discontinuation of Zoloft  Hx of TIA:  History there are no records of detail. Currently on ASA and high intensity statin. --Holding ASA due to active bleed, continue atorvastatin 40 mg daily  FEN/GI: Heart healthy diet, MiraLAX PRN Prophylaxis: SCDs due to subdural hematoma orbital fracture  Disposition: SNF  Subjective:  Patient states that she feels "lousy" and that she doesn't want to eat because she might throw up her food, although she denies feeling nauseated.  She has a headache but has not taken tyenol for it because she only takes it when her pain is severe.  She also said she felt very cold and started shaking even after I put three additional blankets on her.    Objective: Temp:  [97.7 F (36.5 C)-98.3 F (36.8 C)] 97.8 F (36.6 C) (07/30 0700) Pulse Rate:  [62-72] 69 (07/30 0700) Resp:  [16-20] 18 (07/30 0700) BP: (111-153)/(60-70) 127/69 (07/30 0700) SpO2:  [97 %-100 %] 97 % (07/30 0700) Weight:  [119 lb (54 kg)] 119 lb (54 kg) (07/29 1108) Physical Exam: General: frail, thin, elderly woman lying in bed, in mild distress but able  to be calmed, with non-toxic appearance CV: regular rate and rhythm without murmurs, rubs, or gallops Lungs: clear to auscultation  bilaterally with normal work of breathing Abdomen: soft, non-tender, normoactive bowel sounds Extremities: warm and well perfused, normal tone Psych: anxious mood, congruent affect  Laboratory:  Recent Labs Lab 07/13/17 1353 07/14/17 0329  WBC 6.8 5.9  HGB 11.9* 10.3*  HCT 39.1 33.6*  PLT 304 259    Recent Labs Lab 07/13/17 1353  NA 138  K 4.0  CL 106  CO2 22  BUN 21*  CREATININE 0.68  CALCIUM 9.4  GLUCOSE 229*   INR: 1.03 UA: Moderate leukocytes, squames 0-5, otherwise negative EKG: Normal sinus rhythm  Imaging/Diagnostic Tests: Repeat CT Head and Maxillofacial WO Contrast (07/14/2017) IMPRESSION: 1. Unchanged tiny parafalcine subdural hematoma. 2. No new intracranial abnormality.  CT Head and Maxillofacial WO Contrast (07/13/2017) IMPRESSION: 1. Small 3 mm left posterior parafalcine subdural hematoma. 2. Small vessel ischemic change and brain atrophy. 3. Right posterior floor of orbit fracture and right lateral maxillary sinus wall fracture. 4. Hemorrhage into the right maxillary sinus. 5. Right-sided preseptal hematoma and right frontal scalp hematoma. Critical Value/emergent results were called by telephone at the time of interpretation on 07/13/2017 at 3:48 pm to Dr. Cathren LaineKEVIN STEINL , who verbally acknowledged these results.  DG Chest 2 View (07/13/2017) IMPRESSION: 1. No acute cardiopulmonary abnormalities. 2. Aortic Atherosclerosis (ICD10-I70.0).  DG Thoracic/Lumbar Spine 2 View (07/13/2017) IMPRESSION: No acute fracture or listhesis of the lumbar spine. Multilevel degenerative disc disease and facet arthrosis.    Lennox SoldersWinfrey, Joab Carden C, MD 07/15/2017, 9:37 AM PGY-1, [facility] FPTS Intern pager: [phone], text pages welcome

## 2017-07-15 NOTE — Progress Notes (Signed)
Subjective:  Up in chair eating lunch. Denies any chest pain shortness of breath or palpitations. Occasional APC and 2 beats of ventricular couplets noted no significant arrhythmias.  Objective:  Vital Signs in the last 24 hours: Temp:  [97.5 F (36.4 C)-98.3 F (36.8 C)] 97.5 F (36.4 C) (07/30 1005) Pulse Rate:  [62-69] 64 (07/30 1005) Resp:  [16-18] 16 (07/30 1005) BP: (111-153)/(60-69) 125/62 (07/30 1005) SpO2:  [96 %-100 %] 96 % (07/30 1005)  Intake/Output from previous day: 07/29 0701 - 07/30 0700 In: 840 [P.O.:840] Out: -  Intake/Output from this shift: Total I/O In: 360 [P.O.:360] Out: -   Physical Exam: exam unchanged  Lab Results:  Recent Labs  07/13/17 1353 07/14/17 0329  WBC 6.8 5.9  HGB 11.9* 10.3*  PLT 304 259    Recent Labs  07/13/17 1353  NA 138  K 4.0  CL 106  CO2 22  GLUCOSE 229*  BUN 21*  CREATININE 0.68   No results for input(s): TROPONINI in the last 72 hours.  Invalid input(s): CK, MB Hepatic Function Panel No results for input(s): PROT, ALBUMIN, AST, ALT, ALKPHOS, BILITOT, BILIDIR, IBILI in the last 72 hours. No results for input(s): CHOL in the last 72 hours. No results for input(s): PROTIME in the last 72 hours.  Imaging: Imaging results have been reviewed and Dg Chest 2 View  Result Date: 07/13/2017 CLINICAL DATA:  Fall.  Evaluate for injury. EXAM: CHEST  2 VIEW COMPARISON:  07/13/2017 FINDINGS: Normal heart size. No pleural effusion or edema. Aortic atherosclerosis. Mitral valve calcifications identified. No pleural effusion or edema. No airspace opacities. IMPRESSION: 1. No acute cardiopulmonary abnormalities. 2.  Aortic Atherosclerosis (ICD10-I70.0). Electronically Signed   By: Signa Kellaylor  Stroud M.D.   On: 07/13/2017 17:39   Dg Thoracic Spine 2 View  Result Date: 07/13/2017 CLINICAL DATA:  Fall with generalized back pain. EXAM: THORACIC SPINE 2 VIEWS COMPARISON:  None. FINDINGS: No acute fracture or static subluxation of the  thoracic spine. Multilevel lower thoracic degenerative vertebral body height loss and osteophyte formation. Extensive vascular calcification. IMPRESSION: No acute fracture or listhesis of the thoracic spine. Electronically Signed   By: Deatra RobinsonKevin  Herman M.D.   On: 07/13/2017 17:41   Dg Lumbar Spine Complete  Result Date: 07/13/2017 CLINICAL DATA:  FALL EXAM: LUMBAR SPINE - COMPLETE 4+ VIEW COMPARISON:  None. FINDINGS: No acute fracture or listhesis. There is multilevel lumbar facet arthrosis. Mild vertebral body height loss at multiple levels with associated osteophyte formation but no evidence of acute compression fracture. extensive vascular calcification. IMPRESSION: No acute fracture or listhesis of the lumbar spine. Multilevel degenerative disc disease and facet arthrosis. Electronically Signed   By: Deatra RobinsonKevin  Herman M.D.   On: 07/13/2017 17:40   Ct Head Wo Contrast  Result Date: 07/14/2017 CLINICAL DATA:  Subdural hematoma status post fall. EXAM: CT HEAD WITHOUT CONTRAST TECHNIQUE: Contiguous axial images were obtained from the base of the skull through the vertex without intravenous contrast. COMPARISON:  07/13/2017 FINDINGS: Brain: Minimal hyperdensity along the posterior falx has not significantly changed and is consistent with a tiny subdural hematoma measuring up to 3 mm in thickness. There is no evidence of new intracranial hemorrhage, acute infarct, mass, or midline shift. Mild generalized cerebral atrophy and mild chronic small vessel white matter disease are unchanged. Vascular: Calcified atherosclerosis at the skullbase. No hyperdense vessel. Skull: No fracture or osseous lesion. Sinuses/Orbits: Partially visualized right maxillary sinus fluid level and right lateral sinus wall fracture, more fully evaluated on  yesterday's maxillofacial CT. Moderate left mastoid effusion. Cerumen in both external auditory canals. Bilateral cataract extraction. Other: None. IMPRESSION: 1. Unchanged tiny parafalcine  subdural hematoma. 2. No new intracranial abnormality. Electronically Signed   By: Sebastian AcheAllen  Grady M.D.   On: 07/14/2017 07:30   Ct Head Wo Contrast  Result Date: 07/13/2017 CLINICAL DATA:  Status post fall.  Bruising around right orbit. EXAM: CT HEAD WITHOUT CONTRAST CT MAXILLOFACIAL WITHOUT CONTRAST TECHNIQUE: Multidetector CT imaging of the head and maxillofacial structures were performed using the standard protocol without intravenous contrast. Multiplanar CT image reconstructions of the maxillofacial structures were also generated. COMPARISON:  None. FINDINGS: CT HEAD FINDINGS Brain: There left-sided posterior parafalcine hyperdensity suspicious for a small subdural hematoma. This measures approximately 3 mm in maximum thickness. No significant mass effect upon the adjacent brain parenchyma. Prominence of the sulci and ventricles identified compatible with brain atrophy. There is mild low attenuation within the subcortical and periventricular white matter compatible with chronic small vessel ischemic change. Vascular: No hyperdense vessel or unexpected calcification. Skull: Normal. Negative for fracture or focal lesion. Sinuses/Orbits: There is a fluid level within the right maxillary sinus. Partial opacification of the left mastoid air cells noted. Other: There is right-sided frontal scout hematoma. CT MAXILLOFACIAL FINDINGS Osseous: Fracture through the posterolateral wall of the right maxillary sinus is identified. Hemorrhage with fluid level identified within the right maxillary sinus. Orbits: Right posterior floor of orbit fracture is identified, image 34 of series 8. Sinuses: Fluid level identified within the right maxillary sinus. This likely reflects hemorrhage. Soft tissues: There is right-sided preseptal hematoma. IMPRESSION: 1. Small 3 mm left posterior parafalcine subdural hematoma. 2. Small vessel ischemic change and brain atrophy. 3. Right posterior floor of orbit fracture and right lateral  maxillary sinus wall fracture. 4. Hemorrhage into the right maxillary sinus. 5. Right-sided preseptal hematoma and right frontal scalp hematoma. Critical Value/emergent results were called by telephone at the time of interpretation on 07/13/2017 at 3:48 pm to Dr. Cathren LaineKEVIN STEINL , who verbally acknowledged these results. Electronically Signed   By: Signa Kellaylor  Stroud M.D.   On: 07/13/2017 15:49   Ct Maxillofacial Wo Contrast  Result Date: 07/13/2017 CLINICAL DATA:  Status post fall.  Bruising around right orbit. EXAM: CT HEAD WITHOUT CONTRAST CT MAXILLOFACIAL WITHOUT CONTRAST TECHNIQUE: Multidetector CT imaging of the head and maxillofacial structures were performed using the standard protocol without intravenous contrast. Multiplanar CT image reconstructions of the maxillofacial structures were also generated. COMPARISON:  None. FINDINGS: CT HEAD FINDINGS Brain: There left-sided posterior parafalcine hyperdensity suspicious for a small subdural hematoma. This measures approximately 3 mm in maximum thickness. No significant mass effect upon the adjacent brain parenchyma. Prominence of the sulci and ventricles identified compatible with brain atrophy. There is mild low attenuation within the subcortical and periventricular white matter compatible with chronic small vessel ischemic change. Vascular: No hyperdense vessel or unexpected calcification. Skull: Normal. Negative for fracture or focal lesion. Sinuses/Orbits: There is a fluid level within the right maxillary sinus. Partial opacification of the left mastoid air cells noted. Other: There is right-sided frontal scout hematoma. CT MAXILLOFACIAL FINDINGS Osseous: Fracture through the posterolateral wall of the right maxillary sinus is identified. Hemorrhage with fluid level identified within the right maxillary sinus. Orbits: Right posterior floor of orbit fracture is identified, image 34 of series 8. Sinuses: Fluid level identified within the right maxillary sinus.  This likely reflects hemorrhage. Soft tissues: There is right-sided preseptal hematoma. IMPRESSION: 1. Small 3 mm left posterior parafalcine  subdural hematoma. 2. Small vessel ischemic change and brain atrophy. 3. Right posterior floor of orbit fracture and right lateral maxillary sinus wall fracture. 4. Hemorrhage into the right maxillary sinus. 5. Right-sided preseptal hematoma and right frontal scalp hematoma. Critical Value/emergent results were called by telephone at the time of interpretation on 07/13/2017 at 3:48 pm to Dr. Cathren Laine , who verbally acknowledged these results. Electronically Signed   By: Signa Kell M.D.   On: 07/13/2017 15:49    Cardiac Studies:  Assessment/Plan:  Status post questionable syncope/recurrent fall rule out cardiac arrhythmias Subdural hematoma Closed fracture of the right orbit Hypertension controlled by diet Non-insulin-dependent diabetes mellitus controlled by diet Remote CVA Hyperlipidemia Unsteady gait Possible vascular dementia Plan Continue present management Okay to discharge to skilled nursing  from cardiac point of view.  LOS: 1 day    Rinaldo Cloud 07/15/2017, 12:23 PM

## 2017-07-15 NOTE — Progress Notes (Signed)
Saw Olivia Robertson this afternoon to clarify her wishes for discharge as well as the reason for stopping her previous prescription of Zoloft.  She says that she is amenable to SN[facility] placement as [facility], and she stopped Zoloft because it was "more detriment than benefit."  Since she seems to be very homeostenotic, I am not surprised that this medication did not end up helping her.  We will not add an SSRI at this time.  Hopefully she will be able to be discharged to a SNF in the next day or two.

## 2017-07-16 DIAGNOSIS — R54 Age-related physical debility: Secondary | ICD-10-CM

## 2017-07-16 DIAGNOSIS — Z7189 Other specified counseling: Secondary | ICD-10-CM

## 2017-07-16 DIAGNOSIS — E440 Moderate protein-calorie malnutrition: Secondary | ICD-10-CM

## 2017-07-16 DIAGNOSIS — I700 Atherosclerosis of aorta: Secondary | ICD-10-CM

## 2017-07-16 LAB — BASIC METABOLIC PANEL
ANION GAP: 7 (ref 5–15)
BUN: 26 mg/dL — ABNORMAL HIGH (ref 6–20)
CHLORIDE: 106 mmol/L (ref 101–111)
CO2: 26 mmol/L (ref 22–32)
Calcium: 9.1 mg/dL (ref 8.9–10.3)
Creatinine, Ser: 0.74 mg/dL (ref 0.44–1.00)
GFR calc non Af Amer: >60 mL/min (ref >60)
Glucose, Bld: 100 mg/dL — ABNORMAL HIGH (ref 65–99)
POTASSIUM: 3.8 mmol/L (ref 3.5–5.1)
SODIUM: 139 mmol/L (ref 135–145)

## 2017-07-16 LAB — CBC
HCT: 32.8 % — ABNORMAL LOW (ref 36.0–46.0)
HEMOGLOBIN: 9.9 g/dL — AB (ref 12.0–15.0)
MCH: 22.4 pg — AB (ref 26.0–34.0)
MCHC: 30.2 g/dL (ref 30.0–36.0)
MCV: 74.4 fL — AB (ref 78.0–100.0)
Platelets: 249 10*3/uL (ref 150–400)
RBC: 4.41 MIL/uL (ref 3.87–5.11)
RDW: 17.2 % — ABNORMAL HIGH (ref 11.5–15.5)
WBC: 6.4 10*3/uL (ref 4.0–10.5)

## 2017-07-16 LAB — VITAMIN D 25 HYDROXY (VIT D DEFICIENCY, FRACTURES): Vit D, 25-Hydroxy: 31.2 ng/mL (ref 30.0–100.0)

## 2017-07-16 MED ORDER — MIRTAZAPINE 15 MG PO TABS
7.5000 mg | ORAL_TABLET | Freq: Every day | ORAL | Status: DC
  Administered 2017-07-16: 7.5 mg via ORAL
  Filled 2017-07-16: qty 1

## 2017-07-16 NOTE — Progress Notes (Signed)
Family Medicine Teaching Service Daily Progress Note Intern Pager: [phone]  Patient name: Olivia Robertson Medical record number: [id number] Date of birth: [birth year] Age: 81 y.o. Gender: female  Primary Care Provider: Freddrick MarchAmin, Yashika, MD Consultants: Neurosurgery, maxillofacial surgery Code Status: DO NOT RESUSCITATE  Pt Overview and Major Events to Date:  07/28: Admit for 3 mm subdural hematoma and right posterior floor orbital fracture per CT, nonsurgical per neurosurgery and oral surgery 07/29: Repeat CT shows unchanged subdural hematoma  Assessment and Plan: Olivia Robertson is a 81 y.o. female presenting with eye pain and headache after she fell and hit her head 7/28 at 1:00AM. PMH is significant for HLD, anxiety, hx of TIA, and low appetite.  Subdural hematoma:  Acute. Stable per neurosurgery evaluation and repeat CT. Original CT showed 3 mm subdural hematoma secondary to patient's fall. Patient endorsing intermittent headache and edema on right eye which is improved this morning. Patient without AMS or generalized weakness. No signs for surgical intervention per neurosurgery. --Neuro checks Q4H --Holding ASA 81 mg --Acetaminophen 650 mg for moderate pain PRN, tramadol 50 mg for severe pain PRN --neurosurgery recommends no further imaging unless patient has an acute change in mental status  Orbital fracture of right eye:  Acute. Stable. CT indicates right posterior floor orbital fracture and right lateral maxillary sinus fracture secondary to patient's fall. Oral surgery consulted in ED determining no need for surgical intervention and recommending Augmentin prophylactically for 1 week. Patient currently on Levaquin due to penicillin allergy. --Levaquin 500 mg daily (day 4 of 7, 7/28-)  Syncope:  Acute. Uncertain etiology. Patient has history of some similar syncopal event resulting in facial trauma and left radial fracture on 02/2017. BP is remained normotensive with HR  high 60-70s. Patient does not take BP meds at home. EKG unremarkable. Echo on 02/2017 unremarkable without indication of EF. No formal cardiology consult during last syncopal episode. Concerning given to admissions for syncope resulting in significant trauma. Suspect this is secondary to arrhythmia. --Cardiology consulted, appreciate recommendations - have signed off  --PT/OT both recommend SNF --decrease patient's Remeron to 7.5 mg daily  HLD:   Chronic. Stable. On high intensity statin.  --Continue home atorvastatin 40 mg daily   Anxiety:  Chronic. Stable. Patient originally anxious on exam during admission requiring redirection and easily flustered. Patient now calm without signs of anxiety. --Continue home Remeron 15 mg daily   Hx of TIA:  History there are no records of detail. Currently on ASA and high intensity statin. --Holding ASA due to active bleed, continue atorvastatin 40 mg daily  FEN/GI: Heart healthy diet, MiraLAX PRN Prophylaxis: SCDs due to subdural hematoma orbital fracture  Disposition: SNF  Subjective:  Patient states that she feels okay, but is concerned about the financial ramifications of going to a SNF.  [facility], so she is very concerned about going to a SNF now, especially since she may not be able to afford it.  She became tearful during our conversation and said she "doesn't want to [facility] to anyone."  I told her that she would not be forced to do anything that she didn't want to do, but we felt that a SNF was the safest option for her.  I told her that other options involved possible ALF and home health.  I told her that I would call social work and make sure she knew what costs would be if there are any.  She also  attributes her recent fall to her Remeron, and she is interested in lowering this dose.  Objective: Temp:  [97.5 F (36.4 C)-98.6 F (37 C)] 98.1 F (36.7 C) (07/31 0608) Pulse Rate:  [64-72] 68  (07/31 0608) Resp:  [16-20] 20 (07/31 16100608) BP: (105-142)/(46-62) 105/59 (07/31 0608) SpO2:  [95 %-98 %] 95 % (07/31 [id number]) Physical Exam: General: frail, thin, elderly woman sitting in chair, in mild distress but able to be calmed, tearful CV: regular rate and rhythm without murmurs, rubs, or gallops Lungs: clear to auscultation bilaterally with normal work of breathing Abdomen: soft, non-tender, normoactive bowel sounds Extremities: warm and well perfused, normal tone Psych: anxious mood, congruent affect  Laboratory:  Recent Labs Lab 07/13/17 1353 07/14/17 0329 07/16/17 0305  WBC 6.8 5.9 6.4  HGB 11.9* 10.3* 9.9*  HCT 39.1 33.6* 32.8*  PLT 304 259 249    Recent Labs Lab 07/13/17 1353 07/16/17 0305  NA 138 139  K 4.0 3.8  CL 106 106  CO2 22 26  BUN 21* 26*  CREATININE 0.68 0.74  CALCIUM 9.4 9.1  GLUCOSE 229* 100*   INR: 1.03 UA: Moderate leukocytes, squames 0-5, otherwise negative EKG: Normal sinus rhythm  Imaging/Diagnostic Tests: Repeat CT Head and Maxillofacial WO Contrast (07/14/2017) IMPRESSION: 1. Unchanged tiny parafalcine subdural hematoma. 2. No new intracranial abnormality.  CT Head and Maxillofacial WO Contrast (07/13/2017) IMPRESSION: 1. Small 3 mm left posterior parafalcine subdural hematoma. 2. Small vessel ischemic change and brain atrophy. 3. Right posterior floor of orbit fracture and right lateral maxillary sinus wall fracture. 4. Hemorrhage into the right maxillary sinus. 5. Right-sided preseptal hematoma and right frontal scalp hematoma. Critical Value/emergent results were called by telephone at the time of interpretation on 07/13/2017 at 3:48 pm to Dr. Cathren LaineKEVIN STEINL , who verbally acknowledged these results.  DG Chest 2 View (07/13/2017) IMPRESSION: 1. No acute cardiopulmonary abnormalities. 2. Aortic Atherosclerosis (ICD10-I70.0).  DG Thoracic/Lumbar Spine 2 View (07/13/2017) IMPRESSION: No acute fracture or listhesis of the lumbar  spine. Multilevel degenerative disc disease and facet arthrosis.    Lennox SoldersWinfrey, Amanda C, MD 07/16/2017, 7:10 AM PGY-1, [facility] FPTS Intern pager: [phone], text pages welcome
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
## 2017-07-16 NOTE — ACP (Advance Care Planning) (Signed)
PMT meeting on 07/16/2017.    The following are her selections on the MOST form  1.  DNR 2.  Limited Interventions 3.  Antibiotics - determine at time of infection 4.  No. IV fluids 5.  No PEG tube or artificial feeding  She has no HCPOA.     Son Olivia Robertson is the most closely associated with her.  She checks on her 3x a week.  Her MOST form choices were reviewed with Olivia Robertson.  Olivia RichardsMarianne Aubreana Cornacchia, PA-C Palliative Medicine Pager: [phone]
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
## 2017-07-16 NOTE — Progress Notes (Signed)
Subjective:    Patient denies any chest pain or shortness of breath. Denies palpitations. No arrhythmias noted on the monitor no further episodes of syncope during the hospital stay. Objective:  Vital Signs in the last 24 hours: Temp:  [97.7 F (36.5 C)-98.6 F (37 C)] 97.7 F (36.5 C) (07/31 0935) Pulse Rate:  [68-73] 73 (07/31 0935) Resp:  [16-20] 20 (07/31 0935) BP: (105-142)/(46-62) 128/58 (07/31 0935) SpO2:  [95 %-100 %] 100 % (07/31 0935)  Intake/Output from previous day: 07/30 0701 - 07/31 0700 In: 720 [P.O.:720] Out: -  Intake/Output from this shift: Total I/O In: 240 [P.O.:240] Out: -   Physical Exam: Exam unchanged  Lab Results:  Recent Labs  07/14/17 0329 07/16/17 0305  WBC 5.9 6.4  HGB 10.3* 9.9*  PLT 259 249    Recent Labs  07/13/17 1353 07/16/17 0305  NA 138 139  K 4.0 3.8  CL 106 106  CO2 22 26  GLUCOSE 229* 100*  BUN 21* 26*  CREATININE 0.68 0.74   No results for input(s): TROPONINI in the last 72 hours.  Invalid input(s): CK, MB Hepatic Function Panel No results for input(s): PROT, ALBUMIN, AST, ALT, ALKPHOS, BILITOT, BILIDIR, IBILI in the last 72 hours. No results for input(s): CHOL in the last 72 hours. No results for input(s): PROTIME in the last 72 hours.  Imaging: Imaging results have been reviewed and No results found.  Cardiac Studies:  Assessment/Plan:  Status post questionable syncope/recurrent fall rule out cardiac arrhythmias Subdural hematoma Closed fracture of the right orbit Hypertension controlled by diet Non-insulin-dependent diabetes mellitus controlled by diet Remote CVA Hyperlipidemia Unsteady gait Possible vascular dementia Plan Awaiting skilled nursing facility I will sign off please call if needed  LOS: 2 days    Rinaldo CloudHarwani, Terrisa Curfman 07/16/2017, 11:09 AM

## 2017-07-16 NOTE — Progress Notes (Signed)
Physical Therapy Treatment Patient Details Name: Olivia Robertson MRN: [id number] DOB: March 20, 1936 Today's Date: 07/16/2017    History of Present Illness 81 y.o. female presenting with eye pain and headache after she fell and hit her head 7/28 at 1:00AM. PMH is significant for HLD, anxiety, hx of TIA, and low appetite. Imaging revealed Subdural hematoma 3cm.     PT Comments    Patient continues to require assistance for mobility due to balance, visual, and cognitive deficits increasing risk for falls. Pt is very anxious with mobility and easily agitated by word finding difficulty and short term memory deficit. Pt required assistance with drinking from a straw and with hygiene as well.  Pt will most definitely need 24 hour assistance if d/c home. Continue to recommend SNF for further skilled PT services to maximize independence and safety with mobility.    Follow Up Recommendations  SNF;Supervision/Assistance - 24 hour      Equipment Recommendations  None recommended by PT    Recommendations for Other Services       Precautions / Restrictions Precautions Precautions: Fall Restrictions Weight Bearing Restrictions: No    Mobility  Bed Mobility Overal bed mobility: Needs Assistance Bed Mobility: Supine to Sit;Sit to Supine     Supine to sit: HOB elevated;Min guard Sit to supine: Min assist   General bed mobility comments: min guard to come into sitting and min A to return to supine; mod/max A to position in bed; pt unable to follow commands for how to position in bed and became very frustrated and went from sitting to lying down several times; therapist and NT positioned pt with bed pad  Transfers Overall transfer level: Needs assistance Equipment used: 1 person hand held assist Transfers: Sit to/from Stand Sit to Stand: Min assist         General transfer comment: assist to power up into standing and for balance upon standing from EOB and commode with use of grab bar;  total A for hygiene  Ambulation/Gait Ambulation/Gait assistance: Min assist Ambulation Distance (Feet): 150 Feet Assistive device: 1 person hand held assist Gait Pattern/deviations: Step-through pattern;Decreased stride length;Shuffle;Drifts right/left;Trunk flexed;Narrow base of support Gait velocity: decreased   General Gait Details: pt declined use of RW and reported that "it's more of a hindrance than help" and reported falling over RW at home prior to this admission; pt very unsteady and requires assistance for balance and for directional cues due to visual deficits   Stairs            Wheelchair Mobility    Modified Rankin (Stroke Patients Only)       Balance Overall balance assessment: History of Falls;Needs assistance Sitting-balance support: Feet supported;Bilateral upper extremity supported Sitting balance-Leahy Scale: Fair     Standing balance support: Single extremity supported;During functional activity Standing balance-Leahy Scale: Poor Standing balance comment: reliant on UE support and, anxious                            Cognition Arousal/Alertness: Awake/alert Behavior During Therapy: Anxious;Agitated (easily agitated due to wording finding difficulties) Overall Cognitive Status: Impaired/Different from baseline Area of Impairment: Safety/judgement;Memory;Following commands;Awareness;Problem solving                     Memory: Decreased short-term memory Following Commands: Follows one step commands inconsistently;Follows one step commands with increased time Safety/Judgement: Decreased awareness of safety;Decreased awareness of deficits Awareness: Intellectual Problem Solving: Difficulty sequencing;Requires verbal  cues;Requires tactile cues General Comments: pt is very anxious with mobility and agitated due to word finding difficulties; pt with decreased short term memory with inability to finish thoughts/sentences ~50% time during  session      Exercises      General Comments        Pertinent Vitals/Pain Pain Assessment: Faces Faces Pain Scale: Hurts a little bit Pain Location: face Pain Descriptors / Indicators: Discomfort;Sore Pain Intervention(s): Monitored during session    Home Living                      Prior Function            PT Goals (current goals can now be found in the care plan section) Acute Rehab PT Goals PT Goal Formulation: With patient Time For Goal Achievement: 07/28/17 Potential to Achieve Goals: Fair Progress towards PT goals: Progressing toward goals    Frequency    Min 3X/week      PT Plan Current plan remains appropriate    Co-evaluation              AM-PAC PT "6 Clicks" Daily Activity  Outcome Measure  Difficulty turning over in bed (including adjusting bedclothes, sheets and blankets)?: A Lot Difficulty moving from lying on back to sitting on the side of the bed? : A Lot Difficulty sitting down on and standing up from a chair with arms (e.g., wheelchair, bedside commode, etc,.)?: Total Help needed moving to and from a bed to chair (including a wheelchair)?: A Lot Help needed walking in hospital room?: A Lot Help needed climbing 3-5 steps with a railing? : A Lot 6 Click Score: 11    End of Session Equipment Utilized During Treatment: Gait belt Activity Tolerance: Patient tolerated treatment well Patient left: with call bell/phone within reach;in bed;with bed alarm set;with SCD's reapplied Nurse Communication: Mobility status PT Visit Diagnosis: Unsteadiness on feet (R26.81);Muscle weakness (generalized) (M62.81);History of falling (Z91.81)     Time: 1610-96041522-1554 PT Time Calculation (min) (ACUTE ONLY): 32 min  Charges:  $Gait Training: 8-22 mins $Therapeutic Activity: 8-22 mins                    G Codes:       Erline LevineKellyn Drew Herman, PTA Pager: [phone]) [phone]     Carolynne EdouardKellyn R Debrina Kizer 07/16/2017, 4:41 PM

## 2017-07-16 NOTE — Care Management Note (Signed)
Case Management Note  Patient Details  Name: Olivia Robertson MRN: [id number] Date of Birth: [birth year]  Subjective/Objective:   Pt admitted with subdural hematoma. She is from home alone.                 Action/Plan: Plan is for SNF when medically ready. CM following.   Expected Discharge Date:                  Expected Discharge Plan:  Skilled Nursing Facility  In-House Referral:  Clinical Social Work  Discharge planning Services     Post Acute Care Choice:    Choice offered to:     DME Arranged:    DME Agency:     HH Arranged:    HH Agency:     Status of Service:  In process, will continue to follow  If discussed at Long Length of Stay Meetings, dates discussed:    Additional Comments:  Kermit BaloKelli F Ravenna Legore, RN 07/16/2017, 4:05 PM

## 2017-07-17 ENCOUNTER — Telehealth: Payer: Self-pay | Admitting: Family Medicine

## 2017-07-17 LAB — CBC
HEMATOCRIT: 33 % — AB (ref 36.0–46.0)
HEMOGLOBIN: 10.1 g/dL — AB (ref 12.0–15.0)
MCH: 23 pg — ABNORMAL LOW (ref 26.0–34.0)
MCHC: 30.6 g/dL (ref 30.0–36.0)
MCV: 75.2 fL — AB (ref 78.0–100.0)
Platelets: 207 10*3/uL (ref 150–400)
RBC: 4.39 MIL/uL (ref 3.87–5.11)
RDW: 17.4 % — AB (ref 11.5–15.5)
WBC: 5.6 10*3/uL (ref 4.0–10.5)

## 2017-07-17 LAB — BASIC METABOLIC PANEL
ANION GAP: 7 (ref 5–15)
BUN: 23 mg/dL — AB (ref 6–20)
CHLORIDE: 107 mmol/L (ref 101–111)
CO2: 27 mmol/L (ref 22–32)
Calcium: 8.9 mg/dL (ref 8.9–10.3)
Creatinine, Ser: 0.72 mg/dL (ref 0.44–1.00)
Glucose, Bld: 89 mg/dL (ref 65–99)
Potassium: 4 mmol/L (ref 3.5–5.1)
Sodium: 141 mmol/L (ref 135–145)

## 2017-07-17 MED ORDER — MIRTAZAPINE 15 MG PO TABS: 7.5000 mg | ORAL_TABLET | Freq: Every day | ORAL | 0 refills | Status: DC

## 2017-07-17 MED ORDER — LEVOFLOXACIN 500 MG PO TABS: 500.0000 mg | ORAL_TABLET | ORAL | 0 refills | Status: AC

## 2017-07-17 NOTE — Telephone Encounter (Signed)
Personal care services request form dropped off for at front desk for completion.  Verified that patient section of form has been completed.  Last DOS with PCP was 05/07/17.   Placed form in team folder to be completed by clinical staff.  Chari ManningLynette D Sells

## 2017-07-17 NOTE — Telephone Encounter (Signed)
Form placed in PCP box 

## 2017-07-17 NOTE — Progress Notes (Signed)
Family Medicine Teaching Service Daily Progress Note Intern Pager: [phone]  Patient name: Olivia Robertson Medical record number: [id number] Date of birth: 28-Dec-1935 Age: 81 y.o. Gender: female  Primary Care Provider: Freddrick MarchAmin, Yashika, MD Consultants: Neurosurgery, maxillofacial surgery Code Status: DO NOT RESUSCITATE  Pt Overview and Major Events to Date:  07/28: Admit for 3 mm subdural hematoma and right posterior floor orbital fracture per CT, nonsurgical per neurosurgery and oral surgery 07/29: Repeat CT shows unchanged subdural hematoma  Assessment and Plan: Olivia Robertson is a 81 y.o. female presenting with eye pain and headache after she fell and hit her head 7/28 at 1:00AM. PMH is significant for HLD, anxiety, hx of TIA, and low appetite.  Subdural hematoma:  Acute. Stable per neurosurgery evaluation and repeat CT. Original CT showed 3 mm subdural hematoma secondary to patient's fall. Patient endorsing intermittent headache and edema on right eye which is improved this morning. Patient without AMS or generalized weakness. No signs for surgical intervention per neurosurgery. --Neuro checks Q4H --Holding ASA 81 mg --Acetaminophen 650 mg for moderate pain PRN, tramadol 50 mg for severe pain PRN --neurosurgery recommends no further imaging unless patient has an acute change in mental status  Orbital fracture of right eye:  Acute. Stable. CT indicates right posterior floor orbital fracture and right lateral maxillary sinus fracture secondary to patient's fall. Oral surgery consulted in ED determining no need for surgical intervention and recommending Augmentin prophylactically for 1 week. Patient currently on Levaquin due to penicillin allergy.   --Levaquin 500 mg daily (day 5 of 7, 7/28-)  Syncope:  Acute. Uncertain etiology. Patient has history of some similar syncopal event resulting in facial trauma and left radial fracture on 02/2017. BP is remained normotensive with HR  high 60-70s. Patient does not take BP meds at home. EKG unremarkable. Echo on 02/2017 unremarkable without indication of EF. No formal cardiology consult during last syncopal episode. Concerning given to admissions for syncope resulting in significant trauma. Suspect this is secondary to arrhythmia. --Cardiology consulted, appreciate recommendations - have signed off  --PT/OT both recommend SNF, however, patient elects to go home with home health --decrease patient's Remeron to 7.5 mg daily, should be discontinued   HLD:   Chronic. Stable. On high intensity statin.  --Continue home atorvastatin 40 mg daily   Anxiety:  Chronic. Stable. Patient originally anxious on exam during admission requiring redirection and easily flustered. Remains anxious during subsequent days in the hospital (most likely her baseline mood), but able to be comforted. --Continue home Remeron 15 mg daily   Hx of TIA:  History there are no records of detail. Currently on ASA and high intensity statin. --Holding ASA due to active bleed, continue atorvastatin 40 mg daily  FEN/GI: Heart healthy diet, MiraLAX PRN Prophylaxis: SCDs due to subdural hematoma orbital fracture  Disposition: Home with home health  Subjective:  Patient states that she feels better this morning.  She does not recall anyone coming to talk with her about the financial aspects of going to a SNF yesterday.  She has decided to go home with home health.  Objective: Temp:  [97.4 F (36.3 C)-98.1 F (36.7 C)] 97.7 F (36.5 C) (08/01 0435) Pulse Rate:  [60-74] 60 (08/01 0435) Resp:  [16-20] 18 (08/01 0435) BP: (103-128)/(49-58) 103/55 (08/01 0435) SpO2:  [95 %-100 %] 96 % (08/01 0435) Physical Exam: General: frail, thin, sleeping in bed but easily awoken on arrival, pleasant  CV: regular rate and rhythm without murmurs, rubs, or gallops  Lungs: clear to auscultation bilaterally with normal work of breathing Abdomen: soft, non-tender,  normoactive bowel sounds Extremities: warm and well perfused, normal tone Psych: anxious mood, congruent affect  Laboratory:  Recent Labs Lab 07/13/17 1353 07/14/17 0329 07/16/17 0305  WBC 6.8 5.9 6.4  HGB 11.9* 10.3* 9.9*  HCT 39.1 33.6* 32.8*  PLT 304 259 249    Recent Labs Lab 07/13/17 1353 07/16/17 0305  NA 138 139  K 4.0 3.8  CL 106 106  CO2 22 26  BUN 21* 26*  CREATININE 0.68 0.74  CALCIUM 9.4 9.1  GLUCOSE 229* 100*   INR: 1.03 UA: Moderate leukocytes, squames 0-5, otherwise negative EKG: Normal sinus rhythm  Imaging/Diagnostic Tests: Repeat CT Head and Maxillofacial WO Contrast (07/14/2017) IMPRESSION: 1. Unchanged tiny parafalcine subdural hematoma. 2. No new intracranial abnormality.  CT Head and Maxillofacial WO Contrast (07/13/2017) IMPRESSION: 1. Small 3 mm left posterior parafalcine subdural hematoma. 2. Small vessel ischemic change and brain atrophy. 3. Right posterior floor of orbit fracture and right lateral maxillary sinus wall fracture. 4. Hemorrhage into the right maxillary sinus. 5. Right-sided preseptal hematoma and right frontal scalp hematoma. Critical Value/emergent results were called by telephone at the time of interpretation on 07/13/2017 at 3:48 pm to Dr. Cathren LaineKEVIN STEINL , who verbally acknowledged these results.  DG Chest 2 View (07/13/2017) IMPRESSION: 1. No acute cardiopulmonary abnormalities. 2. Aortic Atherosclerosis (ICD10-I70.0).  DG Thoracic/Lumbar Spine 2 View (07/13/2017) IMPRESSION: No acute fracture or listhesis of the lumbar spine. Multilevel degenerative disc disease and facet arthrosis.    Lennox SoldersWinfrey, Liberty Seto C, MD 07/17/2017, 7:23 AM PGY-1, [facility] FPTS Intern pager: [phone], text pages welcome

## 2017-07-17 NOTE — Care Management Note (Signed)
Case Management Note  Patient Details  Name: Olivia Robertson MRN: [id number] Date of Birth: 06/21/1936  Subjective/Objective:                    Action/Plan: Patient refusing SNF. CSW spoke to patients son, Arlys JohnBrian, and he is agreeable with her going back home. CM called Arlys JohnBrian and he would like to use the [facility] that has been active with [name]. CM inquired about the patient possibly going home with him or the patients daughter listed and he states that is not an option. CM informed him that the recommendations are for 24/7 supervision. Son stated he understands this but is not going to be able to provide this. He states he sees her about 3 times a week. He says he has been trying to arrange aides in the home through her Medicaid. CM offered private duty sitter list but Arlys JohnBrian states the patient can not afford these services as private pay.   CM called and spoke to Ad[facility] about the patient returning home. They are concerned about patient returning home but are willing to keep following her. Per Samantha CrimesAdacia and Arlys JohnBrian the process for getting additional help has been started but not progressed. CM was able to speak to Yisroel RammingAndrew Dellinger with [facility] who states that they have [facility] the [facility] without forms being faxed to [facility] to start the process. CM called [employer] and had the forms sent to CM. CM then called [facility] and discussed the needs of having forms filled out and sent to [facility]. Irving BurtonEmily with [facility] states she will make sure forms are given to Dr Nelson ChimesAmin to fill out and send [facility]. CM faxed forms to [facility].  Per Brian's request: CM called patients daughter, Elease Hashimotoatricia, and informed her of patients discharge and that she would need transportation home today. Elease Hashimotoatricia agrees to transport her home. CM also asked about 24/7 supervision and Elease Hashimotoatricia is not able to provide this either.   CM  consulted for Palliative care at home. CM called [facility] palliative number and left message regarding the referral. CM will follow up.   Expected Discharge Date:                  Expected Discharge Plan:  Skilled Nursing Facility  In-House Referral:  Clinical Social Work  Discharge planning Services  CM Consult  Post Acute Care Choice:  Home Health Choice offered to:  Adult Children  DME Arranged:    DME Agency:     HH Arranged:  PT, OT, RN, Nurse's Aide, Social Work [facility] Agency:     Status of Service:  Completed, signed off  If discussed at [employer] Meetings, dates discussed:    Additional Comments:  Kermit BaloKelli F Babyboy Loya, RN 07/17/2017, 1:15 PM

## 2017-07-17 NOTE — Progress Notes (Signed)
Occupational Therapy Treatment Patient Details Name: Olivia Robertson MRN: [id number] DOB: 16-Jul-1936 Today's Date: 07/17/2017    History of present illness 81 y.o. female presenting with eye pain and headache after she fell and hit her head 7/28 at 1:00AM. PMH is significant for HLD, anxiety, hx of TIA, and low appetite. Imaging revealed Subdural hematoma 3cm.    OT comments  Pr progressing towards OT goals this session. Pt continues to demonstrate decreased cognition (no retention of visual strategies during ADL), anxiety during transfers, and inability to problem solve. Pt did have overall improved mood today, but decreased awareness of safety and deficits. Pt continues to adamantly refuse SNF placement. OT still feels that this is the safest and best option considering the Pt's cognitive status, vision, and decreased ability to perform ADL and transfers safely. Since Pt is refusing, OT recommending HHOT and 24 hour supervision as Pt is a very high fall risk.  Follow Up Recommendations  Home health OT;Supervision/Assistance - 24 hour    Equipment Recommendations  None recommended by OT (Pt has appropriate DME)    Recommendations for Other Services      Precautions / Restrictions Precautions Precautions: Fall Restrictions Weight Bearing Restrictions: No       Mobility Bed Mobility Overal bed mobility: Needs Assistance Bed Mobility: Supine to Sit;Sit to Supine     Supine to sit: HOB elevated;Min guard Sit to supine: Min guard   General bed mobility comments: min guard to come into sitting and to return to supine; mod/max A to position in bed  Transfers Overall transfer level: Needs assistance Equipment used: 1 person hand held assist Transfers: Sit to/from Stand Sit to Stand: Min assist         General transfer comment: assist to power up into standing and for balance upon standing from EOB and BSC over toilet    Balance Overall balance assessment: History of  Falls;Needs assistance Sitting-balance support: Feet supported;Bilateral upper extremity supported Sitting balance-Leahy Scale: Fair     Standing balance support: Single extremity supported;During functional activity Standing balance-Leahy Scale: Poor Standing balance comment: reliant on UE support and, anxious                           ADL either performed or assessed with clinical judgement   ADL Overall ADL's : Needs assistance/impaired     Grooming: Wash/dry hands;Wash/dry face;Min guard;Standing Grooming Details (indicate cue type and reason): sink level, Pt using sink for balance                 Toilet Transfer: Minimal assistance;Ambulation;BSC (HHA refused walker) Toilet Transfer Details (indicate cue type and reason): Pt very anxious as soon as transferred sit to stand, shortness of breath and grunting/moaning like she was in pain. Pt denies pain and states "That's just how anxious I am" Toileting- Clothing Manipulation and Hygiene: Minimal assistance;Sit to/from stand Toileting - Clothing Manipulation Details (indicate cue type and reason): min A for standing balance, Pt able to perform all peri care     Functional mobility during ADLs: Minimal assistance (H[facility])       Vision   Additional Comments: Pt states that vision continues to improve, but there are still deficits and blurryness   Perception     Praxis      Cognition Arousal/Alertness: Awake/alert Behavior During Therapy: Anxious (easily agitated due to wording finding difficulties) Overall Cognitive Status: No family/caregiver present to determine baseline cognitive functioning Area of Impairment:  Safety/judgement;Memory;Following commands;Awareness;Problem solving                     Memory: Decreased short-term memory Following Commands: Follows one step commands inconsistently;Follows one step commands with increased time Safety/Judgement: Decreased awareness of safety;Decreased  awareness of deficits Awareness: Intellectual Problem Solving: Difficulty sequencing;Requires verbal cues;Requires tactile cues General Comments: overall improved mood this session; continues to be very anxious with mobility; when asked about         Exercises     Shoulder Instructions       General Comments      Pertinent Vitals/ Pain       Pain Assessment: Faces Faces Pain Scale: Hurts a little bit Pain Location: face Pain Descriptors / Indicators: Discomfort;Sore Pain Intervention(s): Monitored during session;Repositioned  Home Living                                          Prior Functioning/Environment              Frequency  Min 2X/week        Progress Toward Goals  OT Goals(current goals can now be found in the care plan section)  Progress towards OT goals: Progressing toward goals  Acute Rehab OT Goals Patient Stated Goal: to go home OT Goal Formulation: With patient Time For Goal Achievement: 07/28/17 Potential to Achieve Goals: Good  Plan Discharge plan needs to be updated (Pt refusing SNF at this time)    Co-evaluation                 AM-PAC PT "6 Clicks" Daily Activity     Outcome Measure   Help from another person eating meals?: A Lot Help from another person taking care of personal grooming?: A Little Help from another person toileting, which includes using toliet, bedpan, or urinal?: A Little Help from another person bathing (including washing, rinsing, drying)?: A Little Help from another person to put on and taking off regular upper body clothing?: A Little Help from another person to put on and taking off regular lower body clothing?: A Lot 6 Click Score: 16    End of Session Equipment Utilized During Treatment: Gait belt  OT Visit Diagnosis: Unsteadiness on feet (R26.81);Other abnormalities of gait and mobility (R26.89);Repeated falls (R29.6);History of falling (Z91.81);Low vision, both eyes  (H54.2);Pain Pain - Right/Left: Right Pain - part of body:  (head/face)   Activity Tolerance Patient tolerated treatment well   Patient Left in bed;with call bell/phone within reach;with bed alarm set   Nurse Communication Mobility status        Time: [id number] OT Time Calculation (min): 32 min  Charges: OT General Charges $OT Visit: 1 Procedure OT Treatments $Self Care/Home Management : 23-37 mins  Sherryl MangesLaura Giavonni Cizek OTR/L [phone]  Olivia Robertson 07/17/2017, 4:54 PM

## 2017-07-17 NOTE — Progress Notes (Signed)
CSW spoke with patient's son, Arlys JohnBrian, over the phone to discuss discharge planning and placement options. Patient's son expressed significant concern about pursuing SNF placement. Patient's son discussed at length about [facility], and how the patient did not get any of the care that she needed and they would not want to go through that experience again, even at another facility. Patient's son was very interested in talking about what options he would have available to try to bring the patient home instead. Patient's son also discussed how he had a conversation with the palliative team and that the patient may have 6 months to a year left to live, and if that was the case, he would want her home and not a facility.   CSW alerted RNCM. CSW will follow in case plans change, but for now it seems that the family will refuse SNF.   Blenda NicelyElizabeth Krystalynn Ridgeway, [place] Clinical Social Worker [phone]

## 2017-07-19 ENCOUNTER — Telehealth: Payer: Self-pay | Admitting: Family Medicine

## 2017-07-19 DIAGNOSIS — E785 Hyperlipidemia, unspecified: Secondary | ICD-10-CM | POA: Diagnosis not present

## 2017-07-19 DIAGNOSIS — F0390 Unspecified dementia without behavioral disturbance: Secondary | ICD-10-CM | POA: Diagnosis not present

## 2017-07-19 DIAGNOSIS — S52502D Unspecified fracture of the lower end of left radius, subsequent encounter for closed fracture with routine healing: Secondary | ICD-10-CM | POA: Diagnosis not present

## 2017-07-19 DIAGNOSIS — F4310 Post-traumatic stress disorder, unspecified: Secondary | ICD-10-CM | POA: Diagnosis not present

## 2017-07-19 DIAGNOSIS — S022XXD Fracture of nasal bones, subsequent encounter for fracture with routine healing: Secondary | ICD-10-CM | POA: Diagnosis not present

## 2017-07-19 DIAGNOSIS — F411 Generalized anxiety disorder: Secondary | ICD-10-CM | POA: Diagnosis not present

## 2017-07-19 DIAGNOSIS — D649 Anemia, unspecified: Secondary | ICD-10-CM | POA: Diagnosis not present

## 2017-07-19 DIAGNOSIS — Z87440 Personal history of urinary (tract) infections: Secondary | ICD-10-CM | POA: Diagnosis not present

## 2017-07-19 DIAGNOSIS — I10 Essential (primary) hypertension: Secondary | ICD-10-CM | POA: Diagnosis not present

## 2017-07-19 NOTE — Telephone Encounter (Signed)
[facility]:  Needs verbal orders for nurse visit for 2 times a week for 2 weeks, home health aide 3 times for 2 weeks, PT and OV to evauluate.  Can leave vm.

## 2017-07-21 DIAGNOSIS — S52502D Unspecified fracture of the lower end of left radius, subsequent encounter for closed fracture with routine healing: Secondary | ICD-10-CM | POA: Diagnosis not present

## 2017-07-21 DIAGNOSIS — F0390 Unspecified dementia without behavioral disturbance: Secondary | ICD-10-CM | POA: Diagnosis not present

## 2017-07-21 DIAGNOSIS — D649 Anemia, unspecified: Secondary | ICD-10-CM | POA: Diagnosis not present

## 2017-07-21 DIAGNOSIS — I10 Essential (primary) hypertension: Secondary | ICD-10-CM | POA: Diagnosis not present

## 2017-07-21 DIAGNOSIS — S022XXD Fracture of nasal bones, subsequent encounter for fracture with routine healing: Secondary | ICD-10-CM | POA: Diagnosis not present

## 2017-07-21 DIAGNOSIS — F411 Generalized anxiety disorder: Secondary | ICD-10-CM | POA: Diagnosis not present

## 2017-07-21 DIAGNOSIS — F4310 Post-traumatic stress disorder, unspecified: Secondary | ICD-10-CM | POA: Diagnosis not present

## 2017-07-21 DIAGNOSIS — Z87440 Personal history of urinary (tract) infections: Secondary | ICD-10-CM | POA: Diagnosis not present

## 2017-07-21 DIAGNOSIS — E785 Hyperlipidemia, unspecified: Secondary | ICD-10-CM | POA: Diagnosis not present

## 2017-07-22 ENCOUNTER — Ambulatory Visit (INDEPENDENT_AMBULATORY_CARE_PROVIDER_SITE_OTHER): Payer: Medicare HMO | Admitting: Internal Medicine

## 2017-07-22 ENCOUNTER — Encounter: Payer: Self-pay | Admitting: Internal Medicine

## 2017-07-22 VITALS — BP 120/80 | HR 82 | Temp 98.6°F | Ht 66.0 in | Wt 121.0 lb

## 2017-07-22 DIAGNOSIS — Z09 Encounter for follow-up examination after completed treatment for conditions other than malignant neoplasm: Secondary | ICD-10-CM

## 2017-07-22 MED ORDER — ASPIRIN EC 81 MG PO TBEC: 81.0000 mg | DELAYED_RELEASE_TABLET | Freq: Every day | ORAL | 2 refills | Status: AC

## 2017-07-22 NOTE — Patient Instructions (Addendum)
Ms. [facility] [employer],  Please stop remeron and restart daily aspirin 81 mg.  I would like for you to follow-up at our [facility] with Dr. McDiarmid.  If there are continued issues with [facility], please call our clinic and Alecia LemmingLauren Ducatte will assist you.  Best, Dr. Sampson GoonFitzgerald

## 2017-07-23 DIAGNOSIS — Z09 Encounter for follow-up examination after completed treatment for conditions other than malignant neoplasm: Secondary | ICD-10-CM | POA: Insufficient documentation

## 2017-07-23 NOTE — Telephone Encounter (Signed)
Verbal orders needed for PT for 2 times a week for 2 weeks
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
## 2017-07-23 NOTE — Assessment & Plan Note (Addendum)
-   Stable since discharge for SDH without red flag signs of headaches, vision changes, or somnolence. Will restart baby aspirin, as recent studies have not shown increased risk of hematoma reaccummulation/worsening with early resumption of asa and patient at high CVD risk with prior TIA. - No more falls since d/c. Recommended discontinuing remeron, as patient feels this contributes to unsteadiness. Recommended close follow-up with [facility] for memory and functional assessment. Will eventually need substitute medication for mood but will involve more extensive discussion, as has not had improvement with celexa or zoloft.  - Spoke with RN Alecia LemmingLauren Ducatte about patient's difficulties receiving Personal Care Services. Did not choose to refer to new agency today, as [facility] is supposed to come to patient's home in 2 days. Asked patient to contact [facility] if visit does not occur. Will also cc LCSW Sammuel Hineseborah Moore, per Leotis ShamesLauren.

## 2017-07-23 NOTE — Telephone Encounter (Signed)
Ms white called about the verbal orders needed

## 2017-07-25 ENCOUNTER — Ambulatory Visit (INDEPENDENT_AMBULATORY_CARE_PROVIDER_SITE_OTHER): Payer: Medicare HMO | Admitting: Family Medicine

## 2017-07-25 ENCOUNTER — Encounter: Payer: Self-pay | Admitting: Family Medicine

## 2017-07-25 ENCOUNTER — Telehealth: Payer: Self-pay | Admitting: Family Medicine

## 2017-07-25 VITALS — BP 112/64 | HR 44 | Temp 98.7°F | Ht 66.0 in | Wt 118.0 lb

## 2017-07-25 DIAGNOSIS — F0390 Unspecified dementia without behavioral disturbance: Secondary | ICD-10-CM

## 2017-07-25 DIAGNOSIS — H547 Unspecified visual loss: Secondary | ICD-10-CM

## 2017-07-25 DIAGNOSIS — F411 Generalized anxiety disorder: Secondary | ICD-10-CM

## 2017-07-25 NOTE — Assessment & Plan Note (Addendum)
MoCA-BLIND score of 6/22 which is equivalent to 8/30 with dependence on IADLs consistent with moderate Dementia. She also has significant anxiety which likely contributed to her low score. Would recommend trial of Celexa or Buspar. If not responsive, consider low dose Quetiapine. Will also refer patient to geriatric psychiatrist, Dr. Donell BeersPlovsky. Would benefit from a stable environment since she her primary caregiver lives 30 minutes away. Completed FL2 Form for assisted living.  ======================================================================================================  FL2 form completed for patient's son to seek Assisted Living Facility placement -Referral to Geriatric Psychiatry, Dr Donell BeersPlovsky, to assist with control of Mood Disorder.  Options include restart of SSRI/SNRI. Mood stablizing medication, e.g. Depakote, Tegretol or Lamictal. If atypical antipsychotic is considered, would try Quetiapine as it has some evidence in treatment of GAD.  -Follow up if patient attended [facility] and would she consider counseling through there.  - Repeat cognitive testing, likely neuropsychologic testing with Dr Wonda CeriseBailer-Heath with [facility], once Mood Disorder(s) under better control. / T. McDiarmid, MD

## 2017-07-25 NOTE — Telephone Encounter (Signed)
Nicollett from [facility] is requesting verbal orders for the patient. She would like 1 time for 1 week and 2 times for week 2 for PT. They are also requesting Skilled nursing for 1 time week 1 and 2 times week 2. Please call Nicollette at [phone]  jw

## 2017-07-25 NOTE — Telephone Encounter (Signed)
Son is trying to get pt into assisted living. Son was told he will need an FL2. Pt is coming in today with daughter to see Dr. McDiarmid and would like to have one filled out while here. ep
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
## 2017-07-26 ENCOUNTER — Encounter: Payer: Self-pay | Admitting: Family Medicine
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
## 2017-07-26 NOTE — Telephone Encounter (Signed)
Verbal orders given.  Please let me know if I have to do anything else from my end.

## 2017-07-29 ENCOUNTER — Telehealth (HOSPITAL_COMMUNITY): Payer: Self-pay

## 2017-07-30 DIAGNOSIS — F4310 Post-traumatic stress disorder, unspecified: Secondary | ICD-10-CM | POA: Diagnosis not present

## 2017-07-30 DIAGNOSIS — I10 Essential (primary) hypertension: Secondary | ICD-10-CM | POA: Diagnosis not present

## 2017-07-30 DIAGNOSIS — E785 Hyperlipidemia, unspecified: Secondary | ICD-10-CM | POA: Diagnosis not present

## 2017-07-30 DIAGNOSIS — F0390 Unspecified dementia without behavioral disturbance: Secondary | ICD-10-CM | POA: Diagnosis not present

## 2017-07-30 DIAGNOSIS — S022XXD Fracture of nasal bones, subsequent encounter for fracture with routine healing: Secondary | ICD-10-CM | POA: Diagnosis not present

## 2017-07-30 DIAGNOSIS — F411 Generalized anxiety disorder: Secondary | ICD-10-CM | POA: Diagnosis not present

## 2017-07-30 DIAGNOSIS — S52502D Unspecified fracture of the lower end of left radius, subsequent encounter for closed fracture with routine healing: Secondary | ICD-10-CM | POA: Diagnosis not present

## 2017-07-30 DIAGNOSIS — D649 Anemia, unspecified: Secondary | ICD-10-CM | POA: Diagnosis not present

## 2017-07-30 DIAGNOSIS — Z87440 Personal history of urinary (tract) infections: Secondary | ICD-10-CM | POA: Diagnosis not present

## 2017-07-30 NOTE — Telephone Encounter (Signed)
Verbal orders for PT twice weekly for 2 weeks given.

## 2017-08-02 DIAGNOSIS — R2689 Other abnormalities of gait and mobility: Secondary | ICD-10-CM | POA: Diagnosis not present

## 2017-08-02 DIAGNOSIS — M6281 Muscle weakness (generalized): Secondary | ICD-10-CM | POA: Diagnosis not present

## 2017-08-09 DIAGNOSIS — E785 Hyperlipidemia, unspecified: Secondary | ICD-10-CM | POA: Diagnosis not present

## 2017-08-09 DIAGNOSIS — F0390 Unspecified dementia without behavioral disturbance: Secondary | ICD-10-CM | POA: Diagnosis not present

## 2017-08-09 DIAGNOSIS — R296 Repeated falls: Secondary | ICD-10-CM | POA: Diagnosis not present

## 2017-08-09 DIAGNOSIS — R2681 Unsteadiness on feet: Secondary | ICD-10-CM | POA: Diagnosis not present

## 2017-08-09 DIAGNOSIS — G459 Transient cerebral ischemic attack, unspecified: Secondary | ICD-10-CM | POA: Diagnosis not present

## 2017-08-13 DIAGNOSIS — M6281 Muscle weakness (generalized): Secondary | ICD-10-CM | POA: Diagnosis not present

## 2017-08-13 DIAGNOSIS — R278 Other lack of coordination: Secondary | ICD-10-CM | POA: Diagnosis not present

## 2017-08-13 DIAGNOSIS — F322 Major depressive disorder, single episode, severe without psychotic features: Secondary | ICD-10-CM | POA: Diagnosis not present

## 2017-08-13 DIAGNOSIS — R262 Difficulty in walking, not elsewhere classified: Secondary | ICD-10-CM | POA: Diagnosis not present

## 2017-08-13 DIAGNOSIS — I10 Essential (primary) hypertension: Secondary | ICD-10-CM | POA: Diagnosis not present

## 2017-08-13 DIAGNOSIS — F411 Generalized anxiety disorder: Secondary | ICD-10-CM | POA: Diagnosis not present

## 2017-08-13 DIAGNOSIS — S065X0S Traumatic subdural hemorrhage without loss of consciousness, sequela: Secondary | ICD-10-CM | POA: Diagnosis not present

## 2017-08-13 DIAGNOSIS — Z9181 History of falling: Secondary | ICD-10-CM | POA: Diagnosis not present

## 2017-08-14 DIAGNOSIS — E785 Hyperlipidemia, unspecified: Secondary | ICD-10-CM | POA: Diagnosis not present

## 2017-08-14 DIAGNOSIS — G459 Transient cerebral ischemic attack, unspecified: Secondary | ICD-10-CM | POA: Diagnosis not present

## 2017-08-14 DIAGNOSIS — F0390 Unspecified dementia without behavioral disturbance: Secondary | ICD-10-CM | POA: Diagnosis not present

## 2017-08-15 DIAGNOSIS — R262 Difficulty in walking, not elsewhere classified: Secondary | ICD-10-CM | POA: Diagnosis not present

## 2017-08-15 DIAGNOSIS — R278 Other lack of coordination: Secondary | ICD-10-CM | POA: Diagnosis not present

## 2017-08-15 DIAGNOSIS — M6281 Muscle weakness (generalized): Secondary | ICD-10-CM | POA: Diagnosis not present

## 2017-08-15 DIAGNOSIS — I10 Essential (primary) hypertension: Secondary | ICD-10-CM | POA: Diagnosis not present

## 2017-08-15 DIAGNOSIS — S065X0S Traumatic subdural hemorrhage without loss of consciousness, sequela: Secondary | ICD-10-CM | POA: Diagnosis not present

## 2017-08-15 DIAGNOSIS — Z9181 History of falling: Secondary | ICD-10-CM | POA: Diagnosis not present

## 2017-08-20 DIAGNOSIS — F411 Generalized anxiety disorder: Secondary | ICD-10-CM | POA: Diagnosis not present

## 2017-08-20 DIAGNOSIS — F322 Major depressive disorder, single episode, severe without psychotic features: Secondary | ICD-10-CM | POA: Diagnosis not present

## 2017-08-20 DIAGNOSIS — R2681 Unsteadiness on feet: Secondary | ICD-10-CM | POA: Diagnosis not present

## 2017-08-20 DIAGNOSIS — H547 Unspecified visual loss: Secondary | ICD-10-CM | POA: Diagnosis not present

## 2017-08-20 DIAGNOSIS — Z8673 Personal history of transient ischemic attack (TIA), and cerebral infarction without residual deficits: Secondary | ICD-10-CM | POA: Diagnosis not present

## 2017-08-20 DIAGNOSIS — F0390 Unspecified dementia without behavioral disturbance: Secondary | ICD-10-CM | POA: Diagnosis not present

## 2017-08-20 DIAGNOSIS — F419 Anxiety disorder, unspecified: Secondary | ICD-10-CM | POA: Diagnosis not present

## 2017-08-20 DIAGNOSIS — Z9181 History of falling: Secondary | ICD-10-CM | POA: Diagnosis not present

## 2017-08-20 DIAGNOSIS — E440 Moderate protein-calorie malnutrition: Secondary | ICD-10-CM | POA: Diagnosis not present

## 2017-08-20 DIAGNOSIS — Z8781 Personal history of (healed) traumatic fracture: Secondary | ICD-10-CM | POA: Diagnosis not present

## 2017-08-22 DIAGNOSIS — H547 Unspecified visual loss: Secondary | ICD-10-CM | POA: Diagnosis not present

## 2017-08-22 DIAGNOSIS — F419 Anxiety disorder, unspecified: Secondary | ICD-10-CM | POA: Diagnosis not present

## 2017-08-22 DIAGNOSIS — Z8673 Personal history of transient ischemic attack (TIA), and cerebral infarction without residual deficits: Secondary | ICD-10-CM | POA: Diagnosis not present

## 2017-08-22 DIAGNOSIS — Z9181 History of falling: Secondary | ICD-10-CM | POA: Diagnosis not present

## 2017-08-22 DIAGNOSIS — F0390 Unspecified dementia without behavioral disturbance: Secondary | ICD-10-CM | POA: Diagnosis not present

## 2017-08-22 DIAGNOSIS — R2681 Unsteadiness on feet: Secondary | ICD-10-CM | POA: Diagnosis not present

## 2017-08-22 DIAGNOSIS — Z8781 Personal history of (healed) traumatic fracture: Secondary | ICD-10-CM | POA: Diagnosis not present

## 2017-08-22 DIAGNOSIS — E440 Moderate protein-calorie malnutrition: Secondary | ICD-10-CM | POA: Diagnosis not present

## 2017-08-23 DIAGNOSIS — Z9181 History of falling: Secondary | ICD-10-CM | POA: Diagnosis not present

## 2017-08-23 DIAGNOSIS — E440 Moderate protein-calorie malnutrition: Secondary | ICD-10-CM | POA: Diagnosis not present

## 2017-08-23 DIAGNOSIS — F419 Anxiety disorder, unspecified: Secondary | ICD-10-CM | POA: Diagnosis not present

## 2017-08-23 DIAGNOSIS — F0390 Unspecified dementia without behavioral disturbance: Secondary | ICD-10-CM | POA: Diagnosis not present

## 2017-08-23 DIAGNOSIS — H547 Unspecified visual loss: Secondary | ICD-10-CM | POA: Diagnosis not present

## 2017-08-23 DIAGNOSIS — Z8673 Personal history of transient ischemic attack (TIA), and cerebral infarction without residual deficits: Secondary | ICD-10-CM | POA: Diagnosis not present

## 2017-08-23 DIAGNOSIS — R2681 Unsteadiness on feet: Secondary | ICD-10-CM | POA: Diagnosis not present

## 2017-08-23 DIAGNOSIS — Z8781 Personal history of (healed) traumatic fracture: Secondary | ICD-10-CM | POA: Diagnosis not present

## 2017-08-25 DIAGNOSIS — F0390 Unspecified dementia without behavioral disturbance: Secondary | ICD-10-CM | POA: Diagnosis not present

## 2017-08-26 DIAGNOSIS — Z8673 Personal history of transient ischemic attack (TIA), and cerebral infarction without residual deficits: Secondary | ICD-10-CM | POA: Diagnosis not present

## 2017-08-26 DIAGNOSIS — Z8781 Personal history of (healed) traumatic fracture: Secondary | ICD-10-CM | POA: Diagnosis not present

## 2017-08-26 DIAGNOSIS — E440 Moderate protein-calorie malnutrition: Secondary | ICD-10-CM | POA: Diagnosis not present

## 2017-08-26 DIAGNOSIS — F0390 Unspecified dementia without behavioral disturbance: Secondary | ICD-10-CM | POA: Diagnosis not present

## 2017-08-26 DIAGNOSIS — H547 Unspecified visual loss: Secondary | ICD-10-CM | POA: Diagnosis not present

## 2017-08-26 DIAGNOSIS — F419 Anxiety disorder, unspecified: Secondary | ICD-10-CM | POA: Diagnosis not present

## 2017-08-26 DIAGNOSIS — R2681 Unsteadiness on feet: Secondary | ICD-10-CM | POA: Diagnosis not present

## 2017-08-26 DIAGNOSIS — Z9181 History of falling: Secondary | ICD-10-CM | POA: Diagnosis not present

## 2017-08-27 DIAGNOSIS — H547 Unspecified visual loss: Secondary | ICD-10-CM | POA: Diagnosis not present

## 2017-08-27 DIAGNOSIS — Z9181 History of falling: Secondary | ICD-10-CM | POA: Diagnosis not present

## 2017-08-27 DIAGNOSIS — F322 Major depressive disorder, single episode, severe without psychotic features: Secondary | ICD-10-CM | POA: Diagnosis not present

## 2017-08-27 DIAGNOSIS — F0390 Unspecified dementia without behavioral disturbance: Secondary | ICD-10-CM | POA: Diagnosis not present

## 2017-08-27 DIAGNOSIS — F419 Anxiety disorder, unspecified: Secondary | ICD-10-CM | POA: Diagnosis not present

## 2017-08-27 DIAGNOSIS — Z8673 Personal history of transient ischemic attack (TIA), and cerebral infarction without residual deficits: Secondary | ICD-10-CM | POA: Diagnosis not present

## 2017-08-27 DIAGNOSIS — F411 Generalized anxiety disorder: Secondary | ICD-10-CM | POA: Diagnosis not present

## 2017-08-27 DIAGNOSIS — E440 Moderate protein-calorie malnutrition: Secondary | ICD-10-CM | POA: Diagnosis not present

## 2017-08-27 DIAGNOSIS — R2681 Unsteadiness on feet: Secondary | ICD-10-CM | POA: Diagnosis not present

## 2017-08-27 DIAGNOSIS — Z8781 Personal history of (healed) traumatic fracture: Secondary | ICD-10-CM | POA: Diagnosis not present

## 2017-08-28 DIAGNOSIS — R2681 Unsteadiness on feet: Secondary | ICD-10-CM | POA: Diagnosis not present

## 2017-08-28 DIAGNOSIS — Z9181 History of falling: Secondary | ICD-10-CM | POA: Diagnosis not present

## 2017-08-28 DIAGNOSIS — F419 Anxiety disorder, unspecified: Secondary | ICD-10-CM | POA: Diagnosis not present

## 2017-08-28 DIAGNOSIS — F0390 Unspecified dementia without behavioral disturbance: Secondary | ICD-10-CM | POA: Diagnosis not present

## 2017-08-28 DIAGNOSIS — H547 Unspecified visual loss: Secondary | ICD-10-CM | POA: Diagnosis not present

## 2017-08-28 DIAGNOSIS — Z8781 Personal history of (healed) traumatic fracture: Secondary | ICD-10-CM | POA: Diagnosis not present

## 2017-08-28 DIAGNOSIS — Z8673 Personal history of transient ischemic attack (TIA), and cerebral infarction without residual deficits: Secondary | ICD-10-CM | POA: Diagnosis not present

## 2017-08-28 DIAGNOSIS — E440 Moderate protein-calorie malnutrition: Secondary | ICD-10-CM | POA: Diagnosis not present

## 2017-08-29 DIAGNOSIS — Z9181 History of falling: Secondary | ICD-10-CM | POA: Diagnosis not present

## 2017-08-29 DIAGNOSIS — F419 Anxiety disorder, unspecified: Secondary | ICD-10-CM | POA: Diagnosis not present

## 2017-08-29 DIAGNOSIS — F0390 Unspecified dementia without behavioral disturbance: Secondary | ICD-10-CM | POA: Diagnosis not present

## 2017-08-29 DIAGNOSIS — Z8781 Personal history of (healed) traumatic fracture: Secondary | ICD-10-CM | POA: Diagnosis not present

## 2017-08-29 DIAGNOSIS — H547 Unspecified visual loss: Secondary | ICD-10-CM | POA: Diagnosis not present

## 2017-08-29 DIAGNOSIS — R2681 Unsteadiness on feet: Secondary | ICD-10-CM | POA: Diagnosis not present

## 2017-08-29 DIAGNOSIS — E440 Moderate protein-calorie malnutrition: Secondary | ICD-10-CM | POA: Diagnosis not present

## 2017-08-29 DIAGNOSIS — Z8673 Personal history of transient ischemic attack (TIA), and cerebral infarction without residual deficits: Secondary | ICD-10-CM | POA: Diagnosis not present

## 2017-08-30 DIAGNOSIS — F0390 Unspecified dementia without behavioral disturbance: Secondary | ICD-10-CM | POA: Diagnosis not present

## 2017-08-31 DIAGNOSIS — F0390 Unspecified dementia without behavioral disturbance: Secondary | ICD-10-CM | POA: Diagnosis not present

## 2017-09-01 DIAGNOSIS — F0390 Unspecified dementia without behavioral disturbance: Secondary | ICD-10-CM | POA: Diagnosis not present

## 2017-09-02 DIAGNOSIS — L84 Corns and callosities: Secondary | ICD-10-CM | POA: Diagnosis not present

## 2017-09-02 DIAGNOSIS — H547 Unspecified visual loss: Secondary | ICD-10-CM | POA: Diagnosis not present

## 2017-09-02 DIAGNOSIS — Z8781 Personal history of (healed) traumatic fracture: Secondary | ICD-10-CM | POA: Diagnosis not present

## 2017-09-02 DIAGNOSIS — I739 Peripheral vascular disease, unspecified: Secondary | ICD-10-CM | POA: Diagnosis not present

## 2017-09-02 DIAGNOSIS — L853 Xerosis cutis: Secondary | ICD-10-CM | POA: Diagnosis not present

## 2017-09-02 DIAGNOSIS — F419 Anxiety disorder, unspecified: Secondary | ICD-10-CM | POA: Diagnosis not present

## 2017-09-02 DIAGNOSIS — Z9181 History of falling: Secondary | ICD-10-CM | POA: Diagnosis not present

## 2017-09-02 DIAGNOSIS — E440 Moderate protein-calorie malnutrition: Secondary | ICD-10-CM | POA: Diagnosis not present

## 2017-09-02 DIAGNOSIS — B351 Tinea unguium: Secondary | ICD-10-CM | POA: Diagnosis not present

## 2017-09-02 DIAGNOSIS — R2681 Unsteadiness on feet: Secondary | ICD-10-CM | POA: Diagnosis not present

## 2017-09-02 DIAGNOSIS — Q845 Enlarged and hypertrophic nails: Secondary | ICD-10-CM | POA: Diagnosis not present

## 2017-09-02 DIAGNOSIS — F0390 Unspecified dementia without behavioral disturbance: Secondary | ICD-10-CM | POA: Diagnosis not present

## 2017-09-02 DIAGNOSIS — L603 Nail dystrophy: Secondary | ICD-10-CM | POA: Diagnosis not present

## 2017-09-02 DIAGNOSIS — M2020 Hallux rigidus, unspecified foot: Secondary | ICD-10-CM | POA: Diagnosis not present

## 2017-09-02 DIAGNOSIS — Z8673 Personal history of transient ischemic attack (TIA), and cerebral infarction without residual deficits: Secondary | ICD-10-CM | POA: Diagnosis not present

## 2017-09-03 DIAGNOSIS — F0390 Unspecified dementia without behavioral disturbance: Secondary | ICD-10-CM | POA: Diagnosis not present

## 2017-09-04 DIAGNOSIS — F419 Anxiety disorder, unspecified: Secondary | ICD-10-CM | POA: Diagnosis not present

## 2017-09-04 DIAGNOSIS — Z8781 Personal history of (healed) traumatic fracture: Secondary | ICD-10-CM | POA: Diagnosis not present

## 2017-09-04 DIAGNOSIS — R2681 Unsteadiness on feet: Secondary | ICD-10-CM | POA: Diagnosis not present

## 2017-09-04 DIAGNOSIS — Z8673 Personal history of transient ischemic attack (TIA), and cerebral infarction without residual deficits: Secondary | ICD-10-CM | POA: Diagnosis not present

## 2017-09-04 DIAGNOSIS — H547 Unspecified visual loss: Secondary | ICD-10-CM | POA: Diagnosis not present

## 2017-09-04 DIAGNOSIS — F0390 Unspecified dementia without behavioral disturbance: Secondary | ICD-10-CM | POA: Diagnosis not present

## 2017-09-04 DIAGNOSIS — E440 Moderate protein-calorie malnutrition: Secondary | ICD-10-CM | POA: Diagnosis not present

## 2017-09-04 DIAGNOSIS — Z9181 History of falling: Secondary | ICD-10-CM | POA: Diagnosis not present

## 2017-09-05 ENCOUNTER — Ambulatory Visit: Payer: Self-pay

## 2017-09-05 DIAGNOSIS — F0390 Unspecified dementia without behavioral disturbance: Secondary | ICD-10-CM | POA: Diagnosis not present

## 2017-09-06 DIAGNOSIS — F0390 Unspecified dementia without behavioral disturbance: Secondary | ICD-10-CM | POA: Diagnosis not present

## 2017-09-06 DIAGNOSIS — H547 Unspecified visual loss: Secondary | ICD-10-CM | POA: Diagnosis not present

## 2017-09-06 DIAGNOSIS — F419 Anxiety disorder, unspecified: Secondary | ICD-10-CM | POA: Diagnosis not present

## 2017-09-06 DIAGNOSIS — R2681 Unsteadiness on feet: Secondary | ICD-10-CM | POA: Diagnosis not present

## 2017-09-06 DIAGNOSIS — E440 Moderate protein-calorie malnutrition: Secondary | ICD-10-CM | POA: Diagnosis not present

## 2017-09-06 DIAGNOSIS — Z8781 Personal history of (healed) traumatic fracture: Secondary | ICD-10-CM | POA: Diagnosis not present

## 2017-09-06 DIAGNOSIS — Z9181 History of falling: Secondary | ICD-10-CM | POA: Diagnosis not present

## 2017-09-06 DIAGNOSIS — Z8673 Personal history of transient ischemic attack (TIA), and cerebral infarction without residual deficits: Secondary | ICD-10-CM | POA: Diagnosis not present

## 2017-09-07 DIAGNOSIS — F0390 Unspecified dementia without behavioral disturbance: Secondary | ICD-10-CM | POA: Diagnosis not present

## 2017-09-08 DIAGNOSIS — F0390 Unspecified dementia without behavioral disturbance: Secondary | ICD-10-CM | POA: Diagnosis not present

## 2017-09-09 DIAGNOSIS — Z8781 Personal history of (healed) traumatic fracture: Secondary | ICD-10-CM | POA: Diagnosis not present

## 2017-09-09 DIAGNOSIS — Z8673 Personal history of transient ischemic attack (TIA), and cerebral infarction without residual deficits: Secondary | ICD-10-CM | POA: Diagnosis not present

## 2017-09-09 DIAGNOSIS — R2681 Unsteadiness on feet: Secondary | ICD-10-CM | POA: Diagnosis not present

## 2017-09-09 DIAGNOSIS — E440 Moderate protein-calorie malnutrition: Secondary | ICD-10-CM | POA: Diagnosis not present

## 2017-09-09 DIAGNOSIS — Z9181 History of falling: Secondary | ICD-10-CM | POA: Diagnosis not present

## 2017-09-09 DIAGNOSIS — F419 Anxiety disorder, unspecified: Secondary | ICD-10-CM | POA: Diagnosis not present

## 2017-09-09 DIAGNOSIS — F0390 Unspecified dementia without behavioral disturbance: Secondary | ICD-10-CM | POA: Diagnosis not present

## 2017-09-09 DIAGNOSIS — H547 Unspecified visual loss: Secondary | ICD-10-CM | POA: Diagnosis not present

## 2017-09-10 DIAGNOSIS — E440 Moderate protein-calorie malnutrition: Secondary | ICD-10-CM | POA: Diagnosis not present

## 2017-09-10 DIAGNOSIS — R2681 Unsteadiness on feet: Secondary | ICD-10-CM | POA: Diagnosis not present

## 2017-09-10 DIAGNOSIS — Z8673 Personal history of transient ischemic attack (TIA), and cerebral infarction without residual deficits: Secondary | ICD-10-CM | POA: Diagnosis not present

## 2017-09-10 DIAGNOSIS — H547 Unspecified visual loss: Secondary | ICD-10-CM | POA: Diagnosis not present

## 2017-09-10 DIAGNOSIS — Z8781 Personal history of (healed) traumatic fracture: Secondary | ICD-10-CM | POA: Diagnosis not present

## 2017-09-10 DIAGNOSIS — Z9181 History of falling: Secondary | ICD-10-CM | POA: Diagnosis not present

## 2017-09-10 DIAGNOSIS — F0390 Unspecified dementia without behavioral disturbance: Secondary | ICD-10-CM | POA: Diagnosis not present

## 2017-09-10 DIAGNOSIS — F419 Anxiety disorder, unspecified: Secondary | ICD-10-CM | POA: Diagnosis not present

## 2017-09-11 DIAGNOSIS — E440 Moderate protein-calorie malnutrition: Secondary | ICD-10-CM | POA: Diagnosis not present

## 2017-09-11 DIAGNOSIS — F419 Anxiety disorder, unspecified: Secondary | ICD-10-CM | POA: Diagnosis not present

## 2017-09-11 DIAGNOSIS — F0390 Unspecified dementia without behavioral disturbance: Secondary | ICD-10-CM | POA: Diagnosis not present

## 2017-09-11 DIAGNOSIS — Z8673 Personal history of transient ischemic attack (TIA), and cerebral infarction without residual deficits: Secondary | ICD-10-CM | POA: Diagnosis not present

## 2017-09-11 DIAGNOSIS — Z8781 Personal history of (healed) traumatic fracture: Secondary | ICD-10-CM | POA: Diagnosis not present

## 2017-09-11 DIAGNOSIS — H547 Unspecified visual loss: Secondary | ICD-10-CM | POA: Diagnosis not present

## 2017-09-11 DIAGNOSIS — Z9181 History of falling: Secondary | ICD-10-CM | POA: Diagnosis not present

## 2017-09-11 DIAGNOSIS — R2681 Unsteadiness on feet: Secondary | ICD-10-CM | POA: Diagnosis not present

## 2017-09-12 DIAGNOSIS — Z8781 Personal history of (healed) traumatic fracture: Secondary | ICD-10-CM | POA: Diagnosis not present

## 2017-09-12 DIAGNOSIS — E440 Moderate protein-calorie malnutrition: Secondary | ICD-10-CM | POA: Diagnosis not present

## 2017-09-12 DIAGNOSIS — F419 Anxiety disorder, unspecified: Secondary | ICD-10-CM | POA: Diagnosis not present

## 2017-09-12 DIAGNOSIS — R2681 Unsteadiness on feet: Secondary | ICD-10-CM | POA: Diagnosis not present

## 2017-09-12 DIAGNOSIS — Z9181 History of falling: Secondary | ICD-10-CM | POA: Diagnosis not present

## 2017-09-12 DIAGNOSIS — F0390 Unspecified dementia without behavioral disturbance: Secondary | ICD-10-CM | POA: Diagnosis not present

## 2017-09-12 DIAGNOSIS — Z8673 Personal history of transient ischemic attack (TIA), and cerebral infarction without residual deficits: Secondary | ICD-10-CM | POA: Diagnosis not present

## 2017-09-12 DIAGNOSIS — H547 Unspecified visual loss: Secondary | ICD-10-CM | POA: Diagnosis not present

## 2017-09-13 DIAGNOSIS — F0390 Unspecified dementia without behavioral disturbance: Secondary | ICD-10-CM | POA: Diagnosis not present

## 2017-09-14 DIAGNOSIS — F0390 Unspecified dementia without behavioral disturbance: Secondary | ICD-10-CM | POA: Diagnosis not present

## 2017-09-15 DIAGNOSIS — F0390 Unspecified dementia without behavioral disturbance: Secondary | ICD-10-CM | POA: Diagnosis not present

## 2017-09-16 DIAGNOSIS — F0390 Unspecified dementia without behavioral disturbance: Secondary | ICD-10-CM | POA: Diagnosis not present

## 2017-09-16 DIAGNOSIS — E440 Moderate protein-calorie malnutrition: Secondary | ICD-10-CM | POA: Diagnosis not present

## 2017-09-16 DIAGNOSIS — Z9181 History of falling: Secondary | ICD-10-CM | POA: Diagnosis not present

## 2017-09-16 DIAGNOSIS — F419 Anxiety disorder, unspecified: Secondary | ICD-10-CM | POA: Diagnosis not present

## 2017-09-16 DIAGNOSIS — R2681 Unsteadiness on feet: Secondary | ICD-10-CM | POA: Diagnosis not present

## 2017-09-16 DIAGNOSIS — H547 Unspecified visual loss: Secondary | ICD-10-CM | POA: Diagnosis not present

## 2017-09-16 DIAGNOSIS — Z8673 Personal history of transient ischemic attack (TIA), and cerebral infarction without residual deficits: Secondary | ICD-10-CM | POA: Diagnosis not present

## 2017-09-16 DIAGNOSIS — Z8781 Personal history of (healed) traumatic fracture: Secondary | ICD-10-CM | POA: Diagnosis not present

## 2017-09-17 DIAGNOSIS — F322 Major depressive disorder, single episode, severe without psychotic features: Secondary | ICD-10-CM | POA: Diagnosis not present

## 2017-09-17 DIAGNOSIS — F411 Generalized anxiety disorder: Secondary | ICD-10-CM | POA: Diagnosis not present

## 2017-09-17 DIAGNOSIS — F0390 Unspecified dementia without behavioral disturbance: Secondary | ICD-10-CM | POA: Diagnosis not present

## 2017-09-18 DIAGNOSIS — F419 Anxiety disorder, unspecified: Secondary | ICD-10-CM | POA: Diagnosis not present

## 2017-09-18 DIAGNOSIS — E440 Moderate protein-calorie malnutrition: Secondary | ICD-10-CM | POA: Diagnosis not present

## 2017-09-18 DIAGNOSIS — F0390 Unspecified dementia without behavioral disturbance: Secondary | ICD-10-CM | POA: Diagnosis not present

## 2017-09-18 DIAGNOSIS — H547 Unspecified visual loss: Secondary | ICD-10-CM | POA: Diagnosis not present

## 2017-09-18 DIAGNOSIS — R2681 Unsteadiness on feet: Secondary | ICD-10-CM | POA: Diagnosis not present

## 2017-09-18 DIAGNOSIS — Z8673 Personal history of transient ischemic attack (TIA), and cerebral infarction without residual deficits: Secondary | ICD-10-CM | POA: Diagnosis not present

## 2017-09-18 DIAGNOSIS — Z9181 History of falling: Secondary | ICD-10-CM | POA: Diagnosis not present

## 2017-09-18 DIAGNOSIS — Z8781 Personal history of (healed) traumatic fracture: Secondary | ICD-10-CM | POA: Diagnosis not present

## 2017-09-19 DIAGNOSIS — F419 Anxiety disorder, unspecified: Secondary | ICD-10-CM | POA: Diagnosis not present

## 2017-09-19 DIAGNOSIS — F0390 Unspecified dementia without behavioral disturbance: Secondary | ICD-10-CM | POA: Diagnosis not present

## 2017-09-20 DIAGNOSIS — F0390 Unspecified dementia without behavioral disturbance: Secondary | ICD-10-CM | POA: Diagnosis not present

## 2017-09-21 DIAGNOSIS — F0390 Unspecified dementia without behavioral disturbance: Secondary | ICD-10-CM | POA: Diagnosis not present

## 2017-09-22 DIAGNOSIS — F0390 Unspecified dementia without behavioral disturbance: Secondary | ICD-10-CM | POA: Diagnosis not present

## 2017-09-23 DIAGNOSIS — F0390 Unspecified dementia without behavioral disturbance: Secondary | ICD-10-CM | POA: Diagnosis not present

## 2017-09-24 DIAGNOSIS — F0390 Unspecified dementia without behavioral disturbance: Secondary | ICD-10-CM | POA: Diagnosis not present

## 2017-09-25 DIAGNOSIS — F0390 Unspecified dementia without behavioral disturbance: Secondary | ICD-10-CM | POA: Diagnosis not present

## 2017-09-26 DIAGNOSIS — F0390 Unspecified dementia without behavioral disturbance: Secondary | ICD-10-CM | POA: Diagnosis not present

## 2017-09-27 ENCOUNTER — Emergency Department (HOSPITAL_COMMUNITY): Payer: Medicare HMO

## 2017-09-27 ENCOUNTER — Encounter (HOSPITAL_COMMUNITY): Payer: Self-pay

## 2017-09-27 ENCOUNTER — Emergency Department (HOSPITAL_COMMUNITY)
Admission: EM | Admit: 2017-09-27 | Discharge: 2017-09-28 | Disposition: A | Discharge status: Home / Self Care | Payer: Medicare HMO | Attending: Emergency Medicine | Admitting: Emergency Medicine

## 2017-09-27 DIAGNOSIS — R2681 Unsteadiness on feet: Secondary | ICD-10-CM | POA: Diagnosis not present

## 2017-09-27 DIAGNOSIS — S0101XA Laceration without foreign body of scalp, initial encounter: Secondary | ICD-10-CM | POA: Insufficient documentation

## 2017-09-27 DIAGNOSIS — W19XXXA Unspecified fall, initial encounter: Secondary | ICD-10-CM

## 2017-09-27 DIAGNOSIS — Z87891 Personal history of nicotine dependence: Secondary | ICD-10-CM | POA: Insufficient documentation

## 2017-09-27 DIAGNOSIS — S199XXA Unspecified injury of neck, initial encounter: Secondary | ICD-10-CM | POA: Diagnosis not present

## 2017-09-27 DIAGNOSIS — G459 Transient cerebral ischemic attack, unspecified: Secondary | ICD-10-CM | POA: Diagnosis not present

## 2017-09-27 DIAGNOSIS — R040 Epistaxis: Secondary | ICD-10-CM | POA: Insufficient documentation

## 2017-09-27 DIAGNOSIS — Y999 Unspecified external cause status: Secondary | ICD-10-CM | POA: Insufficient documentation

## 2017-09-27 DIAGNOSIS — F0390 Unspecified dementia without behavioral disturbance: Secondary | ICD-10-CM | POA: Diagnosis not present

## 2017-09-27 DIAGNOSIS — I10 Essential (primary) hypertension: Secondary | ICD-10-CM | POA: Diagnosis not present

## 2017-09-27 DIAGNOSIS — R296 Repeated falls: Secondary | ICD-10-CM | POA: Diagnosis not present

## 2017-09-27 DIAGNOSIS — Y9301 Activity, walking, marching and hiking: Secondary | ICD-10-CM | POA: Insufficient documentation

## 2017-09-27 DIAGNOSIS — Z7982 Long term (current) use of aspirin: Secondary | ICD-10-CM | POA: Insufficient documentation

## 2017-09-27 DIAGNOSIS — Z79899 Other long term (current) drug therapy: Secondary | ICD-10-CM | POA: Insufficient documentation

## 2017-09-27 DIAGNOSIS — S0180XA Unspecified open wound of other part of head, initial encounter: Secondary | ICD-10-CM | POA: Diagnosis not present

## 2017-09-27 DIAGNOSIS — W01190A Fall on same level from slipping, tripping and stumbling with subsequent striking against furniture, initial encounter: Secondary | ICD-10-CM | POA: Insufficient documentation

## 2017-09-27 DIAGNOSIS — S098XXA Other specified injuries of head, initial encounter: Secondary | ICD-10-CM | POA: Diagnosis not present

## 2017-09-27 DIAGNOSIS — S0990XA Unspecified injury of head, initial encounter: Secondary | ICD-10-CM | POA: Diagnosis not present

## 2017-09-27 DIAGNOSIS — Y92122 Bedroom in nursing home as the place of occurrence of the external cause: Secondary | ICD-10-CM | POA: Diagnosis not present

## 2017-09-27 DIAGNOSIS — E785 Hyperlipidemia, unspecified: Secondary | ICD-10-CM | POA: Diagnosis not present

## 2017-09-27 MED ORDER — SODIUM CHLORIDE 0.9 % IV BOLUS (SEPSIS): 500.0000 mL | Freq: Once | INTRAVENOUS | Status: DC

## 2017-09-27 NOTE — ED Notes (Signed)
Patient taken to CT.

## 2017-09-27 NOTE — ED Triage Notes (Signed)
Patient here from [facility] for an unwitnessed fall.  Denies any LOC and can recall event.  Large hematoma with bleeding controlled to posterior right skull.  A&Ox4
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
## 2017-09-28 DIAGNOSIS — F0390 Unspecified dementia without behavioral disturbance: Secondary | ICD-10-CM | POA: Diagnosis not present

## 2017-09-28 DIAGNOSIS — S0990XA Unspecified injury of head, initial encounter: Secondary | ICD-10-CM | POA: Diagnosis not present

## 2017-09-28 DIAGNOSIS — S0101XA Laceration without foreign body of scalp, initial encounter: Secondary | ICD-10-CM | POA: Diagnosis not present

## 2017-09-28 DIAGNOSIS — R4182 Altered mental status, unspecified: Secondary | ICD-10-CM | POA: Diagnosis not present

## 2017-09-28 LAB — BASIC METABOLIC PANEL
ANION GAP: 8 (ref 5–15)
BUN: 18 mg/dL (ref 6–20)
CHLORIDE: 106 mmol/L (ref 101–111)
CO2: 24 mmol/L (ref 22–32)
Calcium: 8.8 mg/dL — ABNORMAL LOW (ref 8.9–10.3)
Creatinine, Ser: 0.59 mg/dL (ref 0.44–1.00)
GFR calc non Af Amer: >60 mL/min (ref >60)
Glucose, Bld: 174 mg/dL — ABNORMAL HIGH (ref 65–99)
Potassium: 3.5 mmol/L (ref 3.5–5.1)
SODIUM: 138 mmol/L (ref 135–145)

## 2017-09-28 LAB — URINALYSIS, ROUTINE W REFLEX MICROSCOPIC
Bilirubin Urine: NEGATIVE
GLUCOSE, UA: NEGATIVE mg/dL
KETONES UR: NEGATIVE mg/dL
Nitrite: NEGATIVE
PROTEIN: NEGATIVE mg/dL
Specific Gravity, Urine: 1.015 (ref 1.005–1.030)
pH: 6 (ref 5.0–8.0)

## 2017-09-28 LAB — CBC
HCT: 33.9 % — ABNORMAL LOW (ref 36.0–46.0)
HEMOGLOBIN: 10.6 g/dL — AB (ref 12.0–15.0)
MCH: 25.3 pg — ABNORMAL LOW (ref 26.0–34.0)
MCHC: 31.3 g/dL (ref 30.0–36.0)
MCV: 80.9 fL (ref 78.0–100.0)
Platelets: 176 10*3/uL (ref 150–400)
RBC: 4.19 MIL/uL (ref 3.87–5.11)
RDW: 19.2 % — ABNORMAL HIGH (ref 11.5–15.5)
WBC: 7.2 10*3/uL (ref 4.0–10.5)

## 2017-09-28 MED ORDER — LIDOCAINE-EPINEPHRINE (PF) 2 %-1:200000 IJ SOLN
20.0000 mL | Freq: Once | INTRAMUSCULAR | Status: DC
  Filled 2017-09-28: qty 20

## 2017-09-28 MED ORDER — LORAZEPAM 1 MG PO TABS
0.5000 mg | ORAL_TABLET | Freq: Once | ORAL | Status: AC
  Administered 2017-09-28: 0.5 mg via ORAL
  Filled 2017-09-28: qty 1

## 2017-09-28 NOTE — ED Notes (Signed)
PTAR at bedside 

## 2017-09-28 NOTE — Discharge Instructions (Signed)

## 2017-09-28 NOTE — ED Notes (Signed)
Report called to [facility], [facility] called for transport

## 2017-09-28 NOTE — ED Notes (Addendum)
Nurse will start IV and draw labs. 

## 2017-09-29 DIAGNOSIS — F0390 Unspecified dementia without behavioral disturbance: Secondary | ICD-10-CM | POA: Diagnosis not present

## 2017-09-29 LAB — URINE CULTURE

## 2017-09-30 DIAGNOSIS — F0390 Unspecified dementia without behavioral disturbance: Secondary | ICD-10-CM | POA: Diagnosis not present

## 2017-10-01 DIAGNOSIS — F0390 Unspecified dementia without behavioral disturbance: Secondary | ICD-10-CM | POA: Diagnosis not present

## 2017-10-02 DIAGNOSIS — F0390 Unspecified dementia without behavioral disturbance: Secondary | ICD-10-CM | POA: Diagnosis not present

## 2017-10-03 DIAGNOSIS — F0390 Unspecified dementia without behavioral disturbance: Secondary | ICD-10-CM | POA: Diagnosis not present

## 2017-10-04 DIAGNOSIS — R296 Repeated falls: Secondary | ICD-10-CM | POA: Diagnosis not present

## 2017-10-04 DIAGNOSIS — F0390 Unspecified dementia without behavioral disturbance: Secondary | ICD-10-CM | POA: Diagnosis not present

## 2017-10-05 DIAGNOSIS — F0390 Unspecified dementia without behavioral disturbance: Secondary | ICD-10-CM | POA: Diagnosis not present

## 2017-10-06 DIAGNOSIS — F0390 Unspecified dementia without behavioral disturbance: Secondary | ICD-10-CM | POA: Diagnosis not present

## 2017-10-07 DIAGNOSIS — F0390 Unspecified dementia without behavioral disturbance: Secondary | ICD-10-CM | POA: Diagnosis not present

## 2017-10-08 DIAGNOSIS — F0390 Unspecified dementia without behavioral disturbance: Secondary | ICD-10-CM | POA: Diagnosis not present

## 2017-10-08 DIAGNOSIS — F322 Major depressive disorder, single episode, severe without psychotic features: Secondary | ICD-10-CM | POA: Diagnosis not present

## 2017-10-08 DIAGNOSIS — F411 Generalized anxiety disorder: Secondary | ICD-10-CM | POA: Diagnosis not present

## 2017-10-09 DIAGNOSIS — F0390 Unspecified dementia without behavioral disturbance: Secondary | ICD-10-CM | POA: Diagnosis not present

## 2017-10-10 DIAGNOSIS — F419 Anxiety disorder, unspecified: Secondary | ICD-10-CM | POA: Diagnosis not present

## 2017-10-10 DIAGNOSIS — R159 Full incontinence of feces: Secondary | ICD-10-CM | POA: Diagnosis not present

## 2017-10-10 DIAGNOSIS — R32 Unspecified urinary incontinence: Secondary | ICD-10-CM | POA: Diagnosis not present

## 2017-10-10 DIAGNOSIS — F0390 Unspecified dementia without behavioral disturbance: Secondary | ICD-10-CM | POA: Diagnosis not present

## 2017-10-11 DIAGNOSIS — F0390 Unspecified dementia without behavioral disturbance: Secondary | ICD-10-CM | POA: Diagnosis not present

## 2017-10-12 ENCOUNTER — Emergency Department (HOSPITAL_COMMUNITY)
Admission: EM | Admit: 2017-10-12 | Discharge: 2017-10-13 | Disposition: A | Discharge status: Home / Self Care | Payer: Medicare HMO | Attending: Emergency Medicine | Admitting: Emergency Medicine

## 2017-10-12 ENCOUNTER — Encounter (HOSPITAL_COMMUNITY): Payer: Self-pay | Admitting: Emergency Medicine

## 2017-10-12 ENCOUNTER — Emergency Department (HOSPITAL_COMMUNITY): Payer: Medicare HMO

## 2017-10-12 DIAGNOSIS — Y999 Unspecified external cause status: Secondary | ICD-10-CM | POA: Diagnosis not present

## 2017-10-12 DIAGNOSIS — S022XXA Fracture of nasal bones, initial encounter for closed fracture: Secondary | ICD-10-CM | POA: Diagnosis not present

## 2017-10-12 DIAGNOSIS — W0110XA Fall on same level from slipping, tripping and stumbling with subsequent striking against unspecified object, initial encounter: Secondary | ICD-10-CM | POA: Insufficient documentation

## 2017-10-12 DIAGNOSIS — Y92009 Unspecified place in unspecified non-institutional (private) residence as the place of occurrence of the external cause: Secondary | ICD-10-CM | POA: Diagnosis not present

## 2017-10-12 DIAGNOSIS — I10 Essential (primary) hypertension: Secondary | ICD-10-CM | POA: Insufficient documentation

## 2017-10-12 DIAGNOSIS — Z79899 Other long term (current) drug therapy: Secondary | ICD-10-CM | POA: Diagnosis not present

## 2017-10-12 DIAGNOSIS — F0390 Unspecified dementia without behavioral disturbance: Secondary | ICD-10-CM | POA: Diagnosis not present

## 2017-10-12 DIAGNOSIS — Y9301 Activity, walking, marching and hiking: Secondary | ICD-10-CM | POA: Diagnosis not present

## 2017-10-12 DIAGNOSIS — Z7982 Long term (current) use of aspirin: Secondary | ICD-10-CM | POA: Insufficient documentation

## 2017-10-12 DIAGNOSIS — Z9181 History of falling: Secondary | ICD-10-CM | POA: Diagnosis not present

## 2017-10-12 DIAGNOSIS — W19XXXA Unspecified fall, initial encounter: Secondary | ICD-10-CM

## 2017-10-12 DIAGNOSIS — S0993XA Unspecified injury of face, initial encounter: Secondary | ICD-10-CM | POA: Diagnosis not present

## 2017-10-12 DIAGNOSIS — Z87891 Personal history of nicotine dependence: Secondary | ICD-10-CM | POA: Insufficient documentation

## 2017-10-12 DIAGNOSIS — S0990XA Unspecified injury of head, initial encounter: Secondary | ICD-10-CM | POA: Diagnosis not present

## 2017-10-12 MED ORDER — IBUPROFEN 400 MG PO TABS
ORAL_TABLET | ORAL | Status: AC
  Filled 2017-10-12: qty 1

## 2017-10-12 MED ORDER — IBUPROFEN 400 MG PO TABS
400.0000 mg | ORAL_TABLET | Freq: Once | ORAL | Status: AC | PRN
  Administered 2017-10-12: 400 mg via ORAL

## 2017-10-12 NOTE — ED Triage Notes (Addendum)
[place] [facility] [place] [facility], per family pt tripped over cart while walking, fell fwd hitting R side of face on med tech cart. Bruising and swelling to bridge of nose, R eye with small lac under R eye. Bleeding controlled. Ice pack applied. Pt denies LOC. Denies neck pain, pt had fall in June with R orbital fx. Pt does take ASA 81mg  daily

## 2017-10-13 DIAGNOSIS — F0390 Unspecified dementia without behavioral disturbance: Secondary | ICD-10-CM | POA: Diagnosis not present

## 2017-10-13 NOTE — Discharge Instructions (Signed)
Follow up with your PCP or optho
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
## 2017-10-14 DIAGNOSIS — F0390 Unspecified dementia without behavioral disturbance: Secondary | ICD-10-CM | POA: Diagnosis not present

## 2017-10-15 DIAGNOSIS — F322 Major depressive disorder, single episode, severe without psychotic features: Secondary | ICD-10-CM | POA: Diagnosis not present

## 2017-10-15 DIAGNOSIS — F0390 Unspecified dementia without behavioral disturbance: Secondary | ICD-10-CM | POA: Diagnosis not present

## 2017-10-15 DIAGNOSIS — F411 Generalized anxiety disorder: Secondary | ICD-10-CM | POA: Diagnosis not present

## 2017-10-16 DIAGNOSIS — R2681 Unsteadiness on feet: Secondary | ICD-10-CM | POA: Diagnosis not present

## 2017-10-16 DIAGNOSIS — F0390 Unspecified dementia without behavioral disturbance: Secondary | ICD-10-CM | POA: Diagnosis not present

## 2017-10-16 DIAGNOSIS — R531 Weakness: Secondary | ICD-10-CM | POA: Diagnosis not present

## 2017-10-16 DIAGNOSIS — W19XXXD Unspecified fall, subsequent encounter: Secondary | ICD-10-CM | POA: Diagnosis not present

## 2017-10-16 DIAGNOSIS — Z7982 Long term (current) use of aspirin: Secondary | ICD-10-CM | POA: Diagnosis not present

## 2017-10-16 DIAGNOSIS — S065X0D Traumatic subdural hemorrhage without loss of consciousness, subsequent encounter: Secondary | ICD-10-CM | POA: Diagnosis not present

## 2017-10-16 DIAGNOSIS — H547 Unspecified visual loss: Secondary | ICD-10-CM | POA: Diagnosis not present

## 2017-10-17 DIAGNOSIS — F0390 Unspecified dementia without behavioral disturbance: Secondary | ICD-10-CM | POA: Diagnosis not present

## 2017-10-18 DIAGNOSIS — R2681 Unsteadiness on feet: Secondary | ICD-10-CM | POA: Diagnosis not present

## 2017-10-18 DIAGNOSIS — F0390 Unspecified dementia without behavioral disturbance: Secondary | ICD-10-CM | POA: Diagnosis not present

## 2017-10-18 DIAGNOSIS — R296 Repeated falls: Secondary | ICD-10-CM | POA: Diagnosis not present

## 2017-10-19 DIAGNOSIS — F0390 Unspecified dementia without behavioral disturbance: Secondary | ICD-10-CM | POA: Diagnosis not present

## 2017-10-20 DIAGNOSIS — F0390 Unspecified dementia without behavioral disturbance: Secondary | ICD-10-CM | POA: Diagnosis not present

## 2017-10-21 DIAGNOSIS — F0390 Unspecified dementia without behavioral disturbance: Secondary | ICD-10-CM | POA: Diagnosis not present

## 2017-10-22 DIAGNOSIS — F322 Major depressive disorder, single episode, severe without psychotic features: Secondary | ICD-10-CM | POA: Diagnosis not present

## 2017-10-22 DIAGNOSIS — F0390 Unspecified dementia without behavioral disturbance: Secondary | ICD-10-CM | POA: Diagnosis not present

## 2017-10-22 DIAGNOSIS — F411 Generalized anxiety disorder: Secondary | ICD-10-CM | POA: Diagnosis not present

## 2017-10-23 DIAGNOSIS — H547 Unspecified visual loss: Secondary | ICD-10-CM | POA: Diagnosis not present

## 2017-10-23 DIAGNOSIS — W19XXXD Unspecified fall, subsequent encounter: Secondary | ICD-10-CM | POA: Diagnosis not present

## 2017-10-23 DIAGNOSIS — Z7982 Long term (current) use of aspirin: Secondary | ICD-10-CM | POA: Diagnosis not present

## 2017-10-23 DIAGNOSIS — R531 Weakness: Secondary | ICD-10-CM | POA: Diagnosis not present

## 2017-10-23 DIAGNOSIS — F0390 Unspecified dementia without behavioral disturbance: Secondary | ICD-10-CM | POA: Diagnosis not present

## 2017-10-23 DIAGNOSIS — S065X0D Traumatic subdural hemorrhage without loss of consciousness, subsequent encounter: Secondary | ICD-10-CM | POA: Diagnosis not present

## 2017-10-23 DIAGNOSIS — R2681 Unsteadiness on feet: Secondary | ICD-10-CM | POA: Diagnosis not present

## 2017-10-24 DIAGNOSIS — F0390 Unspecified dementia without behavioral disturbance: Secondary | ICD-10-CM | POA: Diagnosis not present

## 2017-10-25 DIAGNOSIS — Z7982 Long term (current) use of aspirin: Secondary | ICD-10-CM | POA: Diagnosis not present

## 2017-10-25 DIAGNOSIS — R531 Weakness: Secondary | ICD-10-CM | POA: Diagnosis not present

## 2017-10-25 DIAGNOSIS — E785 Hyperlipidemia, unspecified: Secondary | ICD-10-CM | POA: Diagnosis not present

## 2017-10-25 DIAGNOSIS — F0390 Unspecified dementia without behavioral disturbance: Secondary | ICD-10-CM | POA: Diagnosis not present

## 2017-10-25 DIAGNOSIS — R296 Repeated falls: Secondary | ICD-10-CM | POA: Diagnosis not present

## 2017-10-25 DIAGNOSIS — S065X0D Traumatic subdural hemorrhage without loss of consciousness, subsequent encounter: Secondary | ICD-10-CM | POA: Diagnosis not present

## 2017-10-25 DIAGNOSIS — G459 Transient cerebral ischemic attack, unspecified: Secondary | ICD-10-CM | POA: Diagnosis not present

## 2017-10-25 DIAGNOSIS — H547 Unspecified visual loss: Secondary | ICD-10-CM | POA: Diagnosis not present

## 2017-10-25 DIAGNOSIS — W19XXXD Unspecified fall, subsequent encounter: Secondary | ICD-10-CM | POA: Diagnosis not present

## 2017-10-25 DIAGNOSIS — R2681 Unsteadiness on feet: Secondary | ICD-10-CM | POA: Diagnosis not present

## 2017-10-26 DIAGNOSIS — F0390 Unspecified dementia without behavioral disturbance: Secondary | ICD-10-CM | POA: Diagnosis not present

## 2017-10-27 DIAGNOSIS — F0390 Unspecified dementia without behavioral disturbance: Secondary | ICD-10-CM | POA: Diagnosis not present

## 2017-10-28 DIAGNOSIS — F0390 Unspecified dementia without behavioral disturbance: Secondary | ICD-10-CM | POA: Diagnosis not present

## 2017-10-29 DIAGNOSIS — R2681 Unsteadiness on feet: Secondary | ICD-10-CM | POA: Diagnosis not present

## 2017-10-29 DIAGNOSIS — Z7982 Long term (current) use of aspirin: Secondary | ICD-10-CM | POA: Diagnosis not present

## 2017-10-29 DIAGNOSIS — F0390 Unspecified dementia without behavioral disturbance: Secondary | ICD-10-CM | POA: Diagnosis not present

## 2017-10-29 DIAGNOSIS — R531 Weakness: Secondary | ICD-10-CM | POA: Diagnosis not present

## 2017-10-29 DIAGNOSIS — F322 Major depressive disorder, single episode, severe without psychotic features: Secondary | ICD-10-CM | POA: Diagnosis not present

## 2017-10-29 DIAGNOSIS — W19XXXD Unspecified fall, subsequent encounter: Secondary | ICD-10-CM | POA: Diagnosis not present

## 2017-10-29 DIAGNOSIS — H547 Unspecified visual loss: Secondary | ICD-10-CM | POA: Diagnosis not present

## 2017-10-29 DIAGNOSIS — F411 Generalized anxiety disorder: Secondary | ICD-10-CM | POA: Diagnosis not present

## 2017-10-29 DIAGNOSIS — S065X0D Traumatic subdural hemorrhage without loss of consciousness, subsequent encounter: Secondary | ICD-10-CM | POA: Diagnosis not present

## 2017-10-30 DIAGNOSIS — F0390 Unspecified dementia without behavioral disturbance: Secondary | ICD-10-CM | POA: Diagnosis not present

## 2017-10-31 DIAGNOSIS — F0390 Unspecified dementia without behavioral disturbance: Secondary | ICD-10-CM | POA: Diagnosis not present

## 2017-11-01 DIAGNOSIS — H547 Unspecified visual loss: Secondary | ICD-10-CM | POA: Diagnosis not present

## 2017-11-01 DIAGNOSIS — F0150 Vascular dementia without behavioral disturbance: Secondary | ICD-10-CM | POA: Diagnosis not present

## 2017-11-01 DIAGNOSIS — W19XXXD Unspecified fall, subsequent encounter: Secondary | ICD-10-CM | POA: Diagnosis not present

## 2017-11-01 DIAGNOSIS — F064 Anxiety disorder due to known physiological condition: Secondary | ICD-10-CM | POA: Diagnosis not present

## 2017-11-01 DIAGNOSIS — Z7982 Long term (current) use of aspirin: Secondary | ICD-10-CM | POA: Diagnosis not present

## 2017-11-01 DIAGNOSIS — F0390 Unspecified dementia without behavioral disturbance: Secondary | ICD-10-CM | POA: Diagnosis not present

## 2017-11-01 DIAGNOSIS — R531 Weakness: Secondary | ICD-10-CM | POA: Diagnosis not present

## 2017-11-01 DIAGNOSIS — R2681 Unsteadiness on feet: Secondary | ICD-10-CM | POA: Diagnosis not present

## 2017-11-01 DIAGNOSIS — S065X0D Traumatic subdural hemorrhage without loss of consciousness, subsequent encounter: Secondary | ICD-10-CM | POA: Diagnosis not present

## 2017-11-02 DIAGNOSIS — F0390 Unspecified dementia without behavioral disturbance: Secondary | ICD-10-CM | POA: Diagnosis not present

## 2017-11-03 DIAGNOSIS — F0390 Unspecified dementia without behavioral disturbance: Secondary | ICD-10-CM | POA: Diagnosis not present

## 2017-11-04 DIAGNOSIS — R2681 Unsteadiness on feet: Secondary | ICD-10-CM | POA: Diagnosis not present

## 2017-11-04 DIAGNOSIS — Z7982 Long term (current) use of aspirin: Secondary | ICD-10-CM | POA: Diagnosis not present

## 2017-11-04 DIAGNOSIS — W19XXXD Unspecified fall, subsequent encounter: Secondary | ICD-10-CM | POA: Diagnosis not present

## 2017-11-04 DIAGNOSIS — S065X0D Traumatic subdural hemorrhage without loss of consciousness, subsequent encounter: Secondary | ICD-10-CM | POA: Diagnosis not present

## 2017-11-04 DIAGNOSIS — H547 Unspecified visual loss: Secondary | ICD-10-CM | POA: Diagnosis not present

## 2017-11-04 DIAGNOSIS — R531 Weakness: Secondary | ICD-10-CM | POA: Diagnosis not present

## 2017-11-04 DIAGNOSIS — F0390 Unspecified dementia without behavioral disturbance: Secondary | ICD-10-CM | POA: Diagnosis not present

## 2017-11-05 DIAGNOSIS — Z7982 Long term (current) use of aspirin: Secondary | ICD-10-CM | POA: Diagnosis not present

## 2017-11-05 DIAGNOSIS — S065X0D Traumatic subdural hemorrhage without loss of consciousness, subsequent encounter: Secondary | ICD-10-CM | POA: Diagnosis not present

## 2017-11-05 DIAGNOSIS — R531 Weakness: Secondary | ICD-10-CM | POA: Diagnosis not present

## 2017-11-05 DIAGNOSIS — R2681 Unsteadiness on feet: Secondary | ICD-10-CM | POA: Diagnosis not present

## 2017-11-05 DIAGNOSIS — F0390 Unspecified dementia without behavioral disturbance: Secondary | ICD-10-CM | POA: Diagnosis not present

## 2017-11-05 DIAGNOSIS — H547 Unspecified visual loss: Secondary | ICD-10-CM | POA: Diagnosis not present

## 2017-11-05 DIAGNOSIS — W19XXXD Unspecified fall, subsequent encounter: Secondary | ICD-10-CM | POA: Diagnosis not present

## 2017-11-06 DIAGNOSIS — F0390 Unspecified dementia without behavioral disturbance: Secondary | ICD-10-CM | POA: Diagnosis not present

## 2017-11-07 DIAGNOSIS — F0390 Unspecified dementia without behavioral disturbance: Secondary | ICD-10-CM | POA: Diagnosis not present

## 2017-11-08 DIAGNOSIS — L309 Dermatitis, unspecified: Secondary | ICD-10-CM | POA: Diagnosis not present

## 2017-11-08 DIAGNOSIS — F0390 Unspecified dementia without behavioral disturbance: Secondary | ICD-10-CM | POA: Diagnosis not present

## 2017-11-09 DIAGNOSIS — F0390 Unspecified dementia without behavioral disturbance: Secondary | ICD-10-CM | POA: Diagnosis not present

## 2017-11-10 DIAGNOSIS — F0390 Unspecified dementia without behavioral disturbance: Secondary | ICD-10-CM | POA: Diagnosis not present

## 2017-11-11 DIAGNOSIS — F0390 Unspecified dementia without behavioral disturbance: Secondary | ICD-10-CM | POA: Diagnosis not present

## 2017-11-12 DIAGNOSIS — F411 Generalized anxiety disorder: Secondary | ICD-10-CM | POA: Diagnosis not present

## 2017-11-12 DIAGNOSIS — F322 Major depressive disorder, single episode, severe without psychotic features: Secondary | ICD-10-CM | POA: Diagnosis not present

## 2017-11-12 DIAGNOSIS — F0390 Unspecified dementia without behavioral disturbance: Secondary | ICD-10-CM | POA: Diagnosis not present

## 2017-11-13 DIAGNOSIS — R531 Weakness: Secondary | ICD-10-CM | POA: Diagnosis not present

## 2017-11-13 DIAGNOSIS — H547 Unspecified visual loss: Secondary | ICD-10-CM | POA: Diagnosis not present

## 2017-11-13 DIAGNOSIS — W19XXXD Unspecified fall, subsequent encounter: Secondary | ICD-10-CM | POA: Diagnosis not present

## 2017-11-13 DIAGNOSIS — R32 Unspecified urinary incontinence: Secondary | ICD-10-CM | POA: Diagnosis not present

## 2017-11-13 DIAGNOSIS — S065X0D Traumatic subdural hemorrhage without loss of consciousness, subsequent encounter: Secondary | ICD-10-CM | POA: Diagnosis not present

## 2017-11-13 DIAGNOSIS — R2681 Unsteadiness on feet: Secondary | ICD-10-CM | POA: Diagnosis not present

## 2017-11-13 DIAGNOSIS — R159 Full incontinence of feces: Secondary | ICD-10-CM | POA: Diagnosis not present

## 2017-11-13 DIAGNOSIS — Z7982 Long term (current) use of aspirin: Secondary | ICD-10-CM | POA: Diagnosis not present

## 2017-11-13 DIAGNOSIS — F0390 Unspecified dementia without behavioral disturbance: Secondary | ICD-10-CM | POA: Diagnosis not present

## 2017-11-14 DIAGNOSIS — F0390 Unspecified dementia without behavioral disturbance: Secondary | ICD-10-CM | POA: Diagnosis not present

## 2017-11-15 DIAGNOSIS — S065X0D Traumatic subdural hemorrhage without loss of consciousness, subsequent encounter: Secondary | ICD-10-CM | POA: Diagnosis not present

## 2017-11-15 DIAGNOSIS — Z7982 Long term (current) use of aspirin: Secondary | ICD-10-CM | POA: Diagnosis not present

## 2017-11-15 DIAGNOSIS — F0390 Unspecified dementia without behavioral disturbance: Secondary | ICD-10-CM | POA: Diagnosis not present

## 2017-11-15 DIAGNOSIS — R2681 Unsteadiness on feet: Secondary | ICD-10-CM | POA: Diagnosis not present

## 2017-11-15 DIAGNOSIS — H547 Unspecified visual loss: Secondary | ICD-10-CM | POA: Diagnosis not present

## 2017-11-15 DIAGNOSIS — W19XXXD Unspecified fall, subsequent encounter: Secondary | ICD-10-CM | POA: Diagnosis not present

## 2017-11-15 DIAGNOSIS — R531 Weakness: Secondary | ICD-10-CM | POA: Diagnosis not present

## 2017-11-16 DIAGNOSIS — F0390 Unspecified dementia without behavioral disturbance: Secondary | ICD-10-CM | POA: Diagnosis not present

## 2017-11-17 DIAGNOSIS — F0390 Unspecified dementia without behavioral disturbance: Secondary | ICD-10-CM | POA: Diagnosis not present

## 2017-11-18 DIAGNOSIS — F0390 Unspecified dementia without behavioral disturbance: Secondary | ICD-10-CM | POA: Diagnosis not present

## 2017-11-19 DIAGNOSIS — W19XXXD Unspecified fall, subsequent encounter: Secondary | ICD-10-CM | POA: Diagnosis not present

## 2017-11-19 DIAGNOSIS — G459 Transient cerebral ischemic attack, unspecified: Secondary | ICD-10-CM | POA: Diagnosis not present

## 2017-11-19 DIAGNOSIS — Z7982 Long term (current) use of aspirin: Secondary | ICD-10-CM | POA: Diagnosis not present

## 2017-11-19 DIAGNOSIS — F322 Major depressive disorder, single episode, severe without psychotic features: Secondary | ICD-10-CM | POA: Diagnosis not present

## 2017-11-19 DIAGNOSIS — S065X0D Traumatic subdural hemorrhage without loss of consciousness, subsequent encounter: Secondary | ICD-10-CM | POA: Diagnosis not present

## 2017-11-19 DIAGNOSIS — F411 Generalized anxiety disorder: Secondary | ICD-10-CM | POA: Diagnosis not present

## 2017-11-19 DIAGNOSIS — F0390 Unspecified dementia without behavioral disturbance: Secondary | ICD-10-CM | POA: Diagnosis not present

## 2017-11-19 DIAGNOSIS — L309 Dermatitis, unspecified: Secondary | ICD-10-CM | POA: Diagnosis not present

## 2017-11-19 DIAGNOSIS — R296 Repeated falls: Secondary | ICD-10-CM | POA: Diagnosis not present

## 2017-11-19 DIAGNOSIS — R2681 Unsteadiness on feet: Secondary | ICD-10-CM | POA: Diagnosis not present

## 2017-11-19 DIAGNOSIS — R531 Weakness: Secondary | ICD-10-CM | POA: Diagnosis not present

## 2017-11-19 DIAGNOSIS — H547 Unspecified visual loss: Secondary | ICD-10-CM | POA: Diagnosis not present

## 2017-11-20 ENCOUNTER — Emergency Department (HOSPITAL_COMMUNITY)
Admission: EM | Admit: 2017-11-20 | Discharge: 2017-11-20 | Disposition: A | Discharge status: Home / Self Care | Payer: Medicare HMO | Attending: Emergency Medicine | Admitting: Emergency Medicine

## 2017-11-20 ENCOUNTER — Emergency Department (HOSPITAL_COMMUNITY): Payer: Medicare HMO

## 2017-11-20 ENCOUNTER — Encounter (HOSPITAL_COMMUNITY): Payer: Self-pay

## 2017-11-20 DIAGNOSIS — S199XXA Unspecified injury of neck, initial encounter: Secondary | ICD-10-CM | POA: Diagnosis not present

## 2017-11-20 DIAGNOSIS — Y939 Activity, unspecified: Secondary | ICD-10-CM | POA: Diagnosis not present

## 2017-11-20 DIAGNOSIS — I10 Essential (primary) hypertension: Secondary | ICD-10-CM | POA: Insufficient documentation

## 2017-11-20 DIAGNOSIS — Y999 Unspecified external cause status: Secondary | ICD-10-CM | POA: Insufficient documentation

## 2017-11-20 DIAGNOSIS — Z87891 Personal history of nicotine dependence: Secondary | ICD-10-CM | POA: Insufficient documentation

## 2017-11-20 DIAGNOSIS — S0990XA Unspecified injury of head, initial encounter: Secondary | ICD-10-CM | POA: Diagnosis not present

## 2017-11-20 DIAGNOSIS — Z23 Encounter for immunization: Secondary | ICD-10-CM | POA: Insufficient documentation

## 2017-11-20 DIAGNOSIS — W19XXXA Unspecified fall, initial encounter: Secondary | ICD-10-CM | POA: Insufficient documentation

## 2017-11-20 DIAGNOSIS — Z7982 Long term (current) use of aspirin: Secondary | ICD-10-CM | POA: Diagnosis not present

## 2017-11-20 DIAGNOSIS — G8911 Acute pain due to trauma: Secondary | ICD-10-CM | POA: Diagnosis not present

## 2017-11-20 DIAGNOSIS — Y92129 Unspecified place in nursing home as the place of occurrence of the external cause: Secondary | ICD-10-CM | POA: Diagnosis not present

## 2017-11-20 DIAGNOSIS — Z79899 Other long term (current) drug therapy: Secondary | ICD-10-CM | POA: Insufficient documentation

## 2017-11-20 DIAGNOSIS — F0390 Unspecified dementia without behavioral disturbance: Secondary | ICD-10-CM | POA: Diagnosis not present

## 2017-11-20 DIAGNOSIS — S0993XA Unspecified injury of face, initial encounter: Secondary | ICD-10-CM | POA: Diagnosis not present

## 2017-11-20 DIAGNOSIS — S098XXA Other specified injuries of head, initial encounter: Secondary | ICD-10-CM | POA: Diagnosis not present

## 2017-11-20 MED ORDER — TETANUS-DIPHTH-ACELL PERTUSSIS 5-2.5-18.5 LF-MCG/0.5 IM SUSP
0.5000 mL | Freq: Once | INTRAMUSCULAR | Status: AC
  Administered 2017-11-20: 0.5 mL via INTRAMUSCULAR
  Filled 2017-11-20: qty 0.5

## 2017-11-20 MED ORDER — ACETAMINOPHEN 500 MG PO TABS
1000.0000 mg | ORAL_TABLET | Freq: Once | ORAL | Status: AC
  Administered 2017-11-20: 1000 mg via ORAL
  Filled 2017-11-20: qty 2

## 2017-11-20 NOTE — ED Triage Notes (Signed)
Pt coming [facility]. Pt had a unwitnessed fall this morning at 420am. Pt hit her head and had a hematoma of the right eye. Pt has hx of dementia. Pt axo to baseline

## 2017-11-20 NOTE — ED Provider Notes (Signed)
TIME SEEN: 5:28 AM  CHIEF COMPLAINT: Fall  HPI: Patient is a 81 year old female with history of dementia, previous stroke, hypertension, hyperlipidemia who presents from a nursing facility after she had an unwitnessed fall.  Patient has a history of the same.  She complains of headache.  No other complaints at this time.  Reportedly at her neurologic baseline per EMS and nursing home.  ROS: Level 5 caveat for dementia  PAST MEDICAL HISTORY/PAST SURGICAL HISTORY:  Past Medical History:  Diagnosis Date  . Anxiety   . Closed fracture of left distal radius   . Decreased appetite 05/07/2017  . Delirium   . Distal radius fracture, left   . Frailty syndrome in geriatric patient 07/15/2017  . History of posttraumatic stress disorder (PTSD)   . History of prediabetes   . HTN (hypertension)   . Hyperlipidemia   . Impaired memory    Likely dementia  . Laceration of skin of forehead   . Nasal fracture   . Stroke (HCC)   . Thoracic aortic atherosclerosis (HCC) 07/15/2017  . UTI (urinary tract infection)     MEDICATIONS:  Prior to Admission medications   Medication Sig Start Date End Date Taking? Authorizing Provider  acetaminophen (TYLENOL) 325 MG tablet Take 2 tablets (650 mg total) by mouth every 6 (six) hours as needed for mild pain (or Fever >/= 101). 03/12/17   Garth Bignessimberlake, Kathryn, MD  aspirin EC 81 MG tablet Take 1 tablet (81 mg total) by mouth daily. 07/22/17   Casey BurkittFitzgerald, Hillary Moen, MD  atorvastatin (LIPITOR) 40 MG tablet Take 1 tablet (40 mg total) by mouth daily. 07/10/17   Freddrick MarchAmin, Yashika, MD  mirtazapine (REMERON) 7.5 MG tablet Take 7.5 mg by mouth at bedtime.    [provider]  NUTRITIONAL SUPPLEMENT LIQD Take 120 mLs by mouth 3 (three) times daily. [employer] protein shakes    [provider]  sertraline (ZOLOFT) 25 MG tablet Take 25 mg by mouth daily.    [provider]    ALLERGIES:  Allergies  Allergen Reactions  . Penicillins Hives    Has patient had a  PCN reaction causing immediate rash, facial/tongue/throat swelling, SOB or lightheadedness with hypotension: No Has patient had a PCN reaction causing severe rash involving mucus membranes or skin necrosis: No Has patient had a PCN reaction that required hospitalization: No Has patient had a PCN reaction occurring within the last 10 years: No If all of the above answers are "NO", then may proceed with Cephalosporin use.  Marland Kitchen. Penicillins Hives    SOCIAL HISTORY:  Social History   Tobacco Use  . Smoking status: Former Games developermoker  . Smokeless tobacco: Former Engineer, waterUser  Substance Use Topics  . Alcohol use: No    Alcohol/week: 0.0 oz    FAMILY HISTORY: Family History  Adopted: Yes  Problem Relation Age of Onset  . Alcohol abuse Mother     EXAM: BP (!) 149/78   Pulse 66   Temp 98.9 F (37.2 C) (Oral)   Resp 16   LMP  (LMP Unknown)   SpO2 97%  CONSTITUTIONAL: Alert and oriented to person.  She is elderly and in no distress. HEAD: Normocephalic; small abrasion that is hemostatic just next to the lateral aspect of her right eye with a small amount of periorbital ecchymosis and swelling EYES: Conjunctivae clear, PERRL, EOMI ENT: normal nose; no rhinorrhea; moist mucous membranes; pharynx without lesions noted; no dental injury; no septal hematoma NECK: Supple, no meningismus, no LAD; no midline  spinal tenderness, step-off or deformity; trachea midline CARD: RRR; S1 and S2 appreciated; no murmurs, no clicks, no rubs, no gallops RESP: Normal chest excursion without splinting or tachypnea; breath sounds clear and equal bilaterally; no wheezes, no rhonchi, no rales; no hypoxia or respiratory distress CHEST:  chest wall stable, no crepitus or ecchymosis or deformity, nontender to palpation; no flail chest ABD/GI: Normal bowel sounds; non-distended; soft, non-tender, no rebound, no guarding; no ecchymosis or other lesions noted PELVIS:  stable, nontender to palpation BACK:  The back appears normal  and is non-tender to palpation, there is no CVA tenderness; no midline spinal tenderness, step-off or deformity EXT: Normal ROM in all joints; non-tender to palpation; no edema; normal capillary refill; no cyanosis, no bony tenderness or bony deformity of patient's extremities, no joint effusion, compartments are soft, extremities are warm and well-perfused, no ecchymosis SKIN: Normal color for age and race; warm NEURO: Moves all extremities equally   MEDICAL DECISION MAKING: Patient here with unwitnessed fall.  Will obtain CT of her head and cervical spine.  Will update his tetanus vaccination.  She does not need any laceration repair at this time.  Will give Tylenol for pain control.  She is here frequently for the same.  At her neurologic baseline.  ED PROGRESS: CT scan shows no acute abnormality other than periorbital ecchymosis and swelling.  I feel she is safe to be discharged back to her nursing facility.  She is hemodynamically stable.  Afebrile.   At this time, I do not feel there is any life-threatening condition present. I have reviewed  (EKG, imaging, lab, urine as appropriate).  I have reviewed nursing notes and appropriate previous records.  I feel the patient is safe to be discharged home without further emergent workup and can continue workup as an outpatient as needed. Discussed usual and customary return precautions with nursing home staff. Outpatient follow-up has been provided if needed.       Piccola A[place], Layla MawKristen N, DO 11/20/17 0630

## 2017-11-20 NOTE — Discharge Instructions (Signed)
CT scan of her head and cervical spine showed no acute abnormality today.  You may clean the wound next to her right eye gently with warm soap and water.

## 2017-11-21 DIAGNOSIS — F0390 Unspecified dementia without behavioral disturbance: Secondary | ICD-10-CM | POA: Diagnosis not present

## 2017-11-22 DIAGNOSIS — F0390 Unspecified dementia without behavioral disturbance: Secondary | ICD-10-CM | POA: Diagnosis not present

## 2017-11-22 DIAGNOSIS — R2681 Unsteadiness on feet: Secondary | ICD-10-CM | POA: Diagnosis not present

## 2017-11-22 DIAGNOSIS — R531 Weakness: Secondary | ICD-10-CM | POA: Diagnosis not present

## 2017-11-22 DIAGNOSIS — S065X0D Traumatic subdural hemorrhage without loss of consciousness, subsequent encounter: Secondary | ICD-10-CM | POA: Diagnosis not present

## 2017-11-22 DIAGNOSIS — H547 Unspecified visual loss: Secondary | ICD-10-CM | POA: Diagnosis not present

## 2017-11-22 DIAGNOSIS — W19XXXD Unspecified fall, subsequent encounter: Secondary | ICD-10-CM | POA: Diagnosis not present

## 2017-11-22 DIAGNOSIS — Z7982 Long term (current) use of aspirin: Secondary | ICD-10-CM | POA: Diagnosis not present

## 2017-11-23 DIAGNOSIS — F0390 Unspecified dementia without behavioral disturbance: Secondary | ICD-10-CM | POA: Diagnosis not present

## 2017-11-24 DIAGNOSIS — F0390 Unspecified dementia without behavioral disturbance: Secondary | ICD-10-CM | POA: Diagnosis not present

## 2017-11-25 DIAGNOSIS — F0390 Unspecified dementia without behavioral disturbance: Secondary | ICD-10-CM | POA: Diagnosis not present

## 2017-11-26 DIAGNOSIS — R2681 Unsteadiness on feet: Secondary | ICD-10-CM | POA: Diagnosis not present

## 2017-11-26 DIAGNOSIS — Z7982 Long term (current) use of aspirin: Secondary | ICD-10-CM | POA: Diagnosis not present

## 2017-11-26 DIAGNOSIS — S065X0D Traumatic subdural hemorrhage without loss of consciousness, subsequent encounter: Secondary | ICD-10-CM | POA: Diagnosis not present

## 2017-11-26 DIAGNOSIS — F0390 Unspecified dementia without behavioral disturbance: Secondary | ICD-10-CM | POA: Diagnosis not present

## 2017-11-26 DIAGNOSIS — W19XXXD Unspecified fall, subsequent encounter: Secondary | ICD-10-CM | POA: Diagnosis not present

## 2017-11-26 DIAGNOSIS — H547 Unspecified visual loss: Secondary | ICD-10-CM | POA: Diagnosis not present

## 2017-11-26 DIAGNOSIS — R531 Weakness: Secondary | ICD-10-CM | POA: Diagnosis not present

## 2017-11-27 DIAGNOSIS — F331 Major depressive disorder, recurrent, moderate: Secondary | ICD-10-CM | POA: Diagnosis not present

## 2017-11-27 DIAGNOSIS — F0390 Unspecified dementia without behavioral disturbance: Secondary | ICD-10-CM | POA: Diagnosis not present

## 2017-11-27 DIAGNOSIS — F0150 Vascular dementia without behavioral disturbance: Secondary | ICD-10-CM | POA: Diagnosis not present

## 2017-11-27 DIAGNOSIS — F064 Anxiety disorder due to known physiological condition: Secondary | ICD-10-CM | POA: Diagnosis not present

## 2017-11-28 DIAGNOSIS — F0390 Unspecified dementia without behavioral disturbance: Secondary | ICD-10-CM | POA: Diagnosis not present

## 2017-11-28 DIAGNOSIS — R531 Weakness: Secondary | ICD-10-CM | POA: Diagnosis not present

## 2017-11-28 DIAGNOSIS — R2681 Unsteadiness on feet: Secondary | ICD-10-CM | POA: Diagnosis not present

## 2017-11-28 DIAGNOSIS — W19XXXD Unspecified fall, subsequent encounter: Secondary | ICD-10-CM | POA: Diagnosis not present

## 2017-11-28 DIAGNOSIS — Z7982 Long term (current) use of aspirin: Secondary | ICD-10-CM | POA: Diagnosis not present

## 2017-11-28 DIAGNOSIS — S065X0D Traumatic subdural hemorrhage without loss of consciousness, subsequent encounter: Secondary | ICD-10-CM | POA: Diagnosis not present

## 2017-11-28 DIAGNOSIS — H547 Unspecified visual loss: Secondary | ICD-10-CM | POA: Diagnosis not present

## 2017-11-29 DIAGNOSIS — F0390 Unspecified dementia without behavioral disturbance: Secondary | ICD-10-CM | POA: Diagnosis not present

## 2017-11-30 DIAGNOSIS — F0390 Unspecified dementia without behavioral disturbance: Secondary | ICD-10-CM | POA: Diagnosis not present

## 2017-12-01 DIAGNOSIS — F0390 Unspecified dementia without behavioral disturbance: Secondary | ICD-10-CM | POA: Diagnosis not present

## 2017-12-02 DIAGNOSIS — L853 Xerosis cutis: Secondary | ICD-10-CM | POA: Diagnosis not present

## 2017-12-02 DIAGNOSIS — L603 Nail dystrophy: Secondary | ICD-10-CM | POA: Diagnosis not present

## 2017-12-02 DIAGNOSIS — F0390 Unspecified dementia without behavioral disturbance: Secondary | ICD-10-CM | POA: Diagnosis not present

## 2017-12-02 DIAGNOSIS — M2020 Hallux rigidus, unspecified foot: Secondary | ICD-10-CM | POA: Diagnosis not present

## 2017-12-02 DIAGNOSIS — Q845 Enlarged and hypertrophic nails: Secondary | ICD-10-CM | POA: Diagnosis not present

## 2017-12-02 DIAGNOSIS — B351 Tinea unguium: Secondary | ICD-10-CM | POA: Diagnosis not present

## 2017-12-03 DIAGNOSIS — S065X0D Traumatic subdural hemorrhage without loss of consciousness, subsequent encounter: Secondary | ICD-10-CM | POA: Diagnosis not present

## 2017-12-03 DIAGNOSIS — R531 Weakness: Secondary | ICD-10-CM | POA: Diagnosis not present

## 2017-12-03 DIAGNOSIS — Z7982 Long term (current) use of aspirin: Secondary | ICD-10-CM | POA: Diagnosis not present

## 2017-12-03 DIAGNOSIS — W19XXXD Unspecified fall, subsequent encounter: Secondary | ICD-10-CM | POA: Diagnosis not present

## 2017-12-03 DIAGNOSIS — H547 Unspecified visual loss: Secondary | ICD-10-CM | POA: Diagnosis not present

## 2017-12-03 DIAGNOSIS — R2681 Unsteadiness on feet: Secondary | ICD-10-CM | POA: Diagnosis not present

## 2017-12-03 DIAGNOSIS — F0390 Unspecified dementia without behavioral disturbance: Secondary | ICD-10-CM | POA: Diagnosis not present

## 2017-12-04 DIAGNOSIS — F0390 Unspecified dementia without behavioral disturbance: Secondary | ICD-10-CM | POA: Diagnosis not present

## 2017-12-05 DIAGNOSIS — R531 Weakness: Secondary | ICD-10-CM | POA: Diagnosis not present

## 2017-12-05 DIAGNOSIS — F0390 Unspecified dementia without behavioral disturbance: Secondary | ICD-10-CM | POA: Diagnosis not present

## 2017-12-05 DIAGNOSIS — R2681 Unsteadiness on feet: Secondary | ICD-10-CM | POA: Diagnosis not present

## 2017-12-05 DIAGNOSIS — S065X0D Traumatic subdural hemorrhage without loss of consciousness, subsequent encounter: Secondary | ICD-10-CM | POA: Diagnosis not present

## 2017-12-05 DIAGNOSIS — Z7982 Long term (current) use of aspirin: Secondary | ICD-10-CM | POA: Diagnosis not present

## 2017-12-05 DIAGNOSIS — H547 Unspecified visual loss: Secondary | ICD-10-CM | POA: Diagnosis not present

## 2017-12-05 DIAGNOSIS — W19XXXD Unspecified fall, subsequent encounter: Secondary | ICD-10-CM | POA: Diagnosis not present

## 2017-12-06 DIAGNOSIS — F0390 Unspecified dementia without behavioral disturbance: Secondary | ICD-10-CM | POA: Diagnosis not present

## 2017-12-07 DIAGNOSIS — F0390 Unspecified dementia without behavioral disturbance: Secondary | ICD-10-CM | POA: Diagnosis not present

## 2017-12-08 DIAGNOSIS — F0390 Unspecified dementia without behavioral disturbance: Secondary | ICD-10-CM | POA: Diagnosis not present

## 2017-12-09 DIAGNOSIS — F0390 Unspecified dementia without behavioral disturbance: Secondary | ICD-10-CM | POA: Diagnosis not present

## 2017-12-10 DIAGNOSIS — F0390 Unspecified dementia without behavioral disturbance: Secondary | ICD-10-CM | POA: Diagnosis not present

## 2017-12-11 DIAGNOSIS — F0390 Unspecified dementia without behavioral disturbance: Secondary | ICD-10-CM | POA: Diagnosis not present

## 2017-12-11 DIAGNOSIS — R32 Unspecified urinary incontinence: Secondary | ICD-10-CM | POA: Diagnosis not present

## 2017-12-11 DIAGNOSIS — R159 Full incontinence of feces: Secondary | ICD-10-CM | POA: Diagnosis not present

## 2017-12-12 DIAGNOSIS — F064 Anxiety disorder due to known physiological condition: Secondary | ICD-10-CM | POA: Diagnosis not present

## 2017-12-12 DIAGNOSIS — F0390 Unspecified dementia without behavioral disturbance: Secondary | ICD-10-CM | POA: Diagnosis not present

## 2017-12-12 DIAGNOSIS — F0150 Vascular dementia without behavioral disturbance: Secondary | ICD-10-CM | POA: Diagnosis not present

## 2017-12-12 DIAGNOSIS — F331 Major depressive disorder, recurrent, moderate: Secondary | ICD-10-CM | POA: Diagnosis not present

## 2017-12-13 DIAGNOSIS — F0390 Unspecified dementia without behavioral disturbance: Secondary | ICD-10-CM | POA: Diagnosis not present

## 2017-12-14 DIAGNOSIS — F0390 Unspecified dementia without behavioral disturbance: Secondary | ICD-10-CM | POA: Diagnosis not present

## 2017-12-15 DIAGNOSIS — F0390 Unspecified dementia without behavioral disturbance: Secondary | ICD-10-CM | POA: Diagnosis not present

## 2017-12-16 DIAGNOSIS — F0390 Unspecified dementia without behavioral disturbance: Secondary | ICD-10-CM | POA: Diagnosis not present

## 2017-12-17 DIAGNOSIS — F0390 Unspecified dementia without behavioral disturbance: Secondary | ICD-10-CM | POA: Diagnosis not present

## 2017-12-18 DIAGNOSIS — F0390 Unspecified dementia without behavioral disturbance: Secondary | ICD-10-CM | POA: Diagnosis not present

## 2017-12-19 DIAGNOSIS — F0390 Unspecified dementia without behavioral disturbance: Secondary | ICD-10-CM | POA: Diagnosis not present

## 2017-12-20 DIAGNOSIS — Z0000 Encounter for general adult medical examination without abnormal findings: Secondary | ICD-10-CM | POA: Diagnosis not present

## 2017-12-20 DIAGNOSIS — F0390 Unspecified dementia without behavioral disturbance: Secondary | ICD-10-CM | POA: Diagnosis not present

## 2017-12-21 DIAGNOSIS — F0390 Unspecified dementia without behavioral disturbance: Secondary | ICD-10-CM | POA: Diagnosis not present

## 2017-12-22 DIAGNOSIS — F0390 Unspecified dementia without behavioral disturbance: Secondary | ICD-10-CM | POA: Diagnosis not present

## 2017-12-23 DIAGNOSIS — F0390 Unspecified dementia without behavioral disturbance: Secondary | ICD-10-CM | POA: Diagnosis not present

## 2017-12-24 DIAGNOSIS — F0390 Unspecified dementia without behavioral disturbance: Secondary | ICD-10-CM | POA: Diagnosis not present

## 2017-12-24 DIAGNOSIS — F411 Generalized anxiety disorder: Secondary | ICD-10-CM | POA: Diagnosis not present

## 2017-12-24 DIAGNOSIS — F322 Major depressive disorder, single episode, severe without psychotic features: Secondary | ICD-10-CM | POA: Diagnosis not present

## 2017-12-25 DIAGNOSIS — F0390 Unspecified dementia without behavioral disturbance: Secondary | ICD-10-CM | POA: Diagnosis not present

## 2017-12-26 DIAGNOSIS — F331 Major depressive disorder, recurrent, moderate: Secondary | ICD-10-CM | POA: Diagnosis not present

## 2017-12-26 DIAGNOSIS — F0390 Unspecified dementia without behavioral disturbance: Secondary | ICD-10-CM | POA: Diagnosis not present

## 2017-12-26 DIAGNOSIS — F0150 Vascular dementia without behavioral disturbance: Secondary | ICD-10-CM | POA: Diagnosis not present

## 2017-12-26 DIAGNOSIS — F064 Anxiety disorder due to known physiological condition: Secondary | ICD-10-CM | POA: Diagnosis not present

## 2017-12-27 DIAGNOSIS — F0390 Unspecified dementia without behavioral disturbance: Secondary | ICD-10-CM | POA: Diagnosis not present

## 2017-12-28 DIAGNOSIS — F0390 Unspecified dementia without behavioral disturbance: Secondary | ICD-10-CM | POA: Diagnosis not present

## 2017-12-29 DIAGNOSIS — F0390 Unspecified dementia without behavioral disturbance: Secondary | ICD-10-CM | POA: Diagnosis not present

## 2017-12-30 DIAGNOSIS — F0390 Unspecified dementia without behavioral disturbance: Secondary | ICD-10-CM | POA: Diagnosis not present

## 2017-12-31 DIAGNOSIS — G459 Transient cerebral ischemic attack, unspecified: Secondary | ICD-10-CM | POA: Diagnosis not present

## 2017-12-31 DIAGNOSIS — F322 Major depressive disorder, single episode, severe without psychotic features: Secondary | ICD-10-CM | POA: Diagnosis not present

## 2017-12-31 DIAGNOSIS — S065X0D Traumatic subdural hemorrhage without loss of consciousness, subsequent encounter: Secondary | ICD-10-CM | POA: Diagnosis not present

## 2017-12-31 DIAGNOSIS — F0390 Unspecified dementia without behavioral disturbance: Secondary | ICD-10-CM | POA: Diagnosis not present

## 2017-12-31 DIAGNOSIS — F419 Anxiety disorder, unspecified: Secondary | ICD-10-CM | POA: Diagnosis not present

## 2017-12-31 DIAGNOSIS — H547 Unspecified visual loss: Secondary | ICD-10-CM | POA: Diagnosis not present

## 2017-12-31 DIAGNOSIS — E440 Moderate protein-calorie malnutrition: Secondary | ICD-10-CM | POA: Diagnosis not present

## 2017-12-31 DIAGNOSIS — R41841 Cognitive communication deficit: Secondary | ICD-10-CM | POA: Diagnosis not present

## 2017-12-31 DIAGNOSIS — R296 Repeated falls: Secondary | ICD-10-CM | POA: Diagnosis not present

## 2017-12-31 DIAGNOSIS — S0231XD Fracture of orbital floor, right side, subsequent encounter for fracture with routine healing: Secondary | ICD-10-CM | POA: Diagnosis not present

## 2017-12-31 DIAGNOSIS — F411 Generalized anxiety disorder: Secondary | ICD-10-CM | POA: Diagnosis not present

## 2018-01-01 DIAGNOSIS — S065X0D Traumatic subdural hemorrhage without loss of consciousness, subsequent encounter: Secondary | ICD-10-CM | POA: Diagnosis not present

## 2018-01-01 DIAGNOSIS — H547 Unspecified visual loss: Secondary | ICD-10-CM | POA: Diagnosis not present

## 2018-01-01 DIAGNOSIS — R296 Repeated falls: Secondary | ICD-10-CM | POA: Diagnosis not present

## 2018-01-01 DIAGNOSIS — S0231XD Fracture of orbital floor, right side, subsequent encounter for fracture with routine healing: Secondary | ICD-10-CM | POA: Diagnosis not present

## 2018-01-01 DIAGNOSIS — G459 Transient cerebral ischemic attack, unspecified: Secondary | ICD-10-CM | POA: Diagnosis not present

## 2018-01-01 DIAGNOSIS — E440 Moderate protein-calorie malnutrition: Secondary | ICD-10-CM | POA: Diagnosis not present

## 2018-01-01 DIAGNOSIS — R41841 Cognitive communication deficit: Secondary | ICD-10-CM | POA: Diagnosis not present

## 2018-01-01 DIAGNOSIS — F0390 Unspecified dementia without behavioral disturbance: Secondary | ICD-10-CM | POA: Diagnosis not present

## 2018-01-01 DIAGNOSIS — F419 Anxiety disorder, unspecified: Secondary | ICD-10-CM | POA: Diagnosis not present

## 2018-01-02 DIAGNOSIS — G459 Transient cerebral ischemic attack, unspecified: Secondary | ICD-10-CM | POA: Diagnosis not present

## 2018-01-02 DIAGNOSIS — F0390 Unspecified dementia without behavioral disturbance: Secondary | ICD-10-CM | POA: Diagnosis not present

## 2018-01-02 DIAGNOSIS — S065X0D Traumatic subdural hemorrhage without loss of consciousness, subsequent encounter: Secondary | ICD-10-CM | POA: Diagnosis not present

## 2018-01-02 DIAGNOSIS — H547 Unspecified visual loss: Secondary | ICD-10-CM | POA: Diagnosis not present

## 2018-01-02 DIAGNOSIS — R296 Repeated falls: Secondary | ICD-10-CM | POA: Diagnosis not present

## 2018-01-02 DIAGNOSIS — F419 Anxiety disorder, unspecified: Secondary | ICD-10-CM | POA: Diagnosis not present

## 2018-01-02 DIAGNOSIS — E440 Moderate protein-calorie malnutrition: Secondary | ICD-10-CM | POA: Diagnosis not present

## 2018-01-02 DIAGNOSIS — S0231XD Fracture of orbital floor, right side, subsequent encounter for fracture with routine healing: Secondary | ICD-10-CM | POA: Diagnosis not present

## 2018-01-02 DIAGNOSIS — R41841 Cognitive communication deficit: Secondary | ICD-10-CM | POA: Diagnosis not present

## 2018-01-03 DIAGNOSIS — R41841 Cognitive communication deficit: Secondary | ICD-10-CM | POA: Diagnosis not present

## 2018-01-03 DIAGNOSIS — S0231XD Fracture of orbital floor, right side, subsequent encounter for fracture with routine healing: Secondary | ICD-10-CM | POA: Diagnosis not present

## 2018-01-03 DIAGNOSIS — R2681 Unsteadiness on feet: Secondary | ICD-10-CM | POA: Diagnosis not present

## 2018-01-03 DIAGNOSIS — H547 Unspecified visual loss: Secondary | ICD-10-CM | POA: Diagnosis not present

## 2018-01-03 DIAGNOSIS — S065X0D Traumatic subdural hemorrhage without loss of consciousness, subsequent encounter: Secondary | ICD-10-CM | POA: Diagnosis not present

## 2018-01-03 DIAGNOSIS — E440 Moderate protein-calorie malnutrition: Secondary | ICD-10-CM | POA: Diagnosis not present

## 2018-01-03 DIAGNOSIS — F0390 Unspecified dementia without behavioral disturbance: Secondary | ICD-10-CM | POA: Diagnosis not present

## 2018-01-03 DIAGNOSIS — R296 Repeated falls: Secondary | ICD-10-CM | POA: Diagnosis not present

## 2018-01-03 DIAGNOSIS — G459 Transient cerebral ischemic attack, unspecified: Secondary | ICD-10-CM | POA: Diagnosis not present

## 2018-01-03 DIAGNOSIS — F419 Anxiety disorder, unspecified: Secondary | ICD-10-CM | POA: Diagnosis not present

## 2018-01-04 DIAGNOSIS — F0390 Unspecified dementia without behavioral disturbance: Secondary | ICD-10-CM | POA: Diagnosis not present

## 2018-01-05 DIAGNOSIS — F0390 Unspecified dementia without behavioral disturbance: Secondary | ICD-10-CM | POA: Diagnosis not present

## 2018-01-06 DIAGNOSIS — E440 Moderate protein-calorie malnutrition: Secondary | ICD-10-CM | POA: Diagnosis not present

## 2018-01-06 DIAGNOSIS — S0231XD Fracture of orbital floor, right side, subsequent encounter for fracture with routine healing: Secondary | ICD-10-CM | POA: Diagnosis not present

## 2018-01-06 DIAGNOSIS — H547 Unspecified visual loss: Secondary | ICD-10-CM | POA: Diagnosis not present

## 2018-01-06 DIAGNOSIS — F419 Anxiety disorder, unspecified: Secondary | ICD-10-CM | POA: Diagnosis not present

## 2018-01-06 DIAGNOSIS — S065X0D Traumatic subdural hemorrhage without loss of consciousness, subsequent encounter: Secondary | ICD-10-CM | POA: Diagnosis not present

## 2018-01-06 DIAGNOSIS — R41841 Cognitive communication deficit: Secondary | ICD-10-CM | POA: Diagnosis not present

## 2018-01-06 DIAGNOSIS — G459 Transient cerebral ischemic attack, unspecified: Secondary | ICD-10-CM | POA: Diagnosis not present

## 2018-01-06 DIAGNOSIS — F0390 Unspecified dementia without behavioral disturbance: Secondary | ICD-10-CM | POA: Diagnosis not present

## 2018-01-06 DIAGNOSIS — R296 Repeated falls: Secondary | ICD-10-CM | POA: Diagnosis not present

## 2018-01-07 DIAGNOSIS — F419 Anxiety disorder, unspecified: Secondary | ICD-10-CM | POA: Diagnosis not present

## 2018-01-07 DIAGNOSIS — F322 Major depressive disorder, single episode, severe without psychotic features: Secondary | ICD-10-CM | POA: Diagnosis not present

## 2018-01-07 DIAGNOSIS — E440 Moderate protein-calorie malnutrition: Secondary | ICD-10-CM | POA: Diagnosis not present

## 2018-01-07 DIAGNOSIS — G459 Transient cerebral ischemic attack, unspecified: Secondary | ICD-10-CM | POA: Diagnosis not present

## 2018-01-07 DIAGNOSIS — F411 Generalized anxiety disorder: Secondary | ICD-10-CM | POA: Diagnosis not present

## 2018-01-07 DIAGNOSIS — R296 Repeated falls: Secondary | ICD-10-CM | POA: Diagnosis not present

## 2018-01-07 DIAGNOSIS — S065X0D Traumatic subdural hemorrhage without loss of consciousness, subsequent encounter: Secondary | ICD-10-CM | POA: Diagnosis not present

## 2018-01-07 DIAGNOSIS — F0390 Unspecified dementia without behavioral disturbance: Secondary | ICD-10-CM | POA: Diagnosis not present

## 2018-01-07 DIAGNOSIS — R41841 Cognitive communication deficit: Secondary | ICD-10-CM | POA: Diagnosis not present

## 2018-01-07 DIAGNOSIS — S0231XD Fracture of orbital floor, right side, subsequent encounter for fracture with routine healing: Secondary | ICD-10-CM | POA: Diagnosis not present

## 2018-01-07 DIAGNOSIS — H547 Unspecified visual loss: Secondary | ICD-10-CM | POA: Diagnosis not present

## 2018-01-08 DIAGNOSIS — S065X0D Traumatic subdural hemorrhage without loss of consciousness, subsequent encounter: Secondary | ICD-10-CM | POA: Diagnosis not present

## 2018-01-08 DIAGNOSIS — S0231XD Fracture of orbital floor, right side, subsequent encounter for fracture with routine healing: Secondary | ICD-10-CM | POA: Diagnosis not present

## 2018-01-08 DIAGNOSIS — F419 Anxiety disorder, unspecified: Secondary | ICD-10-CM | POA: Diagnosis not present

## 2018-01-08 DIAGNOSIS — R296 Repeated falls: Secondary | ICD-10-CM | POA: Diagnosis not present

## 2018-01-08 DIAGNOSIS — E440 Moderate protein-calorie malnutrition: Secondary | ICD-10-CM | POA: Diagnosis not present

## 2018-01-08 DIAGNOSIS — G459 Transient cerebral ischemic attack, unspecified: Secondary | ICD-10-CM | POA: Diagnosis not present

## 2018-01-08 DIAGNOSIS — F0390 Unspecified dementia without behavioral disturbance: Secondary | ICD-10-CM | POA: Diagnosis not present

## 2018-01-08 DIAGNOSIS — R41841 Cognitive communication deficit: Secondary | ICD-10-CM | POA: Diagnosis not present

## 2018-01-08 DIAGNOSIS — H547 Unspecified visual loss: Secondary | ICD-10-CM | POA: Diagnosis not present

## 2018-01-09 DIAGNOSIS — F0390 Unspecified dementia without behavioral disturbance: Secondary | ICD-10-CM | POA: Diagnosis not present

## 2018-01-10 DIAGNOSIS — F0390 Unspecified dementia without behavioral disturbance: Secondary | ICD-10-CM | POA: Diagnosis not present

## 2018-01-10 DIAGNOSIS — R32 Unspecified urinary incontinence: Secondary | ICD-10-CM | POA: Diagnosis not present

## 2018-01-10 DIAGNOSIS — R159 Full incontinence of feces: Secondary | ICD-10-CM | POA: Diagnosis not present

## 2018-01-11 DIAGNOSIS — F0390 Unspecified dementia without behavioral disturbance: Secondary | ICD-10-CM | POA: Diagnosis not present

## 2018-01-12 DIAGNOSIS — F0390 Unspecified dementia without behavioral disturbance: Secondary | ICD-10-CM | POA: Diagnosis not present

## 2018-01-13 DIAGNOSIS — G459 Transient cerebral ischemic attack, unspecified: Secondary | ICD-10-CM | POA: Diagnosis not present

## 2018-01-13 DIAGNOSIS — R41841 Cognitive communication deficit: Secondary | ICD-10-CM | POA: Diagnosis not present

## 2018-01-13 DIAGNOSIS — F0390 Unspecified dementia without behavioral disturbance: Secondary | ICD-10-CM | POA: Diagnosis not present

## 2018-01-13 DIAGNOSIS — R296 Repeated falls: Secondary | ICD-10-CM | POA: Diagnosis not present

## 2018-01-13 DIAGNOSIS — F419 Anxiety disorder, unspecified: Secondary | ICD-10-CM | POA: Diagnosis not present

## 2018-01-13 DIAGNOSIS — H547 Unspecified visual loss: Secondary | ICD-10-CM | POA: Diagnosis not present

## 2018-01-13 DIAGNOSIS — E440 Moderate protein-calorie malnutrition: Secondary | ICD-10-CM | POA: Diagnosis not present

## 2018-01-13 DIAGNOSIS — S0231XD Fracture of orbital floor, right side, subsequent encounter for fracture with routine healing: Secondary | ICD-10-CM | POA: Diagnosis not present

## 2018-01-13 DIAGNOSIS — S065X0D Traumatic subdural hemorrhage without loss of consciousness, subsequent encounter: Secondary | ICD-10-CM | POA: Diagnosis not present

## 2018-01-14 DIAGNOSIS — E440 Moderate protein-calorie malnutrition: Secondary | ICD-10-CM | POA: Diagnosis not present

## 2018-01-14 DIAGNOSIS — F0390 Unspecified dementia without behavioral disturbance: Secondary | ICD-10-CM | POA: Diagnosis not present

## 2018-01-14 DIAGNOSIS — R41841 Cognitive communication deficit: Secondary | ICD-10-CM | POA: Diagnosis not present

## 2018-01-14 DIAGNOSIS — S065X0D Traumatic subdural hemorrhage without loss of consciousness, subsequent encounter: Secondary | ICD-10-CM | POA: Diagnosis not present

## 2018-01-14 DIAGNOSIS — S0231XD Fracture of orbital floor, right side, subsequent encounter for fracture with routine healing: Secondary | ICD-10-CM | POA: Diagnosis not present

## 2018-01-14 DIAGNOSIS — G459 Transient cerebral ischemic attack, unspecified: Secondary | ICD-10-CM | POA: Diagnosis not present

## 2018-01-14 DIAGNOSIS — R296 Repeated falls: Secondary | ICD-10-CM | POA: Diagnosis not present

## 2018-01-14 DIAGNOSIS — F419 Anxiety disorder, unspecified: Secondary | ICD-10-CM | POA: Diagnosis not present

## 2018-01-14 DIAGNOSIS — H547 Unspecified visual loss: Secondary | ICD-10-CM | POA: Diagnosis not present

## 2018-01-15 DIAGNOSIS — E440 Moderate protein-calorie malnutrition: Secondary | ICD-10-CM | POA: Diagnosis not present

## 2018-01-15 DIAGNOSIS — S065X0D Traumatic subdural hemorrhage without loss of consciousness, subsequent encounter: Secondary | ICD-10-CM | POA: Diagnosis not present

## 2018-01-15 DIAGNOSIS — G459 Transient cerebral ischemic attack, unspecified: Secondary | ICD-10-CM | POA: Diagnosis not present

## 2018-01-15 DIAGNOSIS — R41841 Cognitive communication deficit: Secondary | ICD-10-CM | POA: Diagnosis not present

## 2018-01-15 DIAGNOSIS — H547 Unspecified visual loss: Secondary | ICD-10-CM | POA: Diagnosis not present

## 2018-01-15 DIAGNOSIS — R296 Repeated falls: Secondary | ICD-10-CM | POA: Diagnosis not present

## 2018-01-15 DIAGNOSIS — F0390 Unspecified dementia without behavioral disturbance: Secondary | ICD-10-CM | POA: Diagnosis not present

## 2018-01-15 DIAGNOSIS — S0231XD Fracture of orbital floor, right side, subsequent encounter for fracture with routine healing: Secondary | ICD-10-CM | POA: Diagnosis not present

## 2018-01-15 DIAGNOSIS — F419 Anxiety disorder, unspecified: Secondary | ICD-10-CM | POA: Diagnosis not present

## 2018-01-16 DIAGNOSIS — F0390 Unspecified dementia without behavioral disturbance: Secondary | ICD-10-CM | POA: Diagnosis not present

## 2018-01-17 DIAGNOSIS — F0390 Unspecified dementia without behavioral disturbance: Secondary | ICD-10-CM | POA: Diagnosis not present

## 2018-01-18 DIAGNOSIS — F0390 Unspecified dementia without behavioral disturbance: Secondary | ICD-10-CM | POA: Diagnosis not present

## 2018-01-19 DIAGNOSIS — F0390 Unspecified dementia without behavioral disturbance: Secondary | ICD-10-CM | POA: Diagnosis not present

## 2018-01-20 DIAGNOSIS — F419 Anxiety disorder, unspecified: Secondary | ICD-10-CM | POA: Diagnosis not present

## 2018-01-20 DIAGNOSIS — R296 Repeated falls: Secondary | ICD-10-CM | POA: Diagnosis not present

## 2018-01-20 DIAGNOSIS — E440 Moderate protein-calorie malnutrition: Secondary | ICD-10-CM | POA: Diagnosis not present

## 2018-01-20 DIAGNOSIS — S0231XD Fracture of orbital floor, right side, subsequent encounter for fracture with routine healing: Secondary | ICD-10-CM | POA: Diagnosis not present

## 2018-01-20 DIAGNOSIS — H547 Unspecified visual loss: Secondary | ICD-10-CM | POA: Diagnosis not present

## 2018-01-20 DIAGNOSIS — G459 Transient cerebral ischemic attack, unspecified: Secondary | ICD-10-CM | POA: Diagnosis not present

## 2018-01-20 DIAGNOSIS — R41841 Cognitive communication deficit: Secondary | ICD-10-CM | POA: Diagnosis not present

## 2018-01-20 DIAGNOSIS — S065X0D Traumatic subdural hemorrhage without loss of consciousness, subsequent encounter: Secondary | ICD-10-CM | POA: Diagnosis not present

## 2018-01-20 DIAGNOSIS — F0390 Unspecified dementia without behavioral disturbance: Secondary | ICD-10-CM | POA: Diagnosis not present

## 2018-01-21 ENCOUNTER — Other Ambulatory Visit: Payer: Self-pay

## 2018-01-21 ENCOUNTER — Emergency Department (HOSPITAL_COMMUNITY): Payer: Medicare HMO

## 2018-01-21 ENCOUNTER — Emergency Department (HOSPITAL_COMMUNITY)
Admission: EM | Admit: 2018-01-21 | Discharge: 2018-01-21 | Disposition: A | Discharge status: Nursing Home (Includes ICF) | Payer: Medicare HMO | Attending: Emergency Medicine | Admitting: Emergency Medicine

## 2018-01-21 ENCOUNTER — Encounter (HOSPITAL_COMMUNITY): Payer: Self-pay

## 2018-01-21 DIAGNOSIS — Y939 Activity, unspecified: Secondary | ICD-10-CM | POA: Diagnosis not present

## 2018-01-21 DIAGNOSIS — I10 Essential (primary) hypertension: Secondary | ICD-10-CM | POA: Diagnosis not present

## 2018-01-21 DIAGNOSIS — Z79899 Other long term (current) drug therapy: Secondary | ICD-10-CM | POA: Diagnosis not present

## 2018-01-21 DIAGNOSIS — Z87891 Personal history of nicotine dependence: Secondary | ICD-10-CM | POA: Insufficient documentation

## 2018-01-21 DIAGNOSIS — Z7982 Long term (current) use of aspirin: Secondary | ICD-10-CM | POA: Insufficient documentation

## 2018-01-21 DIAGNOSIS — N3000 Acute cystitis without hematuria: Secondary | ICD-10-CM

## 2018-01-21 DIAGNOSIS — Y92128 Other place in nursing home as the place of occurrence of the external cause: Secondary | ICD-10-CM | POA: Insufficient documentation

## 2018-01-21 DIAGNOSIS — G8911 Acute pain due to trauma: Secondary | ICD-10-CM | POA: Diagnosis not present

## 2018-01-21 DIAGNOSIS — W19XXXA Unspecified fall, initial encounter: Secondary | ICD-10-CM | POA: Diagnosis not present

## 2018-01-21 DIAGNOSIS — Y999 Unspecified external cause status: Secondary | ICD-10-CM | POA: Diagnosis not present

## 2018-01-21 DIAGNOSIS — S0003XA Contusion of scalp, initial encounter: Secondary | ICD-10-CM | POA: Insufficient documentation

## 2018-01-21 DIAGNOSIS — F411 Generalized anxiety disorder: Secondary | ICD-10-CM | POA: Diagnosis not present

## 2018-01-21 DIAGNOSIS — S299XXA Unspecified injury of thorax, initial encounter: Secondary | ICD-10-CM | POA: Diagnosis not present

## 2018-01-21 DIAGNOSIS — G4489 Other headache syndrome: Secondary | ICD-10-CM | POA: Diagnosis not present

## 2018-01-21 DIAGNOSIS — S0083XA Contusion of other part of head, initial encounter: Secondary | ICD-10-CM

## 2018-01-21 DIAGNOSIS — S3992XA Unspecified injury of lower back, initial encounter: Secondary | ICD-10-CM | POA: Diagnosis not present

## 2018-01-21 DIAGNOSIS — F322 Major depressive disorder, single episode, severe without psychotic features: Secondary | ICD-10-CM | POA: Diagnosis not present

## 2018-01-21 DIAGNOSIS — M542 Cervicalgia: Secondary | ICD-10-CM | POA: Diagnosis not present

## 2018-01-21 DIAGNOSIS — R51 Headache: Secondary | ICD-10-CM | POA: Diagnosis not present

## 2018-01-21 DIAGNOSIS — F0390 Unspecified dementia without behavioral disturbance: Secondary | ICD-10-CM | POA: Diagnosis not present

## 2018-01-21 DIAGNOSIS — S79912A Unspecified injury of left hip, initial encounter: Secondary | ICD-10-CM | POA: Diagnosis not present

## 2018-01-21 DIAGNOSIS — S199XXA Unspecified injury of neck, initial encounter: Secondary | ICD-10-CM | POA: Diagnosis not present

## 2018-01-21 DIAGNOSIS — S0990XA Unspecified injury of head, initial encounter: Secondary | ICD-10-CM | POA: Diagnosis not present

## 2018-01-21 DIAGNOSIS — S79911A Unspecified injury of right hip, initial encounter: Secondary | ICD-10-CM | POA: Diagnosis not present

## 2018-01-21 LAB — URINALYSIS, ROUTINE W REFLEX MICROSCOPIC
Bilirubin Urine: NEGATIVE
GLUCOSE, UA: NEGATIVE mg/dL
HGB URINE DIPSTICK: NEGATIVE
KETONES UR: NEGATIVE mg/dL
Nitrite: NEGATIVE
PROTEIN: NEGATIVE mg/dL
Specific Gravity, Urine: 1.023 (ref 1.005–1.030)
pH: 7 (ref 5.0–8.0)

## 2018-01-21 MED ORDER — CEPHALEXIN 500 MG PO CAPS
500.0000 mg | ORAL_CAPSULE | Freq: Once | ORAL | Status: AC
  Administered 2018-01-21: 500 mg via ORAL
  Filled 2018-01-21: qty 1

## 2018-01-21 MED ORDER — ACETAMINOPHEN 325 MG PO TABS
650.0000 mg | ORAL_TABLET | Freq: Once | ORAL | Status: AC
  Administered 2018-01-21: 650 mg via ORAL
  Filled 2018-01-21: qty 2

## 2018-01-21 MED ORDER — CEPHALEXIN 500 MG PO CAPS: 500.0000 mg | ORAL_CAPSULE | Freq: Two times a day (BID) | ORAL | 0 refills | Status: AC

## 2018-01-21 NOTE — ED Provider Notes (Signed)
Medical screening examination/treatment/procedure(s) were conducted as a shared visit with non-physician practitioner(s) and myself.  I personally evaluated the patient during the encounter.   EKG Interpretation None     Patient has been suffering from recurrent falls.  She does have history of dementia.  She is sent for evaluation with unwitnessed fall and hematoma to the posterior occipital area.  Patient is alert and cheerful.  No respiratory distress.  She is using both upper extremities without difficulty.  No lower extremity pain to range of motion.  I have evaluated the patient after completion of her CT and diagnostic scans which are negative.  Patient is pleasantly interactive.  She requests something to drink.  She is able to hold the cup with slight assistance and drink.  Been able to observe the patient on several occasions as she has been in a hallway bed.  He has not shown signs of distress, not expressing pain with transitions to transport stretcher.  At time of discharge the very pleasant and alert.Marland Kitchen.   Arby BarrettePfeiffer, Jerene Yeager, MD 01/25/18 [phone]

## 2018-01-21 NOTE — ED Notes (Signed)
Bed: [facility] Expected date:  Expected time:  Means of arrival:  Comments: 

## 2018-01-21 NOTE — ED Triage Notes (Signed)
EMS reports from [place], unwitnessed fall, 2.5cm hematoma left occipital, Pt c/o head pain Hx of dementia  BP 134/82 HR 76 Resp 20 CBG 145 sp02 98 RA

## 2018-01-21 NOTE — Discharge Instructions (Signed)
Your urine was suggestive of a urinary tract infection.  Your first dose of Keflex, and antibiotic has been given in the emergency department.  Please take Keflex every 12 hours for the next 5 days.  Your urine has been sent for culture.  Please follow-up with your primary care provider regarding your more frequent falls over the last few weeks.  Take 650 mg of Tylenol every 6 hours as needed for pain control.  Please do not take more than 4000 mg of Tylenol in a 24-hour.  If you develop any new or worsening symptoms, including worsening confusion, another fall, or other new concerning symptoms, please return to the emergency department for re-evaluation.
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
## 2018-01-21 NOTE — ED Notes (Signed)
[facility] contacted for discharge transport [facility]

## 2018-01-22 DIAGNOSIS — H547 Unspecified visual loss: Secondary | ICD-10-CM | POA: Diagnosis not present

## 2018-01-22 DIAGNOSIS — E440 Moderate protein-calorie malnutrition: Secondary | ICD-10-CM | POA: Diagnosis not present

## 2018-01-22 DIAGNOSIS — F419 Anxiety disorder, unspecified: Secondary | ICD-10-CM | POA: Diagnosis not present

## 2018-01-22 DIAGNOSIS — S065X0D Traumatic subdural hemorrhage without loss of consciousness, subsequent encounter: Secondary | ICD-10-CM | POA: Diagnosis not present

## 2018-01-22 DIAGNOSIS — G459 Transient cerebral ischemic attack, unspecified: Secondary | ICD-10-CM | POA: Diagnosis not present

## 2018-01-22 DIAGNOSIS — R41841 Cognitive communication deficit: Secondary | ICD-10-CM | POA: Diagnosis not present

## 2018-01-22 DIAGNOSIS — S0231XD Fracture of orbital floor, right side, subsequent encounter for fracture with routine healing: Secondary | ICD-10-CM | POA: Diagnosis not present

## 2018-01-22 DIAGNOSIS — F0390 Unspecified dementia without behavioral disturbance: Secondary | ICD-10-CM | POA: Diagnosis not present

## 2018-01-22 DIAGNOSIS — R296 Repeated falls: Secondary | ICD-10-CM | POA: Diagnosis not present

## 2018-01-23 DIAGNOSIS — F0390 Unspecified dementia without behavioral disturbance: Secondary | ICD-10-CM | POA: Diagnosis not present

## 2018-01-24 DIAGNOSIS — F0390 Unspecified dementia without behavioral disturbance: Secondary | ICD-10-CM | POA: Diagnosis not present

## 2018-01-24 LAB — URINE CULTURE: Culture: 30000 — AB

## 2018-01-25 ENCOUNTER — Telehealth: Payer: Self-pay

## 2018-01-25 DIAGNOSIS — F0390 Unspecified dementia without behavioral disturbance: Secondary | ICD-10-CM | POA: Diagnosis not present

## 2018-01-25 NOTE — Telephone Encounter (Signed)
UC fax[facility]: [phone] with change in Abx treatment top Bactrim DS per [facility] Ascope Nesse NP

## 2018-01-26 DIAGNOSIS — F0390 Unspecified dementia without behavioral disturbance: Secondary | ICD-10-CM | POA: Diagnosis not present

## 2018-01-26 NOTE — Progress Notes (Signed)
ED Antimicrobial Stewardship Positive Culture Follow Up   Olivia Robertson is an 82 y.o. female who presented to [facility]21[facility]2018 with a chief complaint of recurrent falls Chief Complaint  Patient presents with  . Fall    Recent Results (from the past 720 hour(s))  Urine culture     Status: Abnormal   Collection Time: 01/21/18  7:28 PM  Result Value Ref Range Status   Specimen Description   Final    URINE, RANDOM Performed at [facility], [address] [place], [place] [address]    Special Requests   Final    NONE Performed at [facility], [address] [place], [place] [address]    Culture (A)  Final    30,000 COLONIES/mL METHICILLIN RESISTANT STAPHYLOCOCCUS AUREUS   Report Status 01/24/2018 FINAL  Final   Organism ID, Bacteria METHICILLIN RESISTANT STAPHYLOCOCCUS AUREUS (A)  Final      Susceptibility   Methicillin resistant staphylococcus aureus - MIC*    CIPROFLOXACIN >=8 RESISTANT Resistant     GENTAMICIN <=0.5 SENSITIVE Sensitive     NITROFURANTOIN <=16 SENSITIVE Sensitive     OXACILLIN >=4 RESISTANT Resistant     TETRACYCLINE <=1 SENSITIVE Sensitive     VANCOMYCIN <=0.5 SENSITIVE Sensitive     TRIMETH/SULFA <=10 SENSITIVE Sensitive     CLINDAMYCIN <=0.25 SENSITIVE Sensitive     RIFAMPIN <=0.5 SENSITIVE Sensitive     Inducible Clindamycin NEGATIVE Sensitive     * 30,000 COLONIES/mL METHICILLIN RESISTANT STAPHYLOCOCCUS AUREUS   [x]  Treated with cephalexin, organism resistant to prescribed antimicrobial []  Patient discharged originally without antimicrobial agent and treatment is now indicated  New antibiotic prescription: Bactrim DS BID x 7 days  ED Provider: Brooke DareHope Neese  [name] L Emilie Carp 01/26/2018, 5:08 PM

## 2018-01-27 DIAGNOSIS — R41841 Cognitive communication deficit: Secondary | ICD-10-CM | POA: Diagnosis not present

## 2018-01-27 DIAGNOSIS — S0231XD Fracture of orbital floor, right side, subsequent encounter for fracture with routine healing: Secondary | ICD-10-CM | POA: Diagnosis not present

## 2018-01-27 DIAGNOSIS — F0390 Unspecified dementia without behavioral disturbance: Secondary | ICD-10-CM | POA: Diagnosis not present

## 2018-01-27 DIAGNOSIS — R296 Repeated falls: Secondary | ICD-10-CM | POA: Diagnosis not present

## 2018-01-27 DIAGNOSIS — G459 Transient cerebral ischemic attack, unspecified: Secondary | ICD-10-CM | POA: Diagnosis not present

## 2018-01-27 DIAGNOSIS — H547 Unspecified visual loss: Secondary | ICD-10-CM | POA: Diagnosis not present

## 2018-01-27 DIAGNOSIS — E440 Moderate protein-calorie malnutrition: Secondary | ICD-10-CM | POA: Diagnosis not present

## 2018-01-27 DIAGNOSIS — F419 Anxiety disorder, unspecified: Secondary | ICD-10-CM | POA: Diagnosis not present

## 2018-01-27 DIAGNOSIS — S065X0D Traumatic subdural hemorrhage without loss of consciousness, subsequent encounter: Secondary | ICD-10-CM | POA: Diagnosis not present

## 2018-01-28 ENCOUNTER — Emergency Department (HOSPITAL_COMMUNITY)
Admission: EM | Admit: 2018-01-28 | Discharge: 2018-01-28 | Disposition: A | Discharge status: Skilled Nursing Facility | Payer: Medicare HMO | Attending: Emergency Medicine | Admitting: Emergency Medicine

## 2018-01-28 ENCOUNTER — Emergency Department (HOSPITAL_COMMUNITY): Payer: Medicare HMO

## 2018-01-28 ENCOUNTER — Encounter (HOSPITAL_COMMUNITY): Payer: Self-pay

## 2018-01-28 ENCOUNTER — Other Ambulatory Visit: Payer: Self-pay

## 2018-01-28 DIAGNOSIS — Z7982 Long term (current) use of aspirin: Secondary | ICD-10-CM | POA: Diagnosis not present

## 2018-01-28 DIAGNOSIS — S0990XA Unspecified injury of head, initial encounter: Secondary | ICD-10-CM | POA: Diagnosis not present

## 2018-01-28 DIAGNOSIS — Y939 Activity, unspecified: Secondary | ICD-10-CM | POA: Insufficient documentation

## 2018-01-28 DIAGNOSIS — S199XXA Unspecified injury of neck, initial encounter: Secondary | ICD-10-CM | POA: Diagnosis not present

## 2018-01-28 DIAGNOSIS — F0390 Unspecified dementia without behavioral disturbance: Secondary | ICD-10-CM | POA: Insufficient documentation

## 2018-01-28 DIAGNOSIS — S0101XA Laceration without foreign body of scalp, initial encounter: Secondary | ICD-10-CM | POA: Diagnosis not present

## 2018-01-28 DIAGNOSIS — Z87891 Personal history of nicotine dependence: Secondary | ICD-10-CM | POA: Diagnosis not present

## 2018-01-28 DIAGNOSIS — Y92129 Unspecified place in nursing home as the place of occurrence of the external cause: Secondary | ICD-10-CM | POA: Insufficient documentation

## 2018-01-28 DIAGNOSIS — I10 Essential (primary) hypertension: Secondary | ICD-10-CM | POA: Insufficient documentation

## 2018-01-28 DIAGNOSIS — S0181XA Laceration without foreign body of other part of head, initial encounter: Secondary | ICD-10-CM | POA: Diagnosis not present

## 2018-01-28 DIAGNOSIS — R51 Headache: Secondary | ICD-10-CM | POA: Insufficient documentation

## 2018-01-28 DIAGNOSIS — Y999 Unspecified external cause status: Secondary | ICD-10-CM | POA: Diagnosis not present

## 2018-01-28 DIAGNOSIS — W1830XA Fall on same level, unspecified, initial encounter: Secondary | ICD-10-CM | POA: Insufficient documentation

## 2018-01-28 DIAGNOSIS — Y929 Unspecified place or not applicable: Secondary | ICD-10-CM | POA: Insufficient documentation

## 2018-01-28 DIAGNOSIS — W19XXXA Unspecified fall, initial encounter: Secondary | ICD-10-CM

## 2018-01-28 DIAGNOSIS — S098XXA Other specified injuries of head, initial encounter: Secondary | ICD-10-CM | POA: Diagnosis not present

## 2018-01-28 DIAGNOSIS — F411 Generalized anxiety disorder: Secondary | ICD-10-CM | POA: Diagnosis not present

## 2018-01-28 DIAGNOSIS — Z79899 Other long term (current) drug therapy: Secondary | ICD-10-CM | POA: Diagnosis not present

## 2018-01-28 DIAGNOSIS — F322 Major depressive disorder, single episode, severe without psychotic features: Secondary | ICD-10-CM | POA: Diagnosis not present

## 2018-01-28 DIAGNOSIS — R0781 Pleurodynia: Secondary | ICD-10-CM | POA: Diagnosis not present

## 2018-01-28 DIAGNOSIS — S299XXA Unspecified injury of thorax, initial encounter: Secondary | ICD-10-CM | POA: Diagnosis not present

## 2018-01-28 MED ORDER — BACITRACIN ZINC 500 UNIT/GM EX OINT: TOPICAL_OINTMENT | Freq: Once | CUTANEOUS | Status: DC

## 2018-01-28 MED ORDER — BACITRACIN ZINC 500 UNIT/GM EX OINT: 1.0000 "application " | TOPICAL_OINTMENT | Freq: Two times a day (BID) | CUTANEOUS | 0 refills | Status: DC

## 2018-01-28 NOTE — ED Notes (Signed)
Bed: [facility] Expected date:  Expected time:  Means of arrival:  Comments: 
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
## 2018-01-28 NOTE — Discharge Instructions (Signed)
Keep the laceration site dry for the next 24 hours and leave the dressing in place. After 24 hours you may remove the dressing and gently clean the laceration site with antibacterial soap and warm water. Do not scrub the area. Do not soak the area and water for long periods of time. Apply topical bacitracin 1-2 times per day for the next 7 days. Return to the emergency department in 7 days for removal of the staples.  Scalp laceration generally heal very well with minimal risk of infection, however, you should return sooner for any signs of infection which would include increased redness around the wound, increased swelling, new drainage of yellow pus. ° °

## 2018-01-28 NOTE — ED Triage Notes (Signed)
[facility], unwitnessed fall from standing, fell backwards, c/o head pain and headache, laceration to occipital head, no LOC, Not on thinners. Currently being treated for UTI, Hx of falls and dementia.  BP 130/80 HR 90 Resp 20 CBG 137

## 2018-01-29 DIAGNOSIS — R41841 Cognitive communication deficit: Secondary | ICD-10-CM | POA: Diagnosis not present

## 2018-01-29 DIAGNOSIS — R296 Repeated falls: Secondary | ICD-10-CM | POA: Diagnosis not present

## 2018-01-29 DIAGNOSIS — E440 Moderate protein-calorie malnutrition: Secondary | ICD-10-CM | POA: Diagnosis not present

## 2018-01-29 DIAGNOSIS — S0231XD Fracture of orbital floor, right side, subsequent encounter for fracture with routine healing: Secondary | ICD-10-CM | POA: Diagnosis not present

## 2018-01-29 DIAGNOSIS — G459 Transient cerebral ischemic attack, unspecified: Secondary | ICD-10-CM | POA: Diagnosis not present

## 2018-01-29 DIAGNOSIS — S065X0D Traumatic subdural hemorrhage without loss of consciousness, subsequent encounter: Secondary | ICD-10-CM | POA: Diagnosis not present

## 2018-01-29 DIAGNOSIS — F419 Anxiety disorder, unspecified: Secondary | ICD-10-CM | POA: Diagnosis not present

## 2018-01-29 DIAGNOSIS — F0390 Unspecified dementia without behavioral disturbance: Secondary | ICD-10-CM | POA: Diagnosis not present

## 2018-01-29 DIAGNOSIS — H547 Unspecified visual loss: Secondary | ICD-10-CM | POA: Diagnosis not present

## 2018-01-30 DIAGNOSIS — F0390 Unspecified dementia without behavioral disturbance: Secondary | ICD-10-CM | POA: Diagnosis not present

## 2018-01-31 DIAGNOSIS — N390 Urinary tract infection, site not specified: Secondary | ICD-10-CM | POA: Diagnosis not present

## 2018-01-31 DIAGNOSIS — R296 Repeated falls: Secondary | ICD-10-CM | POA: Diagnosis not present

## 2018-01-31 DIAGNOSIS — M545 Low back pain: Secondary | ICD-10-CM | POA: Diagnosis not present

## 2018-01-31 DIAGNOSIS — F0390 Unspecified dementia without behavioral disturbance: Secondary | ICD-10-CM | POA: Diagnosis not present

## 2018-02-01 DIAGNOSIS — F0390 Unspecified dementia without behavioral disturbance: Secondary | ICD-10-CM | POA: Diagnosis not present

## 2018-02-02 DIAGNOSIS — F0390 Unspecified dementia without behavioral disturbance: Secondary | ICD-10-CM | POA: Diagnosis not present

## 2018-02-03 DIAGNOSIS — F0390 Unspecified dementia without behavioral disturbance: Secondary | ICD-10-CM | POA: Diagnosis not present

## 2018-02-03 DIAGNOSIS — M545 Low back pain: Secondary | ICD-10-CM | POA: Diagnosis not present

## 2018-02-04 DIAGNOSIS — F322 Major depressive disorder, single episode, severe without psychotic features: Secondary | ICD-10-CM | POA: Diagnosis not present

## 2018-02-04 DIAGNOSIS — F411 Generalized anxiety disorder: Secondary | ICD-10-CM | POA: Diagnosis not present

## 2018-02-04 DIAGNOSIS — H3530 Unspecified macular degeneration: Secondary | ICD-10-CM | POA: Diagnosis not present

## 2018-02-04 DIAGNOSIS — H527 Unspecified disorder of refraction: Secondary | ICD-10-CM | POA: Diagnosis not present

## 2018-02-04 DIAGNOSIS — F0390 Unspecified dementia without behavioral disturbance: Secondary | ICD-10-CM | POA: Diagnosis not present

## 2018-02-05 DIAGNOSIS — F0390 Unspecified dementia without behavioral disturbance: Secondary | ICD-10-CM | POA: Diagnosis not present

## 2018-02-06 ENCOUNTER — Emergency Department (HOSPITAL_COMMUNITY)
Admission: EM | Admit: 2018-02-06 | Discharge: 2018-02-06 | Disposition: A | Discharge status: Home / Self Care | Payer: Medicare HMO | Attending: Emergency Medicine | Admitting: Emergency Medicine

## 2018-02-06 ENCOUNTER — Emergency Department (HOSPITAL_COMMUNITY): Payer: Medicare HMO

## 2018-02-06 ENCOUNTER — Encounter (HOSPITAL_COMMUNITY): Payer: Self-pay | Admitting: Emergency Medicine

## 2018-02-06 DIAGNOSIS — S0101XD Laceration without foreign body of scalp, subsequent encounter: Secondary | ICD-10-CM | POA: Diagnosis not present

## 2018-02-06 DIAGNOSIS — Y939 Activity, unspecified: Secondary | ICD-10-CM | POA: Insufficient documentation

## 2018-02-06 DIAGNOSIS — F0150 Vascular dementia without behavioral disturbance: Secondary | ICD-10-CM | POA: Diagnosis not present

## 2018-02-06 DIAGNOSIS — Y92129 Unspecified place in nursing home as the place of occurrence of the external cause: Secondary | ICD-10-CM | POA: Insufficient documentation

## 2018-02-06 DIAGNOSIS — W1830XA Fall on same level, unspecified, initial encounter: Secondary | ICD-10-CM | POA: Diagnosis not present

## 2018-02-06 DIAGNOSIS — R296 Repeated falls: Secondary | ICD-10-CM | POA: Insufficient documentation

## 2018-02-06 DIAGNOSIS — S299XXA Unspecified injury of thorax, initial encounter: Secondary | ICD-10-CM | POA: Diagnosis not present

## 2018-02-06 DIAGNOSIS — R51 Headache: Secondary | ICD-10-CM | POA: Diagnosis not present

## 2018-02-06 DIAGNOSIS — Y999 Unspecified external cause status: Secondary | ICD-10-CM | POA: Diagnosis not present

## 2018-02-06 DIAGNOSIS — F331 Major depressive disorder, recurrent, moderate: Secondary | ICD-10-CM | POA: Diagnosis not present

## 2018-02-06 DIAGNOSIS — F0390 Unspecified dementia without behavioral disturbance: Secondary | ICD-10-CM | POA: Insufficient documentation

## 2018-02-06 DIAGNOSIS — I10 Essential (primary) hypertension: Secondary | ICD-10-CM | POA: Insufficient documentation

## 2018-02-06 DIAGNOSIS — S199XXA Unspecified injury of neck, initial encounter: Secondary | ICD-10-CM | POA: Diagnosis not present

## 2018-02-06 DIAGNOSIS — Z4802 Encounter for removal of sutures: Secondary | ICD-10-CM | POA: Insufficient documentation

## 2018-02-06 DIAGNOSIS — S01111A Laceration without foreign body of right eyelid and periocular area, initial encounter: Secondary | ICD-10-CM | POA: Insufficient documentation

## 2018-02-06 DIAGNOSIS — S0990XA Unspecified injury of head, initial encounter: Secondary | ICD-10-CM | POA: Diagnosis not present

## 2018-02-06 DIAGNOSIS — W19XXXA Unspecified fall, initial encounter: Secondary | ICD-10-CM

## 2018-02-06 DIAGNOSIS — Z7982 Long term (current) use of aspirin: Secondary | ICD-10-CM | POA: Diagnosis not present

## 2018-02-06 DIAGNOSIS — Z87891 Personal history of nicotine dependence: Secondary | ICD-10-CM | POA: Insufficient documentation

## 2018-02-06 DIAGNOSIS — F064 Anxiety disorder due to known physiological condition: Secondary | ICD-10-CM | POA: Diagnosis not present

## 2018-02-06 DIAGNOSIS — S098XXA Other specified injuries of head, initial encounter: Secondary | ICD-10-CM | POA: Diagnosis not present

## 2018-02-06 DIAGNOSIS — R079 Chest pain, unspecified: Secondary | ICD-10-CM | POA: Diagnosis not present

## 2018-02-06 DIAGNOSIS — S0993XA Unspecified injury of face, initial encounter: Secondary | ICD-10-CM | POA: Diagnosis not present

## 2018-02-06 MED ORDER — LIDOCAINE-EPINEPHRINE (PF) 2 %-1:200000 IJ SOLN
10.0000 mL | Freq: Once | INTRAMUSCULAR | Status: AC
  Administered 2018-02-06: 10 mL
  Filled 2018-02-06: qty 20

## 2018-02-06 NOTE — ED Notes (Signed)
Bed: [id number] Expected date:  Expected time:  Means of arrival:  Comments: EMS-fall/dementia

## 2018-02-06 NOTE — ED Triage Notes (Signed)
Per [facility] for fall while reaching for something in dinning room. Was witnessed by staff. No LOC but takes ASA daily. EMS reports small laceration to forehead. c-collar on and in place at this time.

## 2018-02-06 NOTE — Discharge Instructions (Signed)
You were seen in the emergency department today for a fall and sustained a laceration to your right eyebrow area.  CT scan of your head and neck as well as the x-ray of your left ribs did not show any acute abnormalities.  Staples were removed from the back of your head from your previous ED visit 0 2/12.  4 absorbable stitches were placed in your eyebrow laceration.  These will dissolve on their own and will not need to be removed.  Return to the emergency department for any fever, chills, redness, discharge from the wound.  Return for any other new or worsening symptoms or any concerns you may have.
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
## 2018-02-06 NOTE — ED Notes (Signed)
Patient son called asking about patient status since SNF wouldn't given him any information other than she fell and sent to ED.   Son asking if MD can write an order for wheelchair for Medicaid to pay for that she can use at SNF. Informed son I would have to talk with Case Management and EDP, as I was uncertain if that could be done in the ED.

## 2018-02-07 DIAGNOSIS — R2681 Unsteadiness on feet: Secondary | ICD-10-CM | POA: Diagnosis not present

## 2018-02-07 DIAGNOSIS — R296 Repeated falls: Secondary | ICD-10-CM | POA: Diagnosis not present

## 2018-02-07 DIAGNOSIS — F0390 Unspecified dementia without behavioral disturbance: Secondary | ICD-10-CM | POA: Diagnosis not present

## 2018-02-07 DIAGNOSIS — N390 Urinary tract infection, site not specified: Secondary | ICD-10-CM | POA: Diagnosis not present

## 2018-02-07 DIAGNOSIS — R159 Full incontinence of feces: Secondary | ICD-10-CM | POA: Diagnosis not present

## 2018-02-07 DIAGNOSIS — M545 Low back pain: Secondary | ICD-10-CM | POA: Diagnosis not present

## 2018-02-07 DIAGNOSIS — R32 Unspecified urinary incontinence: Secondary | ICD-10-CM | POA: Diagnosis not present

## 2018-02-08 DIAGNOSIS — F0390 Unspecified dementia without behavioral disturbance: Secondary | ICD-10-CM | POA: Diagnosis not present

## 2018-02-09 DIAGNOSIS — F0390 Unspecified dementia without behavioral disturbance: Secondary | ICD-10-CM | POA: Diagnosis not present

## 2018-02-10 DIAGNOSIS — F0390 Unspecified dementia without behavioral disturbance: Secondary | ICD-10-CM | POA: Diagnosis not present

## 2018-02-11 ENCOUNTER — Emergency Department (HOSPITAL_COMMUNITY)
Admission: EM | Admit: 2018-02-11 | Discharge: 2018-02-11 | Disposition: A | Discharge status: Home / Self Care | Payer: Medicare HMO | Attending: Emergency Medicine | Admitting: Emergency Medicine

## 2018-02-11 ENCOUNTER — Other Ambulatory Visit: Payer: Self-pay

## 2018-02-11 ENCOUNTER — Encounter (HOSPITAL_COMMUNITY): Payer: Self-pay

## 2018-02-11 DIAGNOSIS — R55 Syncope and collapse: Secondary | ICD-10-CM | POA: Diagnosis not present

## 2018-02-11 DIAGNOSIS — Z79899 Other long term (current) drug therapy: Secondary | ICD-10-CM | POA: Diagnosis not present

## 2018-02-11 DIAGNOSIS — R42 Dizziness and giddiness: Secondary | ICD-10-CM | POA: Diagnosis not present

## 2018-02-11 DIAGNOSIS — F0390 Unspecified dementia without behavioral disturbance: Secondary | ICD-10-CM | POA: Diagnosis not present

## 2018-02-11 DIAGNOSIS — I10 Essential (primary) hypertension: Secondary | ICD-10-CM | POA: Diagnosis not present

## 2018-02-11 DIAGNOSIS — Z7982 Long term (current) use of aspirin: Secondary | ICD-10-CM | POA: Insufficient documentation

## 2018-02-11 DIAGNOSIS — R404 Transient alteration of awareness: Secondary | ICD-10-CM | POA: Diagnosis not present

## 2018-02-11 DIAGNOSIS — R4182 Altered mental status, unspecified: Secondary | ICD-10-CM | POA: Diagnosis not present

## 2018-02-11 DIAGNOSIS — Z87891 Personal history of nicotine dependence: Secondary | ICD-10-CM | POA: Diagnosis not present

## 2018-02-11 DIAGNOSIS — F411 Generalized anxiety disorder: Secondary | ICD-10-CM | POA: Diagnosis not present

## 2018-02-11 DIAGNOSIS — F322 Major depressive disorder, single episode, severe without psychotic features: Secondary | ICD-10-CM | POA: Diagnosis not present

## 2018-02-11 LAB — I-STAT CHEM 8, ED
BUN: 16 mg/dL (ref 6–20)
Calcium, Ion: 1.14 mmol/L — ABNORMAL LOW (ref 1.15–1.40)
Chloride: 104 mmol/L (ref 101–111)
Creatinine, Ser: 0.5 mg/dL (ref 0.44–1.00)
Glucose, Bld: 151 mg/dL — ABNORMAL HIGH (ref 65–99)
HEMATOCRIT: 34 % — AB (ref 36.0–46.0)
HEMOGLOBIN: 11.6 g/dL — AB (ref 12.0–15.0)
POTASSIUM: 3.4 mmol/L — AB (ref 3.5–5.1)
SODIUM: 142 mmol/L (ref 135–145)
TCO2: 25 mmol/L (ref 22–32)

## 2018-02-11 NOTE — ED Triage Notes (Signed)
[facility]- pt coming from [facility] after a near syncopal episode. On EMS arrival pt was noted to be pale, diaphoretic, HR 48 with frequent PAC's. Pt was given 250cc  fluid and HR improved to 60. Pt has dementia at baseline but is alert to her norm. Vitals stable with EMS. CBG 294

## 2018-02-11 NOTE — ED Notes (Signed)
[facility]    Patient's son was contacted and informed that the [facility]

## 2018-02-11 NOTE — ED Notes (Signed)
[facility]

## 2018-02-11 NOTE — ED Notes (Signed)
[facility]

## 2018-02-11 NOTE — Discharge Instructions (Signed)
Follow up with your PCP.  Eat and drink well for the next couple of days.  °
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
## 2018-02-12 DIAGNOSIS — F0390 Unspecified dementia without behavioral disturbance: Secondary | ICD-10-CM | POA: Diagnosis not present

## 2018-02-13 DIAGNOSIS — F0390 Unspecified dementia without behavioral disturbance: Secondary | ICD-10-CM | POA: Diagnosis not present

## 2018-02-14 DIAGNOSIS — F0390 Unspecified dementia without behavioral disturbance: Secondary | ICD-10-CM | POA: Diagnosis not present

## 2018-02-14 DIAGNOSIS — Z4802 Encounter for removal of sutures: Secondary | ICD-10-CM | POA: Diagnosis not present

## 2018-02-14 DIAGNOSIS — R55 Syncope and collapse: Secondary | ICD-10-CM | POA: Diagnosis not present

## 2018-02-15 DIAGNOSIS — F0390 Unspecified dementia without behavioral disturbance: Secondary | ICD-10-CM | POA: Diagnosis not present

## 2018-02-16 DIAGNOSIS — F0390 Unspecified dementia without behavioral disturbance: Secondary | ICD-10-CM | POA: Diagnosis not present

## 2018-02-17 DIAGNOSIS — F0390 Unspecified dementia without behavioral disturbance: Secondary | ICD-10-CM | POA: Diagnosis not present

## 2018-02-18 DIAGNOSIS — R296 Repeated falls: Secondary | ICD-10-CM | POA: Diagnosis not present

## 2018-02-18 DIAGNOSIS — R41841 Cognitive communication deficit: Secondary | ICD-10-CM | POA: Diagnosis not present

## 2018-02-18 DIAGNOSIS — F322 Major depressive disorder, single episode, severe without psychotic features: Secondary | ICD-10-CM | POA: Diagnosis not present

## 2018-02-18 DIAGNOSIS — F411 Generalized anxiety disorder: Secondary | ICD-10-CM | POA: Diagnosis not present

## 2018-02-18 DIAGNOSIS — F0390 Unspecified dementia without behavioral disturbance: Secondary | ICD-10-CM | POA: Diagnosis not present

## 2018-02-18 DIAGNOSIS — E440 Moderate protein-calorie malnutrition: Secondary | ICD-10-CM | POA: Diagnosis not present

## 2018-02-18 DIAGNOSIS — G459 Transient cerebral ischemic attack, unspecified: Secondary | ICD-10-CM | POA: Diagnosis not present

## 2018-02-18 DIAGNOSIS — F419 Anxiety disorder, unspecified: Secondary | ICD-10-CM | POA: Diagnosis not present

## 2018-02-18 DIAGNOSIS — S0231XD Fracture of orbital floor, right side, subsequent encounter for fracture with routine healing: Secondary | ICD-10-CM | POA: Diagnosis not present

## 2018-02-18 DIAGNOSIS — S065X0D Traumatic subdural hemorrhage without loss of consciousness, subsequent encounter: Secondary | ICD-10-CM | POA: Diagnosis not present

## 2018-02-18 DIAGNOSIS — H547 Unspecified visual loss: Secondary | ICD-10-CM | POA: Diagnosis not present

## 2018-02-19 DIAGNOSIS — F0390 Unspecified dementia without behavioral disturbance: Secondary | ICD-10-CM | POA: Diagnosis not present

## 2018-02-19 DIAGNOSIS — R2689 Other abnormalities of gait and mobility: Secondary | ICD-10-CM | POA: Diagnosis not present

## 2018-02-20 DIAGNOSIS — F419 Anxiety disorder, unspecified: Secondary | ICD-10-CM | POA: Diagnosis not present

## 2018-02-20 DIAGNOSIS — R296 Repeated falls: Secondary | ICD-10-CM | POA: Diagnosis not present

## 2018-02-20 DIAGNOSIS — F0390 Unspecified dementia without behavioral disturbance: Secondary | ICD-10-CM | POA: Diagnosis not present

## 2018-02-20 DIAGNOSIS — S0231XD Fracture of orbital floor, right side, subsequent encounter for fracture with routine healing: Secondary | ICD-10-CM | POA: Diagnosis not present

## 2018-02-20 DIAGNOSIS — H547 Unspecified visual loss: Secondary | ICD-10-CM | POA: Diagnosis not present

## 2018-02-20 DIAGNOSIS — F0150 Vascular dementia without behavioral disturbance: Secondary | ICD-10-CM | POA: Diagnosis not present

## 2018-02-20 DIAGNOSIS — R41841 Cognitive communication deficit: Secondary | ICD-10-CM | POA: Diagnosis not present

## 2018-02-20 DIAGNOSIS — F064 Anxiety disorder due to known physiological condition: Secondary | ICD-10-CM | POA: Diagnosis not present

## 2018-02-20 DIAGNOSIS — E440 Moderate protein-calorie malnutrition: Secondary | ICD-10-CM | POA: Diagnosis not present

## 2018-02-20 DIAGNOSIS — G459 Transient cerebral ischemic attack, unspecified: Secondary | ICD-10-CM | POA: Diagnosis not present

## 2018-02-20 DIAGNOSIS — S065X0D Traumatic subdural hemorrhage without loss of consciousness, subsequent encounter: Secondary | ICD-10-CM | POA: Diagnosis not present

## 2018-02-20 DIAGNOSIS — F331 Major depressive disorder, recurrent, moderate: Secondary | ICD-10-CM | POA: Diagnosis not present

## 2018-02-21 DIAGNOSIS — F0390 Unspecified dementia without behavioral disturbance: Secondary | ICD-10-CM | POA: Diagnosis not present

## 2018-02-22 ENCOUNTER — Encounter (HOSPITAL_COMMUNITY): Payer: Self-pay | Admitting: Emergency Medicine

## 2018-02-22 ENCOUNTER — Emergency Department (HOSPITAL_COMMUNITY)
Admission: EM | Admit: 2018-02-22 | Discharge: 2018-02-23 | Disposition: A | Discharge status: Skilled Nursing Facility | Payer: Medicare HMO | Attending: Emergency Medicine | Admitting: Emergency Medicine

## 2018-02-22 ENCOUNTER — Other Ambulatory Visit: Payer: Self-pay

## 2018-02-22 DIAGNOSIS — W1830XA Fall on same level, unspecified, initial encounter: Secondary | ICD-10-CM | POA: Insufficient documentation

## 2018-02-22 DIAGNOSIS — I10 Essential (primary) hypertension: Secondary | ICD-10-CM | POA: Diagnosis not present

## 2018-02-22 DIAGNOSIS — F0390 Unspecified dementia without behavioral disturbance: Secondary | ICD-10-CM | POA: Insufficient documentation

## 2018-02-22 DIAGNOSIS — Y939 Activity, unspecified: Secondary | ICD-10-CM | POA: Insufficient documentation

## 2018-02-22 DIAGNOSIS — S098XXA Other specified injuries of head, initial encounter: Secondary | ICD-10-CM | POA: Diagnosis not present

## 2018-02-22 DIAGNOSIS — Z87891 Personal history of nicotine dependence: Secondary | ICD-10-CM | POA: Diagnosis not present

## 2018-02-22 DIAGNOSIS — Y92129 Unspecified place in nursing home as the place of occurrence of the external cause: Secondary | ICD-10-CM | POA: Insufficient documentation

## 2018-02-22 DIAGNOSIS — Z79899 Other long term (current) drug therapy: Secondary | ICD-10-CM | POA: Diagnosis not present

## 2018-02-22 DIAGNOSIS — Y999 Unspecified external cause status: Secondary | ICD-10-CM | POA: Diagnosis not present

## 2018-02-22 DIAGNOSIS — W19XXXA Unspecified fall, initial encounter: Secondary | ICD-10-CM

## 2018-02-22 DIAGNOSIS — R51 Headache: Secondary | ICD-10-CM | POA: Insufficient documentation

## 2018-02-22 DIAGNOSIS — G4489 Other headache syndrome: Secondary | ICD-10-CM | POA: Diagnosis not present

## 2018-02-22 DIAGNOSIS — Z043 Encounter for examination and observation following other accident: Secondary | ICD-10-CM | POA: Diagnosis present

## 2018-02-22 DIAGNOSIS — R55 Syncope and collapse: Secondary | ICD-10-CM | POA: Diagnosis not present

## 2018-02-22 HISTORY — DX: Traumatic subdural hemorrhage with loss of consciousness status unknown, initial encounter: S06.5XAA

## 2018-02-22 HISTORY — DX: Traumatic subdural hemorrhage with loss of consciousness of unspecified duration, initial encounter: S06.5X9A
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
## 2018-02-22 NOTE — ED Notes (Signed)
Bed: [id number] Expected date:  Expected time:  Means of arrival:  Comments: 82 yo F/Hip pain-fall SNF

## 2018-02-22 NOTE — ED Notes (Signed)
Patient unable to sign for self due to dementia.  

## 2018-02-22 NOTE — Discharge Instructions (Signed)
Please try to get help before getting up.  Make sure that you are taking her medicines, eating and drinking well.

## 2018-02-22 NOTE — ED Notes (Signed)
Attempted to call SNF for report, no answer. [facility] called for transport.

## 2018-02-23 DIAGNOSIS — F0390 Unspecified dementia without behavioral disturbance: Secondary | ICD-10-CM | POA: Diagnosis not present

## 2018-02-23 DIAGNOSIS — S0990XA Unspecified injury of head, initial encounter: Secondary | ICD-10-CM | POA: Diagnosis not present

## 2018-02-23 DIAGNOSIS — R51 Headache: Secondary | ICD-10-CM | POA: Diagnosis not present

## 2018-02-23 NOTE — ED Notes (Signed)
PTAR at bedside 

## 2018-02-24 DIAGNOSIS — F0390 Unspecified dementia without behavioral disturbance: Secondary | ICD-10-CM | POA: Diagnosis not present

## 2018-02-25 DIAGNOSIS — F0390 Unspecified dementia without behavioral disturbance: Secondary | ICD-10-CM | POA: Diagnosis not present

## 2018-02-26 DIAGNOSIS — F0390 Unspecified dementia without behavioral disturbance: Secondary | ICD-10-CM | POA: Diagnosis not present

## 2018-02-27 DIAGNOSIS — R296 Repeated falls: Secondary | ICD-10-CM | POA: Diagnosis not present

## 2018-02-27 DIAGNOSIS — F0390 Unspecified dementia without behavioral disturbance: Secondary | ICD-10-CM | POA: Diagnosis not present

## 2018-02-27 DIAGNOSIS — R41841 Cognitive communication deficit: Secondary | ICD-10-CM | POA: Diagnosis not present

## 2018-02-27 DIAGNOSIS — S098XXA Other specified injuries of head, initial encounter: Secondary | ICD-10-CM | POA: Diagnosis not present

## 2018-02-27 DIAGNOSIS — E440 Moderate protein-calorie malnutrition: Secondary | ICD-10-CM | POA: Diagnosis not present

## 2018-02-27 DIAGNOSIS — S0231XD Fracture of orbital floor, right side, subsequent encounter for fracture with routine healing: Secondary | ICD-10-CM | POA: Diagnosis not present

## 2018-02-27 DIAGNOSIS — F419 Anxiety disorder, unspecified: Secondary | ICD-10-CM | POA: Diagnosis not present

## 2018-02-27 DIAGNOSIS — H547 Unspecified visual loss: Secondary | ICD-10-CM | POA: Diagnosis not present

## 2018-02-27 DIAGNOSIS — S065X0D Traumatic subdural hemorrhage without loss of consciousness, subsequent encounter: Secondary | ICD-10-CM | POA: Diagnosis not present

## 2018-02-27 DIAGNOSIS — G459 Transient cerebral ischemic attack, unspecified: Secondary | ICD-10-CM | POA: Diagnosis not present

## 2018-02-27 DIAGNOSIS — S0993XA Unspecified injury of face, initial encounter: Secondary | ICD-10-CM | POA: Diagnosis not present

## 2018-02-28 ENCOUNTER — Emergency Department (HOSPITAL_COMMUNITY)
Admission: EM | Admit: 2018-02-28 | Discharge: 2018-02-28 | Disposition: A | Discharge status: Home / Self Care | Payer: Medicare HMO | Attending: Emergency Medicine | Admitting: Emergency Medicine

## 2018-02-28 ENCOUNTER — Emergency Department (HOSPITAL_COMMUNITY): Payer: Medicare HMO

## 2018-02-28 DIAGNOSIS — G8911 Acute pain due to trauma: Secondary | ICD-10-CM | POA: Diagnosis not present

## 2018-02-28 DIAGNOSIS — Z7982 Long term (current) use of aspirin: Secondary | ICD-10-CM | POA: Diagnosis not present

## 2018-02-28 DIAGNOSIS — W19XXXA Unspecified fall, initial encounter: Secondary | ICD-10-CM | POA: Diagnosis not present

## 2018-02-28 DIAGNOSIS — Z79899 Other long term (current) drug therapy: Secondary | ICD-10-CM | POA: Insufficient documentation

## 2018-02-28 DIAGNOSIS — S022XXA Fracture of nasal bones, initial encounter for closed fracture: Secondary | ICD-10-CM

## 2018-02-28 DIAGNOSIS — S0990XA Unspecified injury of head, initial encounter: Secondary | ICD-10-CM

## 2018-02-28 DIAGNOSIS — Y999 Unspecified external cause status: Secondary | ICD-10-CM | POA: Diagnosis not present

## 2018-02-28 DIAGNOSIS — Z87891 Personal history of nicotine dependence: Secondary | ICD-10-CM | POA: Diagnosis not present

## 2018-02-28 DIAGNOSIS — Z8673 Personal history of transient ischemic attack (TIA), and cerebral infarction without residual deficits: Secondary | ICD-10-CM | POA: Insufficient documentation

## 2018-02-28 DIAGNOSIS — S199XXA Unspecified injury of neck, initial encounter: Secondary | ICD-10-CM | POA: Diagnosis not present

## 2018-02-28 DIAGNOSIS — S0101XA Laceration without foreign body of scalp, initial encounter: Secondary | ICD-10-CM

## 2018-02-28 DIAGNOSIS — I10 Essential (primary) hypertension: Secondary | ICD-10-CM | POA: Insufficient documentation

## 2018-02-28 DIAGNOSIS — Y939 Activity, unspecified: Secondary | ICD-10-CM | POA: Diagnosis not present

## 2018-02-28 DIAGNOSIS — F0390 Unspecified dementia without behavioral disturbance: Secondary | ICD-10-CM | POA: Diagnosis not present

## 2018-02-28 DIAGNOSIS — Y92129 Unspecified place in nursing home as the place of occurrence of the external cause: Secondary | ICD-10-CM | POA: Insufficient documentation

## 2018-02-28 DIAGNOSIS — M542 Cervicalgia: Secondary | ICD-10-CM | POA: Diagnosis not present

## 2018-02-28 MED ORDER — LIDOCAINE-EPINEPHRINE-TETRACAINE (LET) SOLUTION: 3.0000 mL | Freq: Once | NASAL | Status: DC

## 2018-02-28 MED ORDER — BACITRACIN ZINC 500 UNIT/GM EX OINT
TOPICAL_OINTMENT | CUTANEOUS | Status: AC
  Administered 2018-02-28: 4
  Filled 2018-02-28: qty 0.9

## 2018-02-28 MED ORDER — ACETAMINOPHEN 500 MG PO TABS
1000.0000 mg | ORAL_TABLET | Freq: Once | ORAL | Status: AC
  Administered 2018-02-28: 1000 mg via ORAL
  Filled 2018-02-28: qty 2

## 2018-02-28 NOTE — Discharge Instructions (Signed)
I recommend that you have your staples removed in 7-10 days.  You may take Tylenol 1000 mg every 6 hours as needed for pain.

## 2018-02-28 NOTE — ED Triage Notes (Signed)
BIB [facility] for unwitnessed  fall around 2345, pt sustained laceration to rt forehead, and has a swollen nose. Denies neck or back pain, no LOC. A/O x4. Pain 9/10. Pt is not on blood thinners.

## 2018-02-28 NOTE — ED Notes (Signed)
Cleaned laceration on top of head. MD placed staples. Per MD placed bacitracin with nonadherent dressing. Also cleansed on bilateral eyebrows where lacerations were and patted small amount of bacitracin on top. Cleansed around nose and placed bacitracin on as well. Continue to monitor.

## 2018-02-28 NOTE — ED Notes (Signed)
Gave report to Noralee SpaceBianca Jackson, RN at [facility]. PTAR is now on the way to facility. Left name in case she had additional questions for me.

## 2018-02-28 NOTE — ED Notes (Signed)
[facility] Expected date:  Expected time:  Means of arrival:  Comments: EMS 82 yo female from [facility]-fell out of bed-head lac-no LOC/no blood thinners

## 2018-02-28 NOTE — ED Provider Notes (Signed)
TIME SEEN: 1:17 AM  CHIEF COMPLAINT: Fall  HPI: Patient is a 82 year old female with history of hypertension, hyperlipidemia, frequent falls, dementia who lives at [facility] who presents to the emergency department after she states she rolled out of bed.  She did strike her head.  States her last tetanus vaccination was within the past 5 years.  Complaining of head, neck and facial pain.  No loss of consciousness.  Not on blood thinners.  No numbness, tingling or focal weakness.  ROS: See HPI Constitutional: no fever  Eyes: no drainage  ENT: no runny nose   Cardiovascular:  no chest pain  Resp: no SOB  GI: no vomiting GU: no dysuria Integumentary: no rash  Allergy: no hives  Musculoskeletal: no leg swelling  Neurological: no slurred speech ROS otherwise negative  PAST MEDICAL HISTORY/PAST SURGICAL HISTORY:  Past Medical History:  Diagnosis Date  . Anxiety   . Closed fracture of left distal radius   . Decreased appetite 05/07/2017  . Delirium   . Distal radius fracture, left   . Frailty syndrome in geriatric patient 07/15/2017  . History of posttraumatic stress disorder (PTSD)   . History of prediabetes   . HTN (hypertension)   . Hyperlipidemia   . Impaired memory    Likely dementia  . Laceration of skin of forehead   . Nasal fracture   . Stroke (HCC)   . Thoracic aortic atherosclerosis (HCC) 07/15/2017  . Traumatic subdural hemorrhage (HCC)   . UTI (urinary tract infection)     MEDICATIONS:  Prior to Admission medications   Medication Sig Start Date End Date Taking? Authorizing Provider  acetaminophen (TYLENOL) 500 MG tablet Take 500-1,000 mg by mouth every 6 (six) hours as needed. FOR MILD TO SEVERE PAIN    [provider]  aspirin EC 81 MG tablet Take 1 tablet (81 mg total) by mouth daily. 07/22/17   Casey Burkitt, MD  atorvastatin (LIPITOR) 40 MG tablet Take 1 tablet (40 mg total) by mouth daily. 07/10/17   Freddrick March, MD  bacitracin  ointment Apply 1 application topically 2 (two) times daily. 01/28/18   Malaika Arnall, Chase Picket, PA-C  cephALEXin (KEFLEX) 500 MG capsule Take 500 mg by mouth 2 (two) times daily.    [provider]  hydrOXYzine (ATARAX/VISTARIL) 25 MG tablet Take 25 mg by mouth 2 (two) times daily.    [provider]  LORazepam (ATIVAN) 0.5 MG tablet Take 0.25 mg by mouth every 12 (twelve) hours as needed for anxiety.    [provider]  neomycin-bacitracin-polymyxin (NEOSPORIN) [phone] ointment Apply 1 application topically daily. APPLY TO UPPER BACK ONCE DAILY AFTER WASHING WITH SOAP AND WATER    [provider]  NUTRITIONAL SUPPLEMENT LIQD Take 120 mLs by mouth 3 (three) times daily. [employer] protein shakes    [provider]  sertraline (ZOLOFT) 50 MG tablet Take 12.5 mg by mouth daily.     [provider]  sulfamethoxazole-trimethoprim (BACTRIM DS,SEPTRA DS) 800-160 MG tablet Take 1 tablet by mouth 2 (two) times daily. FOR 7 DAYS 01/25/18   [provider]    ALLERGIES:  Allergies  Allergen Reactions  . Penicillins Hives    Has patient had a PCN reaction causing immediate rash, facial/tongue/throat swelling, SOB or lightheadedness with hypotension: No Has patient had a PCN reaction causing severe rash involving mucus membranes or skin necrosis: No Has patient had a PCN reaction that required hospitalization: No Has patient had a PCN reaction occurring  within the last 10 years: No If all of the above answers are "NO", then may proceed with Cephalosporin use.  Marland Kitchen. Penicillins Hives    SOCIAL HISTORY:  Social History   Tobacco Use  . Smoking status: Former Games developermoker  . Smokeless tobacco: Former Engineer, waterUser  Substance Use Topics  . Alcohol use: No    Alcohol/week: 0.0 oz    FAMILY HISTORY: Family History  Adopted: Yes  Problem Relation Age of Onset  . Alcohol abuse Mother     EXAM: BP (!) 150/71 (BP Location: Left Arm)   Pulse 69   Temp 98.8 F  (37.1 C) (Oral)   Resp 16   LMP  (LMP Unknown)   SpO2 98%  CONSTITUTIONAL: Alert and oriented x4 and responds appropriately to questions. Well-appearing; well-nourished; GCS 15 HEAD: Normocephalic; 4 cm laceration to the top of her scalp EYES: Conjunctivae clear, PERRL, EOMI ENT: normal nose; no rhinorrhea; moist mucous membranes; pharynx without lesions noted; no dental injury; no septal hematoma, swelling noted to her nose with bony tenderness NECK: Supple, no meningismus, no LAD; no midline spinal tenderness, step-off or deformity; trachea midline CARD: RRR; S1 and S2 appreciated; no murmurs, no clicks, no rubs, no gallops RESP: Normal chest excursion without splinting or tachypnea; breath sounds clear and equal bilaterally; no wheezes, no rhonchi, no rales; no hypoxia or respiratory distress CHEST:  chest wall stable, no crepitus or ecchymosis or deformity, nontender to palpation; no flail chest ABD/GI: Normal bowel sounds; non-distended; soft, non-tender, no rebound, no guarding; no ecchymosis or other lesions noted PELVIS:  stable, nontender to palpation BACK:  The back appears normal and is non-tender to palpation, there is no CVA tenderness; no midline spinal tenderness, step-off or deformity EXT: Normal ROM in all joints; non-tender to palpation; no edema; normal capillary refill; no cyanosis, no bony tenderness or bony deformity of patient's extremities, no joint effusion, compartments are soft, extremities are warm and well-perfused, no ecchymosis SKIN: Normal color for age and race; warm NEURO: Moves all extremities equally, normal sensation diffusely, speech is slow in nature but clear without dysarthria, cranial nerves II through XII intact PSYCH: The patient's mood and manner are appropriate. Grooming and personal hygiene are appropriate.  MEDICAL DECISION MAKING: Patient here with a fall out of bed.  She is oriented and neurologically intact.  She is hemodynamically stable.  Will  obtain CT of her head, face and cervical spine.  Will give Tylenol for pain.  Reports that her tetanus vaccination is up-to-date.  I have repaired the laceration on her scalp with staples.  No other sign of injury on exam.  ED PROGRESS: Patient CT scan showed left nasal bone fracture with some displacement.  We will give ENT follow-up information.  I recommended continuing Tylenol for pain control.  I do not feel this time she needs narcotics as Tylenol has controlled her pain well and I feel narcotics would put her at risk for increased falls from her baseline.  Patient comfortable with this plan.  Will discharge back to nursing facility.  At this time, I do not feel there is any life-threatening condition present. I have reviewed and discussed all results (EKG, imaging, lab, urine as appropriate) and exam findings with patient/family. I have reviewed nursing notes and appropriate previous records.  I feel the patient is safe to be discharged home without further emergent workup and can continue workup as an outpatient as needed. Discussed usual and customary return precautions. Patient/family verbalize understanding and are comfortable  with this plan.  Outpatient follow-up has been provided if needed. All questions have been answered.    LACERATION REPAIR Performed by: Rochele Raring Authorized by: Rochele Raring Consent: Verbal consent obtained. Risks and benefits: risks, benefits and alternatives were discussed Consent given by: patient Patient identity confirmed: provided demographic data Prepped and Draped in normal sterile fashion Wound explored  Laceration Location: Scalp  Laceration Length: 4cm  No Foreign Bodies seen or palpated  Anesthesia: local infiltration  Local anesthetic: Patient declined  Irrigation method: syringe Amount of cleaning: standard  Skin closure: Simple  Number of sutures: 3 staples  Technique: Area irrigated and cleaned by nursing staff with saline.   Closed using 3 staples.  Antibiotic ointment applied.  Patient tolerance: Patient tolerated the procedure well with no immediate complications.     Aws Shere, Layla Maw, DO 02/28/18 [phone]

## 2018-03-01 DIAGNOSIS — F0390 Unspecified dementia without behavioral disturbance: Secondary | ICD-10-CM | POA: Diagnosis not present

## 2018-03-02 DIAGNOSIS — F0390 Unspecified dementia without behavioral disturbance: Secondary | ICD-10-CM | POA: Diagnosis not present

## 2018-03-03 ENCOUNTER — Other Ambulatory Visit: Payer: Self-pay | Admitting: *Deleted

## 2018-03-03 DIAGNOSIS — B351 Tinea unguium: Secondary | ICD-10-CM | POA: Diagnosis not present

## 2018-03-03 DIAGNOSIS — Q845 Enlarged and hypertrophic nails: Secondary | ICD-10-CM | POA: Diagnosis not present

## 2018-03-03 DIAGNOSIS — I739 Peripheral vascular disease, unspecified: Secondary | ICD-10-CM | POA: Diagnosis not present

## 2018-03-03 DIAGNOSIS — F0390 Unspecified dementia without behavioral disturbance: Secondary | ICD-10-CM | POA: Diagnosis not present

## 2018-03-03 DIAGNOSIS — L603 Nail dystrophy: Secondary | ICD-10-CM | POA: Diagnosis not present

## 2018-03-03 NOTE — Patient Outreach (Signed)
[facility] Care Management  03/03/2018  [name] Jan 02, 1936 [id number]   Telephone Screen  Referral Date:  3/18/219 Referral Source:  [facility] Reason for Referral:  6 or more ED visits in the past 6 months Insurance:  [employer] and [employer]   Outreach Attempt:  Per ED notes, patient resident [facility] [address].  RN Health Coach made telephone outreach to [facility].  Spoke with Olivia Robertson and confirmed patient was permanent resident there.  Olivia Flooraomi also confirms there are nursing and social work staff there to support patient needs.  Plan:  RN Health Coach will notify [facility] Care [facility] Assistant of case closure status related to patient receiving other services at this time.   Olivia LernerFarrah Teddie Curd RN [facility] Care Management  RN Health Coach [phone] [email or web address]

## 2018-03-04 DIAGNOSIS — F411 Generalized anxiety disorder: Secondary | ICD-10-CM | POA: Diagnosis not present

## 2018-03-04 DIAGNOSIS — F322 Major depressive disorder, single episode, severe without psychotic features: Secondary | ICD-10-CM | POA: Diagnosis not present

## 2018-03-04 DIAGNOSIS — F0390 Unspecified dementia without behavioral disturbance: Secondary | ICD-10-CM | POA: Diagnosis not present

## 2018-03-05 DIAGNOSIS — F0390 Unspecified dementia without behavioral disturbance: Secondary | ICD-10-CM | POA: Diagnosis not present

## 2018-03-06 DIAGNOSIS — F0390 Unspecified dementia without behavioral disturbance: Secondary | ICD-10-CM | POA: Diagnosis not present

## 2018-03-06 DIAGNOSIS — F0150 Vascular dementia without behavioral disturbance: Secondary | ICD-10-CM | POA: Diagnosis not present

## 2018-03-06 DIAGNOSIS — F322 Major depressive disorder, single episode, severe without psychotic features: Secondary | ICD-10-CM | POA: Diagnosis not present

## 2018-03-06 DIAGNOSIS — F064 Anxiety disorder due to known physiological condition: Secondary | ICD-10-CM | POA: Diagnosis not present

## 2018-03-06 DIAGNOSIS — F331 Major depressive disorder, recurrent, moderate: Secondary | ICD-10-CM | POA: Diagnosis not present

## 2018-03-06 DIAGNOSIS — F411 Generalized anxiety disorder: Secondary | ICD-10-CM | POA: Diagnosis not present

## 2018-03-07 DIAGNOSIS — N390 Urinary tract infection, site not specified: Secondary | ICD-10-CM | POA: Diagnosis not present

## 2018-03-07 DIAGNOSIS — R159 Full incontinence of feces: Secondary | ICD-10-CM | POA: Diagnosis not present

## 2018-03-07 DIAGNOSIS — F0390 Unspecified dementia without behavioral disturbance: Secondary | ICD-10-CM | POA: Diagnosis not present

## 2018-03-07 DIAGNOSIS — R2681 Unsteadiness on feet: Secondary | ICD-10-CM | POA: Diagnosis not present

## 2018-03-07 DIAGNOSIS — S0181XA Laceration without foreign body of other part of head, initial encounter: Secondary | ICD-10-CM | POA: Diagnosis not present

## 2018-03-07 DIAGNOSIS — E785 Hyperlipidemia, unspecified: Secondary | ICD-10-CM | POA: Diagnosis not present

## 2018-03-07 DIAGNOSIS — M545 Low back pain: Secondary | ICD-10-CM | POA: Diagnosis not present

## 2018-03-07 DIAGNOSIS — R296 Repeated falls: Secondary | ICD-10-CM | POA: Diagnosis not present

## 2018-03-07 DIAGNOSIS — Z4802 Encounter for removal of sutures: Secondary | ICD-10-CM | POA: Diagnosis not present

## 2018-03-07 DIAGNOSIS — R32 Unspecified urinary incontinence: Secondary | ICD-10-CM | POA: Diagnosis not present

## 2018-03-08 DIAGNOSIS — F0390 Unspecified dementia without behavioral disturbance: Secondary | ICD-10-CM | POA: Diagnosis not present

## 2018-03-09 DIAGNOSIS — F0390 Unspecified dementia without behavioral disturbance: Secondary | ICD-10-CM | POA: Diagnosis not present

## 2018-03-10 DIAGNOSIS — F419 Anxiety disorder, unspecified: Secondary | ICD-10-CM | POA: Diagnosis not present

## 2018-03-10 DIAGNOSIS — E440 Moderate protein-calorie malnutrition: Secondary | ICD-10-CM | POA: Diagnosis not present

## 2018-03-10 DIAGNOSIS — F0390 Unspecified dementia without behavioral disturbance: Secondary | ICD-10-CM | POA: Diagnosis not present

## 2018-03-10 DIAGNOSIS — M6281 Muscle weakness (generalized): Secondary | ICD-10-CM | POA: Diagnosis not present

## 2018-03-10 DIAGNOSIS — H547 Unspecified visual loss: Secondary | ICD-10-CM | POA: Diagnosis not present

## 2018-03-10 DIAGNOSIS — Z8673 Personal history of transient ischemic attack (TIA), and cerebral infarction without residual deficits: Secondary | ICD-10-CM | POA: Diagnosis not present

## 2018-03-11 DIAGNOSIS — F322 Major depressive disorder, single episode, severe without psychotic features: Secondary | ICD-10-CM | POA: Diagnosis not present

## 2018-03-11 DIAGNOSIS — F0390 Unspecified dementia without behavioral disturbance: Secondary | ICD-10-CM | POA: Diagnosis not present

## 2018-03-11 DIAGNOSIS — F411 Generalized anxiety disorder: Secondary | ICD-10-CM | POA: Diagnosis not present

## 2018-03-12 DIAGNOSIS — F0390 Unspecified dementia without behavioral disturbance: Secondary | ICD-10-CM | POA: Diagnosis not present

## 2018-03-13 DIAGNOSIS — E440 Moderate protein-calorie malnutrition: Secondary | ICD-10-CM | POA: Diagnosis not present

## 2018-03-13 DIAGNOSIS — Z8673 Personal history of transient ischemic attack (TIA), and cerebral infarction without residual deficits: Secondary | ICD-10-CM | POA: Diagnosis not present

## 2018-03-13 DIAGNOSIS — M6281 Muscle weakness (generalized): Secondary | ICD-10-CM | POA: Diagnosis not present

## 2018-03-13 DIAGNOSIS — F0390 Unspecified dementia without behavioral disturbance: Secondary | ICD-10-CM | POA: Diagnosis not present

## 2018-03-13 DIAGNOSIS — F419 Anxiety disorder, unspecified: Secondary | ICD-10-CM | POA: Diagnosis not present

## 2018-03-13 DIAGNOSIS — H547 Unspecified visual loss: Secondary | ICD-10-CM | POA: Diagnosis not present

## 2018-03-14 DIAGNOSIS — F0390 Unspecified dementia without behavioral disturbance: Secondary | ICD-10-CM | POA: Diagnosis not present

## 2018-03-15 DIAGNOSIS — F0390 Unspecified dementia without behavioral disturbance: Secondary | ICD-10-CM | POA: Diagnosis not present

## 2018-03-16 DIAGNOSIS — F0390 Unspecified dementia without behavioral disturbance: Secondary | ICD-10-CM | POA: Diagnosis not present

## 2018-03-17 DIAGNOSIS — H547 Unspecified visual loss: Secondary | ICD-10-CM | POA: Diagnosis not present

## 2018-03-17 DIAGNOSIS — F419 Anxiety disorder, unspecified: Secondary | ICD-10-CM | POA: Diagnosis not present

## 2018-03-17 DIAGNOSIS — Z8673 Personal history of transient ischemic attack (TIA), and cerebral infarction without residual deficits: Secondary | ICD-10-CM | POA: Diagnosis not present

## 2018-03-17 DIAGNOSIS — M6281 Muscle weakness (generalized): Secondary | ICD-10-CM | POA: Diagnosis not present

## 2018-03-17 DIAGNOSIS — F0390 Unspecified dementia without behavioral disturbance: Secondary | ICD-10-CM | POA: Diagnosis not present

## 2018-03-17 DIAGNOSIS — E440 Moderate protein-calorie malnutrition: Secondary | ICD-10-CM | POA: Diagnosis not present

## 2018-03-18 DIAGNOSIS — H547 Unspecified visual loss: Secondary | ICD-10-CM | POA: Diagnosis not present

## 2018-03-18 DIAGNOSIS — Z8673 Personal history of transient ischemic attack (TIA), and cerebral infarction without residual deficits: Secondary | ICD-10-CM | POA: Diagnosis not present

## 2018-03-18 DIAGNOSIS — F0390 Unspecified dementia without behavioral disturbance: Secondary | ICD-10-CM | POA: Diagnosis not present

## 2018-03-18 DIAGNOSIS — M6281 Muscle weakness (generalized): Secondary | ICD-10-CM | POA: Diagnosis not present

## 2018-03-18 DIAGNOSIS — E440 Moderate protein-calorie malnutrition: Secondary | ICD-10-CM | POA: Diagnosis not present

## 2018-03-18 DIAGNOSIS — F419 Anxiety disorder, unspecified: Secondary | ICD-10-CM | POA: Diagnosis not present

## 2018-03-19 DIAGNOSIS — F419 Anxiety disorder, unspecified: Secondary | ICD-10-CM | POA: Diagnosis not present

## 2018-03-19 DIAGNOSIS — E440 Moderate protein-calorie malnutrition: Secondary | ICD-10-CM | POA: Diagnosis not present

## 2018-03-19 DIAGNOSIS — H547 Unspecified visual loss: Secondary | ICD-10-CM | POA: Diagnosis not present

## 2018-03-19 DIAGNOSIS — E785 Hyperlipidemia, unspecified: Secondary | ICD-10-CM | POA: Diagnosis not present

## 2018-03-19 DIAGNOSIS — G459 Transient cerebral ischemic attack, unspecified: Secondary | ICD-10-CM | POA: Diagnosis not present

## 2018-03-19 DIAGNOSIS — M6281 Muscle weakness (generalized): Secondary | ICD-10-CM | POA: Diagnosis not present

## 2018-03-19 DIAGNOSIS — R55 Syncope and collapse: Secondary | ICD-10-CM | POA: Diagnosis not present

## 2018-03-19 DIAGNOSIS — Z8673 Personal history of transient ischemic attack (TIA), and cerebral infarction without residual deficits: Secondary | ICD-10-CM | POA: Diagnosis not present

## 2018-03-19 DIAGNOSIS — I739 Peripheral vascular disease, unspecified: Secondary | ICD-10-CM | POA: Diagnosis not present

## 2018-03-19 DIAGNOSIS — F0390 Unspecified dementia without behavioral disturbance: Secondary | ICD-10-CM | POA: Diagnosis not present

## 2018-03-20 DIAGNOSIS — E440 Moderate protein-calorie malnutrition: Secondary | ICD-10-CM | POA: Diagnosis not present

## 2018-03-20 DIAGNOSIS — M6281 Muscle weakness (generalized): Secondary | ICD-10-CM | POA: Diagnosis not present

## 2018-03-20 DIAGNOSIS — F419 Anxiety disorder, unspecified: Secondary | ICD-10-CM | POA: Diagnosis not present

## 2018-03-20 DIAGNOSIS — H547 Unspecified visual loss: Secondary | ICD-10-CM | POA: Diagnosis not present

## 2018-03-20 DIAGNOSIS — F0390 Unspecified dementia without behavioral disturbance: Secondary | ICD-10-CM | POA: Diagnosis not present

## 2018-03-20 DIAGNOSIS — Z8673 Personal history of transient ischemic attack (TIA), and cerebral infarction without residual deficits: Secondary | ICD-10-CM | POA: Diagnosis not present

## 2018-03-21 DIAGNOSIS — F0390 Unspecified dementia without behavioral disturbance: Secondary | ICD-10-CM | POA: Diagnosis not present

## 2018-03-22 DIAGNOSIS — R2689 Other abnormalities of gait and mobility: Secondary | ICD-10-CM | POA: Diagnosis not present

## 2018-03-22 DIAGNOSIS — F0390 Unspecified dementia without behavioral disturbance: Secondary | ICD-10-CM | POA: Diagnosis not present

## 2018-03-23 DIAGNOSIS — F0390 Unspecified dementia without behavioral disturbance: Secondary | ICD-10-CM | POA: Diagnosis not present

## 2018-03-24 DIAGNOSIS — F0390 Unspecified dementia without behavioral disturbance: Secondary | ICD-10-CM | POA: Diagnosis not present

## 2018-03-25 DIAGNOSIS — Z8673 Personal history of transient ischemic attack (TIA), and cerebral infarction without residual deficits: Secondary | ICD-10-CM | POA: Diagnosis not present

## 2018-03-25 DIAGNOSIS — E440 Moderate protein-calorie malnutrition: Secondary | ICD-10-CM | POA: Diagnosis not present

## 2018-03-25 DIAGNOSIS — F419 Anxiety disorder, unspecified: Secondary | ICD-10-CM | POA: Diagnosis not present

## 2018-03-25 DIAGNOSIS — M6281 Muscle weakness (generalized): Secondary | ICD-10-CM | POA: Diagnosis not present

## 2018-03-25 DIAGNOSIS — H547 Unspecified visual loss: Secondary | ICD-10-CM | POA: Diagnosis not present

## 2018-03-25 DIAGNOSIS — F0390 Unspecified dementia without behavioral disturbance: Secondary | ICD-10-CM | POA: Diagnosis not present

## 2018-03-26 DIAGNOSIS — H547 Unspecified visual loss: Secondary | ICD-10-CM | POA: Diagnosis not present

## 2018-03-26 DIAGNOSIS — F419 Anxiety disorder, unspecified: Secondary | ICD-10-CM | POA: Diagnosis not present

## 2018-03-26 DIAGNOSIS — F0390 Unspecified dementia without behavioral disturbance: Secondary | ICD-10-CM | POA: Diagnosis not present

## 2018-03-26 DIAGNOSIS — Z8673 Personal history of transient ischemic attack (TIA), and cerebral infarction without residual deficits: Secondary | ICD-10-CM | POA: Diagnosis not present

## 2018-03-26 DIAGNOSIS — M6281 Muscle weakness (generalized): Secondary | ICD-10-CM | POA: Diagnosis not present

## 2018-03-26 DIAGNOSIS — E440 Moderate protein-calorie malnutrition: Secondary | ICD-10-CM | POA: Diagnosis not present

## 2018-03-27 DIAGNOSIS — Z8673 Personal history of transient ischemic attack (TIA), and cerebral infarction without residual deficits: Secondary | ICD-10-CM | POA: Diagnosis not present

## 2018-03-27 DIAGNOSIS — F419 Anxiety disorder, unspecified: Secondary | ICD-10-CM | POA: Diagnosis not present

## 2018-03-27 DIAGNOSIS — H547 Unspecified visual loss: Secondary | ICD-10-CM | POA: Diagnosis not present

## 2018-03-27 DIAGNOSIS — F0390 Unspecified dementia without behavioral disturbance: Secondary | ICD-10-CM | POA: Diagnosis not present

## 2018-03-27 DIAGNOSIS — M6281 Muscle weakness (generalized): Secondary | ICD-10-CM | POA: Diagnosis not present

## 2018-03-27 DIAGNOSIS — E440 Moderate protein-calorie malnutrition: Secondary | ICD-10-CM | POA: Diagnosis not present

## 2018-03-28 DIAGNOSIS — Z8673 Personal history of transient ischemic attack (TIA), and cerebral infarction without residual deficits: Secondary | ICD-10-CM | POA: Diagnosis not present

## 2018-03-28 DIAGNOSIS — M6281 Muscle weakness (generalized): Secondary | ICD-10-CM | POA: Diagnosis not present

## 2018-03-28 DIAGNOSIS — E440 Moderate protein-calorie malnutrition: Secondary | ICD-10-CM | POA: Diagnosis not present

## 2018-03-28 DIAGNOSIS — F0390 Unspecified dementia without behavioral disturbance: Secondary | ICD-10-CM | POA: Diagnosis not present

## 2018-03-28 DIAGNOSIS — F419 Anxiety disorder, unspecified: Secondary | ICD-10-CM | POA: Diagnosis not present

## 2018-03-28 DIAGNOSIS — H547 Unspecified visual loss: Secondary | ICD-10-CM | POA: Diagnosis not present

## 2018-03-29 DIAGNOSIS — F0390 Unspecified dementia without behavioral disturbance: Secondary | ICD-10-CM | POA: Diagnosis not present

## 2018-03-30 DIAGNOSIS — F0390 Unspecified dementia without behavioral disturbance: Secondary | ICD-10-CM | POA: Diagnosis not present

## 2018-03-31 DIAGNOSIS — F0390 Unspecified dementia without behavioral disturbance: Secondary | ICD-10-CM | POA: Diagnosis not present

## 2018-03-31 DIAGNOSIS — M6281 Muscle weakness (generalized): Secondary | ICD-10-CM | POA: Diagnosis not present

## 2018-03-31 DIAGNOSIS — F419 Anxiety disorder, unspecified: Secondary | ICD-10-CM | POA: Diagnosis not present

## 2018-03-31 DIAGNOSIS — Z8673 Personal history of transient ischemic attack (TIA), and cerebral infarction without residual deficits: Secondary | ICD-10-CM | POA: Diagnosis not present

## 2018-03-31 DIAGNOSIS — E440 Moderate protein-calorie malnutrition: Secondary | ICD-10-CM | POA: Diagnosis not present

## 2018-03-31 DIAGNOSIS — H547 Unspecified visual loss: Secondary | ICD-10-CM | POA: Diagnosis not present

## 2018-04-01 DIAGNOSIS — Z8673 Personal history of transient ischemic attack (TIA), and cerebral infarction without residual deficits: Secondary | ICD-10-CM | POA: Diagnosis not present

## 2018-04-01 DIAGNOSIS — F322 Major depressive disorder, single episode, severe without psychotic features: Secondary | ICD-10-CM | POA: Diagnosis not present

## 2018-04-01 DIAGNOSIS — F419 Anxiety disorder, unspecified: Secondary | ICD-10-CM | POA: Diagnosis not present

## 2018-04-01 DIAGNOSIS — M6281 Muscle weakness (generalized): Secondary | ICD-10-CM | POA: Diagnosis not present

## 2018-04-01 DIAGNOSIS — F411 Generalized anxiety disorder: Secondary | ICD-10-CM | POA: Diagnosis not present

## 2018-04-01 DIAGNOSIS — F0390 Unspecified dementia without behavioral disturbance: Secondary | ICD-10-CM | POA: Diagnosis not present

## 2018-04-01 DIAGNOSIS — H547 Unspecified visual loss: Secondary | ICD-10-CM | POA: Diagnosis not present

## 2018-04-01 DIAGNOSIS — E440 Moderate protein-calorie malnutrition: Secondary | ICD-10-CM | POA: Diagnosis not present

## 2018-04-02 DIAGNOSIS — M6281 Muscle weakness (generalized): Secondary | ICD-10-CM | POA: Diagnosis not present

## 2018-04-02 DIAGNOSIS — F0390 Unspecified dementia without behavioral disturbance: Secondary | ICD-10-CM | POA: Diagnosis not present

## 2018-04-02 DIAGNOSIS — F419 Anxiety disorder, unspecified: Secondary | ICD-10-CM | POA: Diagnosis not present

## 2018-04-02 DIAGNOSIS — Z8673 Personal history of transient ischemic attack (TIA), and cerebral infarction without residual deficits: Secondary | ICD-10-CM | POA: Diagnosis not present

## 2018-04-02 DIAGNOSIS — H547 Unspecified visual loss: Secondary | ICD-10-CM | POA: Diagnosis not present

## 2018-04-02 DIAGNOSIS — E440 Moderate protein-calorie malnutrition: Secondary | ICD-10-CM | POA: Diagnosis not present

## 2018-04-03 DIAGNOSIS — F0390 Unspecified dementia without behavioral disturbance: Secondary | ICD-10-CM | POA: Diagnosis not present

## 2018-04-04 DIAGNOSIS — F0390 Unspecified dementia without behavioral disturbance: Secondary | ICD-10-CM | POA: Diagnosis not present

## 2018-04-05 DIAGNOSIS — F0390 Unspecified dementia without behavioral disturbance: Secondary | ICD-10-CM | POA: Diagnosis not present

## 2018-04-06 DIAGNOSIS — F0390 Unspecified dementia without behavioral disturbance: Secondary | ICD-10-CM | POA: Diagnosis not present

## 2018-04-07 DIAGNOSIS — Z8673 Personal history of transient ischemic attack (TIA), and cerebral infarction without residual deficits: Secondary | ICD-10-CM | POA: Diagnosis not present

## 2018-04-07 DIAGNOSIS — F419 Anxiety disorder, unspecified: Secondary | ICD-10-CM | POA: Diagnosis not present

## 2018-04-07 DIAGNOSIS — H547 Unspecified visual loss: Secondary | ICD-10-CM | POA: Diagnosis not present

## 2018-04-07 DIAGNOSIS — F0390 Unspecified dementia without behavioral disturbance: Secondary | ICD-10-CM | POA: Diagnosis not present

## 2018-04-07 DIAGNOSIS — E440 Moderate protein-calorie malnutrition: Secondary | ICD-10-CM | POA: Diagnosis not present

## 2018-04-07 DIAGNOSIS — M6281 Muscle weakness (generalized): Secondary | ICD-10-CM | POA: Diagnosis not present

## 2018-04-08 DIAGNOSIS — F419 Anxiety disorder, unspecified: Secondary | ICD-10-CM | POA: Diagnosis not present

## 2018-04-08 DIAGNOSIS — H547 Unspecified visual loss: Secondary | ICD-10-CM | POA: Diagnosis not present

## 2018-04-08 DIAGNOSIS — R159 Full incontinence of feces: Secondary | ICD-10-CM | POA: Diagnosis not present

## 2018-04-08 DIAGNOSIS — M6281 Muscle weakness (generalized): Secondary | ICD-10-CM | POA: Diagnosis not present

## 2018-04-08 DIAGNOSIS — R32 Unspecified urinary incontinence: Secondary | ICD-10-CM | POA: Diagnosis not present

## 2018-04-08 DIAGNOSIS — Z8673 Personal history of transient ischemic attack (TIA), and cerebral infarction without residual deficits: Secondary | ICD-10-CM | POA: Diagnosis not present

## 2018-04-08 DIAGNOSIS — F0390 Unspecified dementia without behavioral disturbance: Secondary | ICD-10-CM | POA: Diagnosis not present

## 2018-04-08 DIAGNOSIS — E440 Moderate protein-calorie malnutrition: Secondary | ICD-10-CM | POA: Diagnosis not present

## 2018-04-09 DIAGNOSIS — F419 Anxiety disorder, unspecified: Secondary | ICD-10-CM | POA: Diagnosis not present

## 2018-04-09 DIAGNOSIS — Z8673 Personal history of transient ischemic attack (TIA), and cerebral infarction without residual deficits: Secondary | ICD-10-CM | POA: Diagnosis not present

## 2018-04-09 DIAGNOSIS — F064 Anxiety disorder due to known physiological condition: Secondary | ICD-10-CM | POA: Diagnosis not present

## 2018-04-09 DIAGNOSIS — H547 Unspecified visual loss: Secondary | ICD-10-CM | POA: Diagnosis not present

## 2018-04-09 DIAGNOSIS — E440 Moderate protein-calorie malnutrition: Secondary | ICD-10-CM | POA: Diagnosis not present

## 2018-04-09 DIAGNOSIS — M6281 Muscle weakness (generalized): Secondary | ICD-10-CM | POA: Diagnosis not present

## 2018-04-09 DIAGNOSIS — F0390 Unspecified dementia without behavioral disturbance: Secondary | ICD-10-CM | POA: Diagnosis not present

## 2018-04-09 DIAGNOSIS — F331 Major depressive disorder, recurrent, moderate: Secondary | ICD-10-CM | POA: Diagnosis not present

## 2018-04-09 DIAGNOSIS — F0150 Vascular dementia without behavioral disturbance: Secondary | ICD-10-CM | POA: Diagnosis not present

## 2018-04-10 DIAGNOSIS — F0390 Unspecified dementia without behavioral disturbance: Secondary | ICD-10-CM | POA: Diagnosis not present

## 2018-04-11 DIAGNOSIS — N390 Urinary tract infection, site not specified: Secondary | ICD-10-CM | POA: Diagnosis not present

## 2018-04-11 DIAGNOSIS — M545 Low back pain: Secondary | ICD-10-CM | POA: Diagnosis not present

## 2018-04-11 DIAGNOSIS — Z4802 Encounter for removal of sutures: Secondary | ICD-10-CM | POA: Diagnosis not present

## 2018-04-11 DIAGNOSIS — E785 Hyperlipidemia, unspecified: Secondary | ICD-10-CM | POA: Diagnosis not present

## 2018-04-11 DIAGNOSIS — L309 Dermatitis, unspecified: Secondary | ICD-10-CM | POA: Diagnosis not present

## 2018-04-11 DIAGNOSIS — R634 Abnormal weight loss: Secondary | ICD-10-CM | POA: Diagnosis not present

## 2018-04-11 DIAGNOSIS — R296 Repeated falls: Secondary | ICD-10-CM | POA: Diagnosis not present

## 2018-04-11 DIAGNOSIS — R2681 Unsteadiness on feet: Secondary | ICD-10-CM | POA: Diagnosis not present

## 2018-04-11 DIAGNOSIS — F0390 Unspecified dementia without behavioral disturbance: Secondary | ICD-10-CM | POA: Diagnosis not present

## 2018-04-11 DIAGNOSIS — S0181XA Laceration without foreign body of other part of head, initial encounter: Secondary | ICD-10-CM | POA: Diagnosis not present

## 2018-04-12 DIAGNOSIS — F0390 Unspecified dementia without behavioral disturbance: Secondary | ICD-10-CM | POA: Diagnosis not present

## 2018-04-13 DIAGNOSIS — F0390 Unspecified dementia without behavioral disturbance: Secondary | ICD-10-CM | POA: Diagnosis not present

## 2018-04-14 DIAGNOSIS — F0390 Unspecified dementia without behavioral disturbance: Secondary | ICD-10-CM | POA: Diagnosis not present

## 2018-04-15 DIAGNOSIS — E440 Moderate protein-calorie malnutrition: Secondary | ICD-10-CM | POA: Diagnosis not present

## 2018-04-15 DIAGNOSIS — F419 Anxiety disorder, unspecified: Secondary | ICD-10-CM | POA: Diagnosis not present

## 2018-04-15 DIAGNOSIS — H547 Unspecified visual loss: Secondary | ICD-10-CM | POA: Diagnosis not present

## 2018-04-15 DIAGNOSIS — F411 Generalized anxiety disorder: Secondary | ICD-10-CM | POA: Diagnosis not present

## 2018-04-15 DIAGNOSIS — M6281 Muscle weakness (generalized): Secondary | ICD-10-CM | POA: Diagnosis not present

## 2018-04-15 DIAGNOSIS — F322 Major depressive disorder, single episode, severe without psychotic features: Secondary | ICD-10-CM | POA: Diagnosis not present

## 2018-04-15 DIAGNOSIS — Z8673 Personal history of transient ischemic attack (TIA), and cerebral infarction without residual deficits: Secondary | ICD-10-CM | POA: Diagnosis not present

## 2018-04-15 DIAGNOSIS — F0390 Unspecified dementia without behavioral disturbance: Secondary | ICD-10-CM | POA: Diagnosis not present

## 2018-04-16 DIAGNOSIS — F0390 Unspecified dementia without behavioral disturbance: Secondary | ICD-10-CM | POA: Diagnosis not present

## 2018-04-17 DIAGNOSIS — F0390 Unspecified dementia without behavioral disturbance: Secondary | ICD-10-CM | POA: Diagnosis not present

## 2018-04-17 DIAGNOSIS — E440 Moderate protein-calorie malnutrition: Secondary | ICD-10-CM | POA: Diagnosis not present

## 2018-04-17 DIAGNOSIS — H547 Unspecified visual loss: Secondary | ICD-10-CM | POA: Diagnosis not present

## 2018-04-17 DIAGNOSIS — M6281 Muscle weakness (generalized): Secondary | ICD-10-CM | POA: Diagnosis not present

## 2018-04-17 DIAGNOSIS — F419 Anxiety disorder, unspecified: Secondary | ICD-10-CM | POA: Diagnosis not present

## 2018-04-17 DIAGNOSIS — Z8673 Personal history of transient ischemic attack (TIA), and cerebral infarction without residual deficits: Secondary | ICD-10-CM | POA: Diagnosis not present

## 2018-04-18 DIAGNOSIS — F0390 Unspecified dementia without behavioral disturbance: Secondary | ICD-10-CM | POA: Diagnosis not present

## 2018-04-19 DIAGNOSIS — F0390 Unspecified dementia without behavioral disturbance: Secondary | ICD-10-CM | POA: Diagnosis not present

## 2018-04-20 DIAGNOSIS — F0390 Unspecified dementia without behavioral disturbance: Secondary | ICD-10-CM | POA: Diagnosis not present

## 2018-04-21 DIAGNOSIS — F0390 Unspecified dementia without behavioral disturbance: Secondary | ICD-10-CM | POA: Diagnosis not present

## 2018-04-21 DIAGNOSIS — R2689 Other abnormalities of gait and mobility: Secondary | ICD-10-CM | POA: Diagnosis not present

## 2018-04-22 DIAGNOSIS — E440 Moderate protein-calorie malnutrition: Secondary | ICD-10-CM | POA: Diagnosis not present

## 2018-04-22 DIAGNOSIS — M6281 Muscle weakness (generalized): Secondary | ICD-10-CM | POA: Diagnosis not present

## 2018-04-22 DIAGNOSIS — H547 Unspecified visual loss: Secondary | ICD-10-CM | POA: Diagnosis not present

## 2018-04-22 DIAGNOSIS — F419 Anxiety disorder, unspecified: Secondary | ICD-10-CM | POA: Diagnosis not present

## 2018-04-22 DIAGNOSIS — F0390 Unspecified dementia without behavioral disturbance: Secondary | ICD-10-CM | POA: Diagnosis not present

## 2018-04-22 DIAGNOSIS — Z8673 Personal history of transient ischemic attack (TIA), and cerebral infarction without residual deficits: Secondary | ICD-10-CM | POA: Diagnosis not present

## 2018-04-23 DIAGNOSIS — F0390 Unspecified dementia without behavioral disturbance: Secondary | ICD-10-CM | POA: Diagnosis not present

## 2018-04-24 DIAGNOSIS — F0390 Unspecified dementia without behavioral disturbance: Secondary | ICD-10-CM | POA: Diagnosis not present

## 2018-04-25 DIAGNOSIS — F0390 Unspecified dementia without behavioral disturbance: Secondary | ICD-10-CM | POA: Diagnosis not present

## 2018-04-26 DIAGNOSIS — F0390 Unspecified dementia without behavioral disturbance: Secondary | ICD-10-CM | POA: Diagnosis not present

## 2018-04-27 DIAGNOSIS — F0390 Unspecified dementia without behavioral disturbance: Secondary | ICD-10-CM | POA: Diagnosis not present

## 2018-04-28 DIAGNOSIS — F419 Anxiety disorder, unspecified: Secondary | ICD-10-CM | POA: Diagnosis not present

## 2018-04-28 DIAGNOSIS — Z8673 Personal history of transient ischemic attack (TIA), and cerebral infarction without residual deficits: Secondary | ICD-10-CM | POA: Diagnosis not present

## 2018-04-28 DIAGNOSIS — F0390 Unspecified dementia without behavioral disturbance: Secondary | ICD-10-CM | POA: Diagnosis not present

## 2018-04-28 DIAGNOSIS — H547 Unspecified visual loss: Secondary | ICD-10-CM | POA: Diagnosis not present

## 2018-04-28 DIAGNOSIS — M6281 Muscle weakness (generalized): Secondary | ICD-10-CM | POA: Diagnosis not present

## 2018-04-28 DIAGNOSIS — E440 Moderate protein-calorie malnutrition: Secondary | ICD-10-CM | POA: Diagnosis not present

## 2018-04-29 DIAGNOSIS — F411 Generalized anxiety disorder: Secondary | ICD-10-CM | POA: Diagnosis not present

## 2018-04-29 DIAGNOSIS — F322 Major depressive disorder, single episode, severe without psychotic features: Secondary | ICD-10-CM | POA: Diagnosis not present

## 2018-04-29 DIAGNOSIS — F0390 Unspecified dementia without behavioral disturbance: Secondary | ICD-10-CM | POA: Diagnosis not present

## 2018-04-30 DIAGNOSIS — F0390 Unspecified dementia without behavioral disturbance: Secondary | ICD-10-CM | POA: Diagnosis not present

## 2018-05-01 DIAGNOSIS — F0390 Unspecified dementia without behavioral disturbance: Secondary | ICD-10-CM | POA: Diagnosis not present

## 2018-05-02 DIAGNOSIS — F331 Major depressive disorder, recurrent, moderate: Secondary | ICD-10-CM | POA: Diagnosis not present

## 2018-05-02 DIAGNOSIS — F0390 Unspecified dementia without behavioral disturbance: Secondary | ICD-10-CM | POA: Diagnosis not present

## 2018-05-02 DIAGNOSIS — F064 Anxiety disorder due to known physiological condition: Secondary | ICD-10-CM | POA: Diagnosis not present

## 2018-05-02 DIAGNOSIS — E440 Moderate protein-calorie malnutrition: Secondary | ICD-10-CM | POA: Diagnosis not present

## 2018-05-02 DIAGNOSIS — M6281 Muscle weakness (generalized): Secondary | ICD-10-CM | POA: Diagnosis not present

## 2018-05-02 DIAGNOSIS — F419 Anxiety disorder, unspecified: Secondary | ICD-10-CM | POA: Diagnosis not present

## 2018-05-02 DIAGNOSIS — Z8673 Personal history of transient ischemic attack (TIA), and cerebral infarction without residual deficits: Secondary | ICD-10-CM | POA: Diagnosis not present

## 2018-05-02 DIAGNOSIS — H547 Unspecified visual loss: Secondary | ICD-10-CM | POA: Diagnosis not present

## 2018-05-02 DIAGNOSIS — F0150 Vascular dementia without behavioral disturbance: Secondary | ICD-10-CM | POA: Diagnosis not present

## 2018-05-03 DIAGNOSIS — F0390 Unspecified dementia without behavioral disturbance: Secondary | ICD-10-CM | POA: Diagnosis not present

## 2018-05-04 DIAGNOSIS — F0390 Unspecified dementia without behavioral disturbance: Secondary | ICD-10-CM | POA: Diagnosis not present

## 2018-05-05 DIAGNOSIS — F0390 Unspecified dementia without behavioral disturbance: Secondary | ICD-10-CM | POA: Diagnosis not present

## 2018-05-06 DIAGNOSIS — M6281 Muscle weakness (generalized): Secondary | ICD-10-CM | POA: Diagnosis not present

## 2018-05-06 DIAGNOSIS — F0390 Unspecified dementia without behavioral disturbance: Secondary | ICD-10-CM | POA: Diagnosis not present

## 2018-05-06 DIAGNOSIS — E440 Moderate protein-calorie malnutrition: Secondary | ICD-10-CM | POA: Diagnosis not present

## 2018-05-06 DIAGNOSIS — F419 Anxiety disorder, unspecified: Secondary | ICD-10-CM | POA: Diagnosis not present

## 2018-05-06 DIAGNOSIS — Z8673 Personal history of transient ischemic attack (TIA), and cerebral infarction without residual deficits: Secondary | ICD-10-CM | POA: Diagnosis not present

## 2018-05-06 DIAGNOSIS — H547 Unspecified visual loss: Secondary | ICD-10-CM | POA: Diagnosis not present

## 2018-05-07 DIAGNOSIS — H547 Unspecified visual loss: Secondary | ICD-10-CM | POA: Diagnosis not present

## 2018-05-07 DIAGNOSIS — Z8673 Personal history of transient ischemic attack (TIA), and cerebral infarction without residual deficits: Secondary | ICD-10-CM | POA: Diagnosis not present

## 2018-05-07 DIAGNOSIS — F0390 Unspecified dementia without behavioral disturbance: Secondary | ICD-10-CM | POA: Diagnosis not present

## 2018-05-07 DIAGNOSIS — M6281 Muscle weakness (generalized): Secondary | ICD-10-CM | POA: Diagnosis not present

## 2018-05-07 DIAGNOSIS — F419 Anxiety disorder, unspecified: Secondary | ICD-10-CM | POA: Diagnosis not present

## 2018-05-07 DIAGNOSIS — E440 Moderate protein-calorie malnutrition: Secondary | ICD-10-CM | POA: Diagnosis not present

## 2018-05-08 DIAGNOSIS — F0390 Unspecified dementia without behavioral disturbance: Secondary | ICD-10-CM | POA: Diagnosis not present

## 2018-05-09 DIAGNOSIS — E785 Hyperlipidemia, unspecified: Secondary | ICD-10-CM | POA: Diagnosis not present

## 2018-05-09 DIAGNOSIS — G459 Transient cerebral ischemic attack, unspecified: Secondary | ICD-10-CM | POA: Diagnosis not present

## 2018-05-09 DIAGNOSIS — R2681 Unsteadiness on feet: Secondary | ICD-10-CM | POA: Diagnosis not present

## 2018-05-09 DIAGNOSIS — R296 Repeated falls: Secondary | ICD-10-CM | POA: Diagnosis not present

## 2018-05-09 DIAGNOSIS — M545 Low back pain: Secondary | ICD-10-CM | POA: Diagnosis not present

## 2018-05-09 DIAGNOSIS — R32 Unspecified urinary incontinence: Secondary | ICD-10-CM | POA: Diagnosis not present

## 2018-05-09 DIAGNOSIS — R634 Abnormal weight loss: Secondary | ICD-10-CM | POA: Diagnosis not present

## 2018-05-09 DIAGNOSIS — R159 Full incontinence of feces: Secondary | ICD-10-CM | POA: Diagnosis not present

## 2018-05-09 DIAGNOSIS — S0181XA Laceration without foreign body of other part of head, initial encounter: Secondary | ICD-10-CM | POA: Diagnosis not present

## 2018-05-09 DIAGNOSIS — L309 Dermatitis, unspecified: Secondary | ICD-10-CM | POA: Diagnosis not present

## 2018-05-09 DIAGNOSIS — Z4802 Encounter for removal of sutures: Secondary | ICD-10-CM | POA: Diagnosis not present

## 2018-05-09 DIAGNOSIS — F0390 Unspecified dementia without behavioral disturbance: Secondary | ICD-10-CM | POA: Diagnosis not present

## 2018-05-10 DIAGNOSIS — F0390 Unspecified dementia without behavioral disturbance: Secondary | ICD-10-CM | POA: Diagnosis not present

## 2018-05-11 DIAGNOSIS — F0390 Unspecified dementia without behavioral disturbance: Secondary | ICD-10-CM | POA: Diagnosis not present

## 2018-05-12 DIAGNOSIS — F0390 Unspecified dementia without behavioral disturbance: Secondary | ICD-10-CM | POA: Diagnosis not present

## 2018-05-13 DIAGNOSIS — F411 Generalized anxiety disorder: Secondary | ICD-10-CM | POA: Diagnosis not present

## 2018-05-13 DIAGNOSIS — F322 Major depressive disorder, single episode, severe without psychotic features: Secondary | ICD-10-CM | POA: Diagnosis not present

## 2018-05-13 DIAGNOSIS — F0390 Unspecified dementia without behavioral disturbance: Secondary | ICD-10-CM | POA: Diagnosis not present

## 2018-05-14 DIAGNOSIS — F0390 Unspecified dementia without behavioral disturbance: Secondary | ICD-10-CM | POA: Diagnosis not present

## 2018-05-15 DIAGNOSIS — F411 Generalized anxiety disorder: Secondary | ICD-10-CM | POA: Diagnosis not present

## 2018-05-15 DIAGNOSIS — F0390 Unspecified dementia without behavioral disturbance: Secondary | ICD-10-CM | POA: Diagnosis not present

## 2018-05-15 DIAGNOSIS — F064 Anxiety disorder due to known physiological condition: Secondary | ICD-10-CM | POA: Diagnosis not present

## 2018-05-16 DIAGNOSIS — F0390 Unspecified dementia without behavioral disturbance: Secondary | ICD-10-CM | POA: Diagnosis not present

## 2018-05-17 DIAGNOSIS — F0390 Unspecified dementia without behavioral disturbance: Secondary | ICD-10-CM | POA: Diagnosis not present

## 2018-05-18 DIAGNOSIS — F0390 Unspecified dementia without behavioral disturbance: Secondary | ICD-10-CM | POA: Diagnosis not present

## 2018-05-19 DIAGNOSIS — F0390 Unspecified dementia without behavioral disturbance: Secondary | ICD-10-CM | POA: Diagnosis not present

## 2018-05-20 DIAGNOSIS — F0390 Unspecified dementia without behavioral disturbance: Secondary | ICD-10-CM | POA: Diagnosis not present

## 2018-05-21 DIAGNOSIS — F0390 Unspecified dementia without behavioral disturbance: Secondary | ICD-10-CM | POA: Diagnosis not present

## 2018-05-22 DIAGNOSIS — R2689 Other abnormalities of gait and mobility: Secondary | ICD-10-CM | POA: Diagnosis not present

## 2018-05-22 DIAGNOSIS — F0390 Unspecified dementia without behavioral disturbance: Secondary | ICD-10-CM | POA: Diagnosis not present

## 2018-05-23 ENCOUNTER — Telehealth: Payer: Self-pay

## 2018-05-23 DIAGNOSIS — F0390 Unspecified dementia without behavioral disturbance: Secondary | ICD-10-CM | POA: Diagnosis not present

## 2018-05-24 DIAGNOSIS — F0390 Unspecified dementia without behavioral disturbance: Secondary | ICD-10-CM | POA: Diagnosis not present

## 2018-05-25 DIAGNOSIS — F0390 Unspecified dementia without behavioral disturbance: Secondary | ICD-10-CM | POA: Diagnosis not present

## 2018-05-26 DIAGNOSIS — I739 Peripheral vascular disease, unspecified: Secondary | ICD-10-CM | POA: Diagnosis not present

## 2018-05-26 DIAGNOSIS — B351 Tinea unguium: Secondary | ICD-10-CM | POA: Diagnosis not present

## 2018-05-26 DIAGNOSIS — L603 Nail dystrophy: Secondary | ICD-10-CM | POA: Diagnosis not present

## 2018-05-26 DIAGNOSIS — F0390 Unspecified dementia without behavioral disturbance: Secondary | ICD-10-CM | POA: Diagnosis not present

## 2018-05-26 DIAGNOSIS — Q845 Enlarged and hypertrophic nails: Secondary | ICD-10-CM | POA: Diagnosis not present

## 2018-05-27 DIAGNOSIS — F0390 Unspecified dementia without behavioral disturbance: Secondary | ICD-10-CM | POA: Diagnosis not present

## 2018-05-27 DIAGNOSIS — F322 Major depressive disorder, single episode, severe without psychotic features: Secondary | ICD-10-CM | POA: Diagnosis not present

## 2018-05-27 DIAGNOSIS — F411 Generalized anxiety disorder: Secondary | ICD-10-CM | POA: Diagnosis not present

## 2018-05-28 DIAGNOSIS — F0390 Unspecified dementia without behavioral disturbance: Secondary | ICD-10-CM | POA: Diagnosis not present

## 2018-05-29 DIAGNOSIS — F0390 Unspecified dementia without behavioral disturbance: Secondary | ICD-10-CM | POA: Diagnosis not present

## 2018-05-30 DIAGNOSIS — F0390 Unspecified dementia without behavioral disturbance: Secondary | ICD-10-CM | POA: Diagnosis not present

## 2018-05-30 DIAGNOSIS — F0150 Vascular dementia without behavioral disturbance: Secondary | ICD-10-CM | POA: Diagnosis not present

## 2018-05-30 DIAGNOSIS — F064 Anxiety disorder due to known physiological condition: Secondary | ICD-10-CM | POA: Diagnosis not present

## 2018-05-30 DIAGNOSIS — F331 Major depressive disorder, recurrent, moderate: Secondary | ICD-10-CM | POA: Diagnosis not present

## 2018-05-31 DIAGNOSIS — F0390 Unspecified dementia without behavioral disturbance: Secondary | ICD-10-CM | POA: Diagnosis not present

## 2018-06-01 DIAGNOSIS — F0390 Unspecified dementia without behavioral disturbance: Secondary | ICD-10-CM | POA: Diagnosis not present

## 2018-06-02 DIAGNOSIS — F0390 Unspecified dementia without behavioral disturbance: Secondary | ICD-10-CM | POA: Diagnosis not present

## 2018-06-03 DIAGNOSIS — F0390 Unspecified dementia without behavioral disturbance: Secondary | ICD-10-CM | POA: Diagnosis not present

## 2018-06-04 DIAGNOSIS — F0390 Unspecified dementia without behavioral disturbance: Secondary | ICD-10-CM | POA: Diagnosis not present

## 2018-06-05 DIAGNOSIS — F0390 Unspecified dementia without behavioral disturbance: Secondary | ICD-10-CM | POA: Diagnosis not present

## 2018-06-06 DIAGNOSIS — M545 Low back pain: Secondary | ICD-10-CM | POA: Diagnosis not present

## 2018-06-06 DIAGNOSIS — E785 Hyperlipidemia, unspecified: Secondary | ICD-10-CM | POA: Diagnosis not present

## 2018-06-06 DIAGNOSIS — F0390 Unspecified dementia without behavioral disturbance: Secondary | ICD-10-CM | POA: Diagnosis not present

## 2018-06-06 DIAGNOSIS — R634 Abnormal weight loss: Secondary | ICD-10-CM | POA: Diagnosis not present

## 2018-06-06 DIAGNOSIS — R2681 Unsteadiness on feet: Secondary | ICD-10-CM | POA: Diagnosis not present

## 2018-06-06 DIAGNOSIS — R296 Repeated falls: Secondary | ICD-10-CM | POA: Diagnosis not present

## 2018-06-06 DIAGNOSIS — G459 Transient cerebral ischemic attack, unspecified: Secondary | ICD-10-CM | POA: Diagnosis not present

## 2018-06-06 DIAGNOSIS — R159 Full incontinence of feces: Secondary | ICD-10-CM | POA: Diagnosis not present

## 2018-06-06 DIAGNOSIS — R32 Unspecified urinary incontinence: Secondary | ICD-10-CM | POA: Diagnosis not present

## 2018-06-07 DIAGNOSIS — F0390 Unspecified dementia without behavioral disturbance: Secondary | ICD-10-CM | POA: Diagnosis not present

## 2018-06-08 DIAGNOSIS — F0390 Unspecified dementia without behavioral disturbance: Secondary | ICD-10-CM | POA: Diagnosis not present

## 2018-06-09 DIAGNOSIS — F0390 Unspecified dementia without behavioral disturbance: Secondary | ICD-10-CM | POA: Diagnosis not present

## 2018-06-09 DIAGNOSIS — F419 Anxiety disorder, unspecified: Secondary | ICD-10-CM | POA: Diagnosis not present

## 2018-06-10 DIAGNOSIS — F322 Major depressive disorder, single episode, severe without psychotic features: Secondary | ICD-10-CM | POA: Diagnosis not present

## 2018-06-10 DIAGNOSIS — F0390 Unspecified dementia without behavioral disturbance: Secondary | ICD-10-CM | POA: Diagnosis not present

## 2018-06-10 DIAGNOSIS — F411 Generalized anxiety disorder: Secondary | ICD-10-CM | POA: Diagnosis not present

## 2018-06-11 DIAGNOSIS — F0390 Unspecified dementia without behavioral disturbance: Secondary | ICD-10-CM | POA: Diagnosis not present

## 2018-06-12 DIAGNOSIS — F0390 Unspecified dementia without behavioral disturbance: Secondary | ICD-10-CM | POA: Diagnosis not present

## 2018-06-13 DIAGNOSIS — F0390 Unspecified dementia without behavioral disturbance: Secondary | ICD-10-CM | POA: Diagnosis not present

## 2018-06-14 DIAGNOSIS — F0390 Unspecified dementia without behavioral disturbance: Secondary | ICD-10-CM | POA: Diagnosis not present

## 2018-06-15 DIAGNOSIS — F0390 Unspecified dementia without behavioral disturbance: Secondary | ICD-10-CM | POA: Diagnosis not present

## 2018-06-16 DIAGNOSIS — F0390 Unspecified dementia without behavioral disturbance: Secondary | ICD-10-CM | POA: Diagnosis not present

## 2018-06-17 DIAGNOSIS — F0390 Unspecified dementia without behavioral disturbance: Secondary | ICD-10-CM | POA: Diagnosis not present

## 2018-06-18 DIAGNOSIS — F0390 Unspecified dementia without behavioral disturbance: Secondary | ICD-10-CM | POA: Diagnosis not present

## 2018-06-19 DIAGNOSIS — F0390 Unspecified dementia without behavioral disturbance: Secondary | ICD-10-CM | POA: Diagnosis not present

## 2018-06-20 DIAGNOSIS — F0390 Unspecified dementia without behavioral disturbance: Secondary | ICD-10-CM | POA: Diagnosis not present

## 2018-06-21 DIAGNOSIS — F0390 Unspecified dementia without behavioral disturbance: Secondary | ICD-10-CM | POA: Diagnosis not present

## 2018-06-21 DIAGNOSIS — R2689 Other abnormalities of gait and mobility: Secondary | ICD-10-CM | POA: Diagnosis not present

## 2018-06-22 DIAGNOSIS — F0390 Unspecified dementia without behavioral disturbance: Secondary | ICD-10-CM | POA: Diagnosis not present

## 2018-06-23 DIAGNOSIS — F0390 Unspecified dementia without behavioral disturbance: Secondary | ICD-10-CM | POA: Diagnosis not present

## 2018-06-24 DIAGNOSIS — F322 Major depressive disorder, single episode, severe without psychotic features: Secondary | ICD-10-CM | POA: Diagnosis not present

## 2018-06-24 DIAGNOSIS — F0390 Unspecified dementia without behavioral disturbance: Secondary | ICD-10-CM | POA: Diagnosis not present

## 2018-06-24 DIAGNOSIS — F411 Generalized anxiety disorder: Secondary | ICD-10-CM | POA: Diagnosis not present

## 2018-06-25 DIAGNOSIS — F0390 Unspecified dementia without behavioral disturbance: Secondary | ICD-10-CM | POA: Diagnosis not present

## 2018-06-26 DIAGNOSIS — F0390 Unspecified dementia without behavioral disturbance: Secondary | ICD-10-CM | POA: Diagnosis not present

## 2018-06-27 DIAGNOSIS — F419 Anxiety disorder, unspecified: Secondary | ICD-10-CM | POA: Diagnosis not present

## 2018-06-27 DIAGNOSIS — F0150 Vascular dementia without behavioral disturbance: Secondary | ICD-10-CM | POA: Diagnosis not present

## 2018-06-27 DIAGNOSIS — F0390 Unspecified dementia without behavioral disturbance: Secondary | ICD-10-CM | POA: Diagnosis not present

## 2018-06-27 DIAGNOSIS — F064 Anxiety disorder due to known physiological condition: Secondary | ICD-10-CM | POA: Diagnosis not present

## 2018-06-27 DIAGNOSIS — F331 Major depressive disorder, recurrent, moderate: Secondary | ICD-10-CM | POA: Diagnosis not present

## 2018-06-28 DIAGNOSIS — F0390 Unspecified dementia without behavioral disturbance: Secondary | ICD-10-CM | POA: Diagnosis not present

## 2018-06-29 DIAGNOSIS — F0390 Unspecified dementia without behavioral disturbance: Secondary | ICD-10-CM | POA: Diagnosis not present

## 2018-06-30 DIAGNOSIS — F0390 Unspecified dementia without behavioral disturbance: Secondary | ICD-10-CM | POA: Diagnosis not present

## 2018-07-01 DIAGNOSIS — F0390 Unspecified dementia without behavioral disturbance: Secondary | ICD-10-CM | POA: Diagnosis not present

## 2018-07-02 DIAGNOSIS — F0390 Unspecified dementia without behavioral disturbance: Secondary | ICD-10-CM | POA: Diagnosis not present

## 2018-07-03 DIAGNOSIS — F0390 Unspecified dementia without behavioral disturbance: Secondary | ICD-10-CM | POA: Diagnosis not present

## 2018-07-04 DIAGNOSIS — F0390 Unspecified dementia without behavioral disturbance: Secondary | ICD-10-CM | POA: Diagnosis not present

## 2018-07-05 DIAGNOSIS — F0390 Unspecified dementia without behavioral disturbance: Secondary | ICD-10-CM | POA: Diagnosis not present

## 2018-07-06 DIAGNOSIS — F0390 Unspecified dementia without behavioral disturbance: Secondary | ICD-10-CM | POA: Diagnosis not present

## 2018-07-07 DIAGNOSIS — F0390 Unspecified dementia without behavioral disturbance: Secondary | ICD-10-CM | POA: Diagnosis not present

## 2018-07-08 DIAGNOSIS — F322 Major depressive disorder, single episode, severe without psychotic features: Secondary | ICD-10-CM | POA: Diagnosis not present

## 2018-07-08 DIAGNOSIS — F0390 Unspecified dementia without behavioral disturbance: Secondary | ICD-10-CM | POA: Diagnosis not present

## 2018-07-08 DIAGNOSIS — F411 Generalized anxiety disorder: Secondary | ICD-10-CM | POA: Diagnosis not present

## 2018-07-09 DIAGNOSIS — F0390 Unspecified dementia without behavioral disturbance: Secondary | ICD-10-CM | POA: Diagnosis not present

## 2018-07-10 DIAGNOSIS — R32 Unspecified urinary incontinence: Secondary | ICD-10-CM | POA: Diagnosis not present

## 2018-07-10 DIAGNOSIS — F0390 Unspecified dementia without behavioral disturbance: Secondary | ICD-10-CM | POA: Diagnosis not present

## 2018-07-10 DIAGNOSIS — R159 Full incontinence of feces: Secondary | ICD-10-CM | POA: Diagnosis not present

## 2018-07-11 DIAGNOSIS — F0390 Unspecified dementia without behavioral disturbance: Secondary | ICD-10-CM | POA: Diagnosis not present

## 2018-07-11 DIAGNOSIS — F419 Anxiety disorder, unspecified: Secondary | ICD-10-CM | POA: Diagnosis not present

## 2018-07-12 DIAGNOSIS — F0390 Unspecified dementia without behavioral disturbance: Secondary | ICD-10-CM | POA: Diagnosis not present

## 2018-07-13 DIAGNOSIS — F0390 Unspecified dementia without behavioral disturbance: Secondary | ICD-10-CM | POA: Diagnosis not present

## 2018-07-14 DIAGNOSIS — F0390 Unspecified dementia without behavioral disturbance: Secondary | ICD-10-CM | POA: Diagnosis not present

## 2018-07-15 DIAGNOSIS — F0390 Unspecified dementia without behavioral disturbance: Secondary | ICD-10-CM | POA: Diagnosis not present

## 2018-07-16 DIAGNOSIS — F0390 Unspecified dementia without behavioral disturbance: Secondary | ICD-10-CM | POA: Diagnosis not present

## 2018-07-17 DIAGNOSIS — F0390 Unspecified dementia without behavioral disturbance: Secondary | ICD-10-CM | POA: Diagnosis not present

## 2018-07-18 DIAGNOSIS — R2681 Unsteadiness on feet: Secondary | ICD-10-CM | POA: Diagnosis not present

## 2018-07-18 DIAGNOSIS — G459 Transient cerebral ischemic attack, unspecified: Secondary | ICD-10-CM | POA: Diagnosis not present

## 2018-07-18 DIAGNOSIS — E785 Hyperlipidemia, unspecified: Secondary | ICD-10-CM | POA: Diagnosis not present

## 2018-07-18 DIAGNOSIS — R296 Repeated falls: Secondary | ICD-10-CM | POA: Diagnosis not present

## 2018-07-18 DIAGNOSIS — M545 Low back pain: Secondary | ICD-10-CM | POA: Diagnosis not present

## 2018-07-18 DIAGNOSIS — R634 Abnormal weight loss: Secondary | ICD-10-CM | POA: Diagnosis not present

## 2018-07-18 DIAGNOSIS — E782 Mixed hyperlipidemia: Secondary | ICD-10-CM | POA: Diagnosis not present

## 2018-07-18 DIAGNOSIS — E039 Hypothyroidism, unspecified: Secondary | ICD-10-CM | POA: Diagnosis not present

## 2018-07-18 DIAGNOSIS — I10 Essential (primary) hypertension: Secondary | ICD-10-CM | POA: Diagnosis not present

## 2018-07-18 DIAGNOSIS — F0390 Unspecified dementia without behavioral disturbance: Secondary | ICD-10-CM | POA: Diagnosis not present

## 2018-07-19 DIAGNOSIS — F0390 Unspecified dementia without behavioral disturbance: Secondary | ICD-10-CM | POA: Diagnosis not present

## 2018-07-20 DIAGNOSIS — F0390 Unspecified dementia without behavioral disturbance: Secondary | ICD-10-CM | POA: Diagnosis not present

## 2018-07-21 DIAGNOSIS — F0390 Unspecified dementia without behavioral disturbance: Secondary | ICD-10-CM | POA: Diagnosis not present

## 2018-07-22 DIAGNOSIS — R2689 Other abnormalities of gait and mobility: Secondary | ICD-10-CM | POA: Diagnosis not present

## 2018-07-22 DIAGNOSIS — F0390 Unspecified dementia without behavioral disturbance: Secondary | ICD-10-CM | POA: Diagnosis not present

## 2018-07-22 DIAGNOSIS — F322 Major depressive disorder, single episode, severe without psychotic features: Secondary | ICD-10-CM | POA: Diagnosis not present

## 2018-07-22 DIAGNOSIS — F411 Generalized anxiety disorder: Secondary | ICD-10-CM | POA: Diagnosis not present

## 2018-07-23 DIAGNOSIS — E039 Hypothyroidism, unspecified: Secondary | ICD-10-CM | POA: Diagnosis not present

## 2018-07-23 DIAGNOSIS — F0390 Unspecified dementia without behavioral disturbance: Secondary | ICD-10-CM | POA: Diagnosis not present

## 2018-07-23 DIAGNOSIS — I10 Essential (primary) hypertension: Secondary | ICD-10-CM | POA: Diagnosis not present

## 2018-07-23 DIAGNOSIS — E782 Mixed hyperlipidemia: Secondary | ICD-10-CM | POA: Diagnosis not present

## 2018-07-24 DIAGNOSIS — F0390 Unspecified dementia without behavioral disturbance: Secondary | ICD-10-CM | POA: Diagnosis not present

## 2018-07-25 DIAGNOSIS — F331 Major depressive disorder, recurrent, moderate: Secondary | ICD-10-CM | POA: Diagnosis not present

## 2018-07-25 DIAGNOSIS — F419 Anxiety disorder, unspecified: Secondary | ICD-10-CM | POA: Diagnosis not present

## 2018-07-25 DIAGNOSIS — F064 Anxiety disorder due to known physiological condition: Secondary | ICD-10-CM | POA: Diagnosis not present

## 2018-07-25 DIAGNOSIS — F0150 Vascular dementia without behavioral disturbance: Secondary | ICD-10-CM | POA: Diagnosis not present

## 2018-07-25 DIAGNOSIS — F0390 Unspecified dementia without behavioral disturbance: Secondary | ICD-10-CM | POA: Diagnosis not present

## 2018-07-26 DIAGNOSIS — F0390 Unspecified dementia without behavioral disturbance: Secondary | ICD-10-CM | POA: Diagnosis not present

## 2018-07-27 DIAGNOSIS — F0390 Unspecified dementia without behavioral disturbance: Secondary | ICD-10-CM | POA: Diagnosis not present

## 2018-07-28 DIAGNOSIS — F0390 Unspecified dementia without behavioral disturbance: Secondary | ICD-10-CM | POA: Diagnosis not present

## 2018-07-29 DIAGNOSIS — F0390 Unspecified dementia without behavioral disturbance: Secondary | ICD-10-CM | POA: Diagnosis not present

## 2018-07-30 DIAGNOSIS — F0390 Unspecified dementia without behavioral disturbance: Secondary | ICD-10-CM | POA: Diagnosis not present

## 2018-07-31 DIAGNOSIS — F0390 Unspecified dementia without behavioral disturbance: Secondary | ICD-10-CM | POA: Diagnosis not present

## 2018-08-01 DIAGNOSIS — F0390 Unspecified dementia without behavioral disturbance: Secondary | ICD-10-CM | POA: Diagnosis not present

## 2018-08-02 DIAGNOSIS — F0390 Unspecified dementia without behavioral disturbance: Secondary | ICD-10-CM | POA: Diagnosis not present

## 2018-08-03 DIAGNOSIS — F0390 Unspecified dementia without behavioral disturbance: Secondary | ICD-10-CM | POA: Diagnosis not present

## 2018-08-04 DIAGNOSIS — F0390 Unspecified dementia without behavioral disturbance: Secondary | ICD-10-CM | POA: Diagnosis not present

## 2018-08-05 DIAGNOSIS — F0390 Unspecified dementia without behavioral disturbance: Secondary | ICD-10-CM | POA: Diagnosis not present

## 2018-08-05 DIAGNOSIS — F411 Generalized anxiety disorder: Secondary | ICD-10-CM | POA: Diagnosis not present

## 2018-08-05 DIAGNOSIS — F322 Major depressive disorder, single episode, severe without psychotic features: Secondary | ICD-10-CM | POA: Diagnosis not present

## 2018-08-06 DIAGNOSIS — F0390 Unspecified dementia without behavioral disturbance: Secondary | ICD-10-CM | POA: Diagnosis not present

## 2018-08-07 DIAGNOSIS — F0390 Unspecified dementia without behavioral disturbance: Secondary | ICD-10-CM | POA: Diagnosis not present

## 2018-08-08 DIAGNOSIS — R159 Full incontinence of feces: Secondary | ICD-10-CM | POA: Diagnosis not present

## 2018-08-08 DIAGNOSIS — R32 Unspecified urinary incontinence: Secondary | ICD-10-CM | POA: Diagnosis not present

## 2018-08-08 DIAGNOSIS — F0390 Unspecified dementia without behavioral disturbance: Secondary | ICD-10-CM | POA: Diagnosis not present

## 2018-08-09 DIAGNOSIS — F0390 Unspecified dementia without behavioral disturbance: Secondary | ICD-10-CM | POA: Diagnosis not present

## 2018-08-10 DIAGNOSIS — F0390 Unspecified dementia without behavioral disturbance: Secondary | ICD-10-CM | POA: Diagnosis not present

## 2018-08-11 DIAGNOSIS — F0390 Unspecified dementia without behavioral disturbance: Secondary | ICD-10-CM | POA: Diagnosis not present

## 2018-08-12 DIAGNOSIS — F0390 Unspecified dementia without behavioral disturbance: Secondary | ICD-10-CM | POA: Diagnosis not present

## 2018-08-13 DIAGNOSIS — F0390 Unspecified dementia without behavioral disturbance: Secondary | ICD-10-CM | POA: Diagnosis not present

## 2018-08-14 DIAGNOSIS — F331 Major depressive disorder, recurrent, moderate: Secondary | ICD-10-CM | POA: Diagnosis not present

## 2018-08-14 DIAGNOSIS — F064 Anxiety disorder due to known physiological condition: Secondary | ICD-10-CM | POA: Diagnosis not present

## 2018-08-14 DIAGNOSIS — R296 Repeated falls: Secondary | ICD-10-CM | POA: Diagnosis not present

## 2018-08-14 DIAGNOSIS — R52 Pain, unspecified: Secondary | ICD-10-CM | POA: Diagnosis not present

## 2018-08-14 DIAGNOSIS — G459 Transient cerebral ischemic attack, unspecified: Secondary | ICD-10-CM | POA: Diagnosis not present

## 2018-08-14 DIAGNOSIS — F0150 Vascular dementia without behavioral disturbance: Secondary | ICD-10-CM | POA: Diagnosis not present

## 2018-08-14 DIAGNOSIS — F0390 Unspecified dementia without behavioral disturbance: Secondary | ICD-10-CM | POA: Diagnosis not present

## 2018-08-14 DIAGNOSIS — E785 Hyperlipidemia, unspecified: Secondary | ICD-10-CM | POA: Diagnosis not present

## 2018-08-14 DIAGNOSIS — R2681 Unsteadiness on feet: Secondary | ICD-10-CM | POA: Diagnosis not present

## 2018-08-15 DIAGNOSIS — F0390 Unspecified dementia without behavioral disturbance: Secondary | ICD-10-CM | POA: Diagnosis not present

## 2018-08-16 DIAGNOSIS — F0390 Unspecified dementia without behavioral disturbance: Secondary | ICD-10-CM | POA: Diagnosis not present

## 2018-08-17 DIAGNOSIS — F0390 Unspecified dementia without behavioral disturbance: Secondary | ICD-10-CM | POA: Diagnosis not present

## 2018-08-18 DIAGNOSIS — F0390 Unspecified dementia without behavioral disturbance: Secondary | ICD-10-CM | POA: Diagnosis not present

## 2018-08-19 DIAGNOSIS — F0390 Unspecified dementia without behavioral disturbance: Secondary | ICD-10-CM | POA: Diagnosis not present

## 2018-08-20 DIAGNOSIS — F0390 Unspecified dementia without behavioral disturbance: Secondary | ICD-10-CM | POA: Diagnosis not present

## 2018-08-21 DIAGNOSIS — L603 Nail dystrophy: Secondary | ICD-10-CM | POA: Diagnosis not present

## 2018-08-21 DIAGNOSIS — Q845 Enlarged and hypertrophic nails: Secondary | ICD-10-CM | POA: Diagnosis not present

## 2018-08-21 DIAGNOSIS — F0390 Unspecified dementia without behavioral disturbance: Secondary | ICD-10-CM | POA: Diagnosis not present

## 2018-08-21 DIAGNOSIS — I739 Peripheral vascular disease, unspecified: Secondary | ICD-10-CM | POA: Diagnosis not present

## 2018-08-21 DIAGNOSIS — B351 Tinea unguium: Secondary | ICD-10-CM | POA: Diagnosis not present

## 2018-08-22 DIAGNOSIS — G459 Transient cerebral ischemic attack, unspecified: Secondary | ICD-10-CM | POA: Diagnosis not present

## 2018-08-22 DIAGNOSIS — F0390 Unspecified dementia without behavioral disturbance: Secondary | ICD-10-CM | POA: Diagnosis not present

## 2018-08-22 DIAGNOSIS — E782 Mixed hyperlipidemia: Secondary | ICD-10-CM | POA: Diagnosis not present

## 2018-08-22 DIAGNOSIS — E039 Hypothyroidism, unspecified: Secondary | ICD-10-CM | POA: Diagnosis not present

## 2018-08-22 DIAGNOSIS — E785 Hyperlipidemia, unspecified: Secondary | ICD-10-CM | POA: Diagnosis not present

## 2018-08-22 DIAGNOSIS — M545 Low back pain: Secondary | ICD-10-CM | POA: Diagnosis not present

## 2018-08-22 DIAGNOSIS — I10 Essential (primary) hypertension: Secondary | ICD-10-CM | POA: Diagnosis not present

## 2018-08-22 DIAGNOSIS — R2681 Unsteadiness on feet: Secondary | ICD-10-CM | POA: Diagnosis not present

## 2018-08-22 DIAGNOSIS — R296 Repeated falls: Secondary | ICD-10-CM | POA: Diagnosis not present

## 2018-08-22 DIAGNOSIS — R634 Abnormal weight loss: Secondary | ICD-10-CM | POA: Diagnosis not present

## 2018-08-22 DIAGNOSIS — R2689 Other abnormalities of gait and mobility: Secondary | ICD-10-CM | POA: Diagnosis not present

## 2018-08-23 DIAGNOSIS — F0390 Unspecified dementia without behavioral disturbance: Secondary | ICD-10-CM | POA: Diagnosis not present

## 2018-08-24 DIAGNOSIS — F0390 Unspecified dementia without behavioral disturbance: Secondary | ICD-10-CM | POA: Diagnosis not present

## 2018-08-25 DIAGNOSIS — F0390 Unspecified dementia without behavioral disturbance: Secondary | ICD-10-CM | POA: Diagnosis not present

## 2018-08-26 DIAGNOSIS — F0390 Unspecified dementia without behavioral disturbance: Secondary | ICD-10-CM | POA: Diagnosis not present

## 2018-08-27 DIAGNOSIS — F0390 Unspecified dementia without behavioral disturbance: Secondary | ICD-10-CM | POA: Diagnosis not present

## 2018-08-28 DIAGNOSIS — F0390 Unspecified dementia without behavioral disturbance: Secondary | ICD-10-CM | POA: Diagnosis not present

## 2018-08-29 DIAGNOSIS — F0390 Unspecified dementia without behavioral disturbance: Secondary | ICD-10-CM | POA: Diagnosis not present

## 2018-08-30 DIAGNOSIS — F0390 Unspecified dementia without behavioral disturbance: Secondary | ICD-10-CM | POA: Diagnosis not present

## 2018-08-31 DIAGNOSIS — F0390 Unspecified dementia without behavioral disturbance: Secondary | ICD-10-CM | POA: Diagnosis not present

## 2018-09-01 DIAGNOSIS — F0390 Unspecified dementia without behavioral disturbance: Secondary | ICD-10-CM | POA: Diagnosis not present

## 2018-09-02 DIAGNOSIS — F0390 Unspecified dementia without behavioral disturbance: Secondary | ICD-10-CM | POA: Diagnosis not present

## 2018-09-03 DIAGNOSIS — F0390 Unspecified dementia without behavioral disturbance: Secondary | ICD-10-CM | POA: Diagnosis not present

## 2018-09-04 DIAGNOSIS — F0390 Unspecified dementia without behavioral disturbance: Secondary | ICD-10-CM | POA: Diagnosis not present

## 2018-09-05 DIAGNOSIS — R159 Full incontinence of feces: Secondary | ICD-10-CM | POA: Diagnosis not present

## 2018-09-05 DIAGNOSIS — F0390 Unspecified dementia without behavioral disturbance: Secondary | ICD-10-CM | POA: Diagnosis not present

## 2018-09-05 DIAGNOSIS — R32 Unspecified urinary incontinence: Secondary | ICD-10-CM | POA: Diagnosis not present

## 2018-09-06 DIAGNOSIS — F0390 Unspecified dementia without behavioral disturbance: Secondary | ICD-10-CM | POA: Diagnosis not present

## 2018-09-07 DIAGNOSIS — F0390 Unspecified dementia without behavioral disturbance: Secondary | ICD-10-CM | POA: Diagnosis not present

## 2018-09-08 DIAGNOSIS — F0390 Unspecified dementia without behavioral disturbance: Secondary | ICD-10-CM | POA: Diagnosis not present

## 2018-09-09 DIAGNOSIS — F0390 Unspecified dementia without behavioral disturbance: Secondary | ICD-10-CM | POA: Diagnosis not present

## 2018-09-10 DIAGNOSIS — F0390 Unspecified dementia without behavioral disturbance: Secondary | ICD-10-CM | POA: Diagnosis not present

## 2018-09-11 DIAGNOSIS — F0390 Unspecified dementia without behavioral disturbance: Secondary | ICD-10-CM | POA: Diagnosis not present

## 2018-09-12 DIAGNOSIS — F0390 Unspecified dementia without behavioral disturbance: Secondary | ICD-10-CM | POA: Diagnosis not present

## 2018-09-13 DIAGNOSIS — F0390 Unspecified dementia without behavioral disturbance: Secondary | ICD-10-CM | POA: Diagnosis not present

## 2018-09-14 DIAGNOSIS — F0390 Unspecified dementia without behavioral disturbance: Secondary | ICD-10-CM | POA: Diagnosis not present

## 2018-09-15 DIAGNOSIS — F0390 Unspecified dementia without behavioral disturbance: Secondary | ICD-10-CM | POA: Diagnosis not present

## 2018-09-16 ENCOUNTER — Telehealth: Payer: Self-pay | Admitting: Family Medicine

## 2018-09-16 DIAGNOSIS — F0390 Unspecified dementia without behavioral disturbance: Secondary | ICD-10-CM | POA: Diagnosis not present

## 2018-09-16 NOTE — Telephone Encounter (Signed)
Pt's son stated she is currently residing at [facility] and has regular check ups with the physician over there.

## 2018-09-17 DIAGNOSIS — F0390 Unspecified dementia without behavioral disturbance: Secondary | ICD-10-CM | POA: Diagnosis not present

## 2018-09-18 DIAGNOSIS — F419 Anxiety disorder, unspecified: Secondary | ICD-10-CM | POA: Diagnosis not present

## 2018-09-18 DIAGNOSIS — F0390 Unspecified dementia without behavioral disturbance: Secondary | ICD-10-CM | POA: Diagnosis not present

## 2018-09-18 DIAGNOSIS — F064 Anxiety disorder due to known physiological condition: Secondary | ICD-10-CM | POA: Diagnosis not present

## 2018-09-18 DIAGNOSIS — F0150 Vascular dementia without behavioral disturbance: Secondary | ICD-10-CM | POA: Diagnosis not present

## 2018-09-18 DIAGNOSIS — F331 Major depressive disorder, recurrent, moderate: Secondary | ICD-10-CM | POA: Diagnosis not present

## 2018-09-19 DIAGNOSIS — F0390 Unspecified dementia without behavioral disturbance: Secondary | ICD-10-CM | POA: Diagnosis not present

## 2018-09-20 DIAGNOSIS — F0390 Unspecified dementia without behavioral disturbance: Secondary | ICD-10-CM | POA: Diagnosis not present

## 2018-09-21 DIAGNOSIS — F0390 Unspecified dementia without behavioral disturbance: Secondary | ICD-10-CM | POA: Diagnosis not present

## 2018-09-21 DIAGNOSIS — R2689 Other abnormalities of gait and mobility: Secondary | ICD-10-CM | POA: Diagnosis not present

## 2018-09-22 DIAGNOSIS — F0390 Unspecified dementia without behavioral disturbance: Secondary | ICD-10-CM | POA: Diagnosis not present

## 2018-09-23 DIAGNOSIS — F0390 Unspecified dementia without behavioral disturbance: Secondary | ICD-10-CM | POA: Diagnosis not present

## 2018-09-24 DIAGNOSIS — F0390 Unspecified dementia without behavioral disturbance: Secondary | ICD-10-CM | POA: Diagnosis not present

## 2018-09-25 DIAGNOSIS — F0390 Unspecified dementia without behavioral disturbance: Secondary | ICD-10-CM | POA: Diagnosis not present

## 2018-09-26 DIAGNOSIS — I10 Essential (primary) hypertension: Secondary | ICD-10-CM | POA: Diagnosis not present

## 2018-09-26 DIAGNOSIS — R2681 Unsteadiness on feet: Secondary | ICD-10-CM | POA: Diagnosis not present

## 2018-09-26 DIAGNOSIS — R296 Repeated falls: Secondary | ICD-10-CM | POA: Diagnosis not present

## 2018-09-26 DIAGNOSIS — M545 Low back pain: Secondary | ICD-10-CM | POA: Diagnosis not present

## 2018-09-26 DIAGNOSIS — E785 Hyperlipidemia, unspecified: Secondary | ICD-10-CM | POA: Diagnosis not present

## 2018-09-26 DIAGNOSIS — R634 Abnormal weight loss: Secondary | ICD-10-CM | POA: Diagnosis not present

## 2018-09-26 DIAGNOSIS — E039 Hypothyroidism, unspecified: Secondary | ICD-10-CM | POA: Diagnosis not present

## 2018-09-26 DIAGNOSIS — G459 Transient cerebral ischemic attack, unspecified: Secondary | ICD-10-CM | POA: Diagnosis not present

## 2018-09-26 DIAGNOSIS — F0390 Unspecified dementia without behavioral disturbance: Secondary | ICD-10-CM | POA: Diagnosis not present

## 2018-09-26 DIAGNOSIS — E782 Mixed hyperlipidemia: Secondary | ICD-10-CM | POA: Diagnosis not present

## 2018-09-27 DIAGNOSIS — F0390 Unspecified dementia without behavioral disturbance: Secondary | ICD-10-CM | POA: Diagnosis not present

## 2018-09-28 DIAGNOSIS — F0390 Unspecified dementia without behavioral disturbance: Secondary | ICD-10-CM | POA: Diagnosis not present

## 2018-09-29 DIAGNOSIS — F0390 Unspecified dementia without behavioral disturbance: Secondary | ICD-10-CM | POA: Diagnosis not present

## 2018-09-30 DIAGNOSIS — R296 Repeated falls: Secondary | ICD-10-CM | POA: Diagnosis not present

## 2018-09-30 DIAGNOSIS — G459 Transient cerebral ischemic attack, unspecified: Secondary | ICD-10-CM | POA: Diagnosis not present

## 2018-09-30 DIAGNOSIS — I10 Essential (primary) hypertension: Secondary | ICD-10-CM | POA: Diagnosis not present

## 2018-09-30 DIAGNOSIS — R634 Abnormal weight loss: Secondary | ICD-10-CM | POA: Diagnosis not present

## 2018-09-30 DIAGNOSIS — R2681 Unsteadiness on feet: Secondary | ICD-10-CM | POA: Diagnosis not present

## 2018-09-30 DIAGNOSIS — E785 Hyperlipidemia, unspecified: Secondary | ICD-10-CM | POA: Diagnosis not present

## 2018-09-30 DIAGNOSIS — E782 Mixed hyperlipidemia: Secondary | ICD-10-CM | POA: Diagnosis not present

## 2018-09-30 DIAGNOSIS — M545 Low back pain: Secondary | ICD-10-CM | POA: Diagnosis not present

## 2018-09-30 DIAGNOSIS — F0390 Unspecified dementia without behavioral disturbance: Secondary | ICD-10-CM | POA: Diagnosis not present

## 2018-09-30 DIAGNOSIS — E039 Hypothyroidism, unspecified: Secondary | ICD-10-CM | POA: Diagnosis not present

## 2018-10-01 DIAGNOSIS — F0390 Unspecified dementia without behavioral disturbance: Secondary | ICD-10-CM | POA: Diagnosis not present

## 2018-10-02 DIAGNOSIS — F0390 Unspecified dementia without behavioral disturbance: Secondary | ICD-10-CM | POA: Diagnosis not present

## 2018-10-03 DIAGNOSIS — F419 Anxiety disorder, unspecified: Secondary | ICD-10-CM | POA: Diagnosis not present

## 2018-10-03 DIAGNOSIS — F0390 Unspecified dementia without behavioral disturbance: Secondary | ICD-10-CM | POA: Diagnosis not present

## 2018-10-04 DIAGNOSIS — F0390 Unspecified dementia without behavioral disturbance: Secondary | ICD-10-CM | POA: Diagnosis not present

## 2018-10-05 DIAGNOSIS — F0390 Unspecified dementia without behavioral disturbance: Secondary | ICD-10-CM | POA: Diagnosis not present

## 2018-10-06 DIAGNOSIS — F0390 Unspecified dementia without behavioral disturbance: Secondary | ICD-10-CM | POA: Diagnosis not present

## 2018-10-07 DIAGNOSIS — F0390 Unspecified dementia without behavioral disturbance: Secondary | ICD-10-CM | POA: Diagnosis not present

## 2018-10-08 DIAGNOSIS — R32 Unspecified urinary incontinence: Secondary | ICD-10-CM | POA: Diagnosis not present

## 2018-10-08 DIAGNOSIS — F0390 Unspecified dementia without behavioral disturbance: Secondary | ICD-10-CM | POA: Diagnosis not present

## 2018-10-08 DIAGNOSIS — R159 Full incontinence of feces: Secondary | ICD-10-CM | POA: Diagnosis not present

## 2018-10-09 DIAGNOSIS — F0390 Unspecified dementia without behavioral disturbance: Secondary | ICD-10-CM | POA: Diagnosis not present

## 2018-10-10 DIAGNOSIS — F0390 Unspecified dementia without behavioral disturbance: Secondary | ICD-10-CM | POA: Diagnosis not present

## 2018-10-11 ENCOUNTER — Emergency Department (HOSPITAL_COMMUNITY)
Admission: EM | Admit: 2018-10-11 | Discharge: 2018-10-11 | Discharge status: Absent without leave | Payer: Medicare HMO | Source: Home / Self Care

## 2018-10-11 ENCOUNTER — Emergency Department (HOSPITAL_COMMUNITY): Payer: Medicare HMO

## 2018-10-11 ENCOUNTER — Emergency Department (HOSPITAL_COMMUNITY)
Admission: EM | Admit: 2018-10-11 | Discharge: 2018-10-11 | Disposition: A | Discharge status: Skilled Nursing Facility | Payer: Medicare HMO | Attending: Emergency Medicine | Admitting: Emergency Medicine

## 2018-10-11 ENCOUNTER — Encounter (HOSPITAL_COMMUNITY): Payer: Self-pay | Admitting: Emergency Medicine

## 2018-10-11 DIAGNOSIS — S199XXA Unspecified injury of neck, initial encounter: Secondary | ICD-10-CM | POA: Diagnosis not present

## 2018-10-11 DIAGNOSIS — Z79899 Other long term (current) drug therapy: Secondary | ICD-10-CM | POA: Diagnosis not present

## 2018-10-11 DIAGNOSIS — Z7401 Bed confinement status: Secondary | ICD-10-CM | POA: Diagnosis not present

## 2018-10-11 DIAGNOSIS — Z7982 Long term (current) use of aspirin: Secondary | ICD-10-CM | POA: Diagnosis not present

## 2018-10-11 DIAGNOSIS — M2550 Pain in unspecified joint: Secondary | ICD-10-CM | POA: Diagnosis not present

## 2018-10-11 DIAGNOSIS — Y92122 Bedroom in nursing home as the place of occurrence of the external cause: Secondary | ICD-10-CM | POA: Insufficient documentation

## 2018-10-11 DIAGNOSIS — F0390 Unspecified dementia without behavioral disturbance: Secondary | ICD-10-CM | POA: Insufficient documentation

## 2018-10-11 DIAGNOSIS — I10 Essential (primary) hypertension: Secondary | ICD-10-CM | POA: Insufficient documentation

## 2018-10-11 DIAGNOSIS — R0902 Hypoxemia: Secondary | ICD-10-CM | POA: Diagnosis not present

## 2018-10-11 DIAGNOSIS — Y92129 Unspecified place in nursing home as the place of occurrence of the external cause: Secondary | ICD-10-CM

## 2018-10-11 DIAGNOSIS — Y939 Activity, unspecified: Secondary | ICD-10-CM | POA: Diagnosis not present

## 2018-10-11 DIAGNOSIS — Y999 Unspecified external cause status: Secondary | ICD-10-CM | POA: Insufficient documentation

## 2018-10-11 DIAGNOSIS — W19XXXA Unspecified fall, initial encounter: Secondary | ICD-10-CM | POA: Insufficient documentation

## 2018-10-11 DIAGNOSIS — M542 Cervicalgia: Secondary | ICD-10-CM | POA: Insufficient documentation

## 2018-10-11 DIAGNOSIS — Z87891 Personal history of nicotine dependence: Secondary | ICD-10-CM | POA: Diagnosis not present

## 2018-10-11 DIAGNOSIS — R21 Rash and other nonspecific skin eruption: Secondary | ICD-10-CM | POA: Diagnosis not present

## 2018-10-11 HISTORY — DX: Unspecified dementia, unspecified severity, without behavioral disturbance, psychotic disturbance, mood disturbance, and anxiety: F03.90

## 2018-10-11 NOTE — ED Notes (Signed)
Pt condition stable denies pain is alert to person

## 2018-10-11 NOTE — ED Notes (Signed)
Attempt to call report to [facility]-no one answers the phone

## 2018-10-11 NOTE — ED Triage Notes (Signed)
Pt from [facility] [place] found on floor beside her bed by staff pt sent for medical clearance related to fall from bed

## 2018-10-11 NOTE — ED Provider Notes (Addendum)
[facility]-EMERGENCY DEPT Provider Note: Lowella Dell, MD, FACEP  CSN: [id number] MRN: [id number] ARRIVAL: 10/11/18 at 0336 ROOM: WA15/WA15   CHIEF COMPLAINT  Fall  Level 5 caveat: Dementia HISTORY OF PRESENT ILLNESS  10/11/18 5:42 AM Olivia Robertson is a 82 y.o. female with a history of dementia who was found lying on her floor next to her bed at her living facility.  It is presumed she fell from bed.  There is no obvious injury.  She answers "yes" when asked if her neck hurts but answers "no" to questions if anything else hurts.  She has a very limited ability to answer questions.   Past Medical History:  Diagnosis Date  . Anxiety   . Closed fracture of left distal radius   . Decreased appetite 05/07/2017  . Delirium   . Dementia (HCC)   . Distal radius fracture, left   . Frailty syndrome in geriatric patient 07/15/2017  . History of posttraumatic stress disorder (PTSD)   . History of prediabetes   . HTN (hypertension)   . Hyperlipidemia   . Laceration of skin of forehead   . Nasal fracture   . Stroke (HCC)   . Thoracic aortic atherosclerosis (HCC) 07/15/2017  . Traumatic subdural hemorrhage (HCC)   . UTI (urinary tract infection)     Past Surgical History:  Procedure Laterality Date  . BREAST BIOPSY Bilateral   . HYSTEROTOMY      Family History  Adopted: Yes  Problem Relation Age of Onset  . Alcohol abuse Mother     Social History   Tobacco Use  . Smoking status: Former Games developer  . Smokeless tobacco: Former Engineer, water Use Topics  . Alcohol use: No    Alcohol/week: 0.0 standard drinks  . Drug use: No    Prior to Admission medications   Medication Sig Start Date End Date Taking? Authorizing Provider  acetaminophen (TYLENOL) 500 MG tablet Take 500-1,000 mg by mouth every 6 (six) hours as needed. FOR MILD TO SEVERE PAIN    [provider]  aspirin EC 81 MG tablet Take 1 tablet (81 mg total) by mouth daily. 07/22/17   Casey Burkitt, MD   atorvastatin (LIPITOR) 40 MG tablet Take 1 tablet (40 mg total) by mouth daily. 07/10/17   Freddrick March, MD  bacitracin ointment Apply 1 application topically 2 (two) times daily. 01/28/18   Ward, Chase Picket, PA-C  cephALEXin (KEFLEX) 500 MG capsule Take 500 mg by mouth 2 (two) times daily.    [provider]  hydrOXYzine (ATARAX/VISTARIL) 25 MG tablet Take 25 mg by mouth 2 (two) times daily.    [provider]  LORazepam (ATIVAN) 0.5 MG tablet Take 0.25 mg by mouth every 12 (twelve) hours as needed for anxiety.    [provider]  neomycin-bacitracin-polymyxin (NEOSPORIN) [phone] ointment Apply 1 application topically daily. APPLY TO UPPER BACK ONCE DAILY AFTER WASHING WITH SOAP AND WATER    [provider]  NUTRITIONAL SUPPLEMENT LIQD Take 120 mLs by mouth 3 (three) times daily. [employer] protein shakes    [provider]  sertraline (ZOLOFT) 50 MG tablet Take 12.5 mg by mouth daily.     [provider]  sulfamethoxazole-trimethoprim (BACTRIM DS,SEPTRA DS) 800-160 MG tablet Take 1 tablet by mouth 2 (two) times daily. FOR 7 DAYS 01/25/18   [provider]    Allergies Penicillins and Penicillins   REVIEW OF SYSTEMS     PHYSICAL EXAMINATION  Initial Vital Signs Blood  pressure 133/70, pulse 67, temperature 98.3 F (36.8 C), temperature source Oral, resp. rate 15, SpO2 95 %.  Examination General: Well-developed, cachectic female in no acute distress; appearance consistent with age of record HENT: normocephalic; atraumatic Eyes: pupils equal, round and reactive to light; extraocular muscles intact Neck: supple; nontender Heart: regular rate and rhythm Lungs: clear to auscultation bilaterally Abdomen: soft; nondistended; nontender; bowel sounds present Extremities: No deformity; full range of motion; pulses normal Neurologic: Awake, alert; limited ability to answer questions; motor function intact in all extremities and  symmetric; no facial droop Skin: Warm and dry Psychiatric: Flat affect   RESULTS  Summary of this visit's results, reviewed by myself:   EKG Interpretation  Date/Time:    Ventricular Rate:    PR Interval:    QRS Duration:   QT Interval:    QTC Calculation:   R Axis:     Text Interpretation:        Laboratory Studies: No results found for this or any previous visit (from the past 24 hour(s)). Imaging Studies: Ct Cervical Spine Wo Contrast  Result Date: 10/11/2018 CLINICAL DATA:  82 year old female with C-spine trauma. EXAM: CT CERVICAL SPINE WITHOUT CONTRAST TECHNIQUE: Multidetector CT imaging of the cervical spine was performed without intravenous contrast. Multiplanar CT image reconstructions were also generated. COMPARISON:  C-spine CT dated 02/28/2018 FINDINGS: Alignment: No acute subluxation. Skull base and vertebrae: No acute fracture. Osteopenia and degenerative changes. Soft tissues and spinal canal: No prevertebral fluid or swelling. No visible canal hematoma. Disc levels: Multilevel degenerative changes with disc space narrowing and endplate irregularity. Multilevel facet hypertrophy and osteophyte with narrowing of the neural foramina. Upper chest: Biapical scarring. Other: Bilateral carotid bulb calcified plaques. IMPRESSION: 1. No acute/traumatic cervical spine pathology. 2. Multilevel degenerative changes. Electronically Signed   By: Elgie Collard M.D.   On: 10/11/2018 06:16    ED COURSE and MDM  Nursing notes and initial vitals signs, including pulse oximetry, reviewed.  Vitals:   10/11/18 0339 10/11/18 0350  BP:  133/70  Pulse:  67  Resp:  15  Temp:  98.3 F (36.8 C)  TempSrc:  Oral  SpO2: 98% 95%    PROCEDURES    ED DIAGNOSES     ICD-10-CM   1. Fall at nursing home, initial encounter W19.XXXA    Y92.129        Paula Libra, MD 10/11/18 1610    Paula Libra, MD 10/11/18 [phone]

## 2018-10-11 NOTE — ED Notes (Signed)
Bed: [facility] Expected date:  Expected time:  Means of arrival:  Comments: EMS 82 yo female from SNF/fall-neck pain/rash BP 190/80

## 2018-10-12 DIAGNOSIS — F0390 Unspecified dementia without behavioral disturbance: Secondary | ICD-10-CM | POA: Diagnosis not present

## 2018-10-13 ENCOUNTER — Emergency Department (HOSPITAL_COMMUNITY)
Admission: EM | Admit: 2018-10-13 | Discharge: 2018-10-13 | Disposition: A | Discharge status: Home / Self Care | Payer: Medicare HMO | Attending: Emergency Medicine | Admitting: Emergency Medicine

## 2018-10-13 ENCOUNTER — Encounter (HOSPITAL_COMMUNITY): Payer: Self-pay | Admitting: Emergency Medicine

## 2018-10-13 ENCOUNTER — Emergency Department (HOSPITAL_COMMUNITY): Payer: Medicare HMO

## 2018-10-13 ENCOUNTER — Other Ambulatory Visit: Payer: Self-pay

## 2018-10-13 DIAGNOSIS — W19XXXA Unspecified fall, initial encounter: Secondary | ICD-10-CM | POA: Insufficient documentation

## 2018-10-13 DIAGNOSIS — S0990XA Unspecified injury of head, initial encounter: Secondary | ICD-10-CM | POA: Diagnosis not present

## 2018-10-13 DIAGNOSIS — M2550 Pain in unspecified joint: Secondary | ICD-10-CM | POA: Diagnosis not present

## 2018-10-13 DIAGNOSIS — S0081XA Abrasion of other part of head, initial encounter: Secondary | ICD-10-CM | POA: Diagnosis not present

## 2018-10-13 DIAGNOSIS — Z87891 Personal history of nicotine dependence: Secondary | ICD-10-CM | POA: Insufficient documentation

## 2018-10-13 DIAGNOSIS — Y999 Unspecified external cause status: Secondary | ICD-10-CM | POA: Insufficient documentation

## 2018-10-13 DIAGNOSIS — S0083XA Contusion of other part of head, initial encounter: Secondary | ICD-10-CM | POA: Insufficient documentation

## 2018-10-13 DIAGNOSIS — Z7401 Bed confinement status: Secondary | ICD-10-CM | POA: Diagnosis not present

## 2018-10-13 DIAGNOSIS — Y92129 Unspecified place in nursing home as the place of occurrence of the external cause: Secondary | ICD-10-CM | POA: Diagnosis not present

## 2018-10-13 DIAGNOSIS — Z79899 Other long term (current) drug therapy: Secondary | ICD-10-CM | POA: Insufficient documentation

## 2018-10-13 DIAGNOSIS — S199XXA Unspecified injury of neck, initial encounter: Secondary | ICD-10-CM | POA: Diagnosis not present

## 2018-10-13 DIAGNOSIS — Y939 Activity, unspecified: Secondary | ICD-10-CM | POA: Diagnosis not present

## 2018-10-13 DIAGNOSIS — Z7982 Long term (current) use of aspirin: Secondary | ICD-10-CM | POA: Diagnosis not present

## 2018-10-13 DIAGNOSIS — F0390 Unspecified dementia without behavioral disturbance: Secondary | ICD-10-CM | POA: Insufficient documentation

## 2018-10-13 DIAGNOSIS — I10 Essential (primary) hypertension: Secondary | ICD-10-CM | POA: Insufficient documentation

## 2018-10-13 DIAGNOSIS — R410 Disorientation, unspecified: Secondary | ICD-10-CM | POA: Diagnosis not present

## 2018-10-13 NOTE — ED Notes (Signed)
Pt departed in NAD. Report given to [facility] transport crew and informed of inability to communicate with facility.

## 2018-10-13 NOTE — ED Notes (Signed)
Patient transported to CT 
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
## 2018-10-13 NOTE — ED Notes (Signed)
[facility] called for transport.  

## 2018-10-13 NOTE — ED Notes (Signed)
Attempted to call report to [facility], but only receiving automated message with voicemail. Left HIPPA-compliant message to return call.

## 2018-10-13 NOTE — ED Triage Notes (Signed)
Pt BIB [facility] from [facility] after an unwitnessed fall from bed. Hematoma above left eye. Hx subdural, not on blood thinners.

## 2018-10-13 NOTE — ED Notes (Signed)
Made 2nd attempt to call report to [facility], but again only received automated message with voicemail. Left second HIPPA-compliant message for facility to return call.

## 2018-10-14 DIAGNOSIS — F0390 Unspecified dementia without behavioral disturbance: Secondary | ICD-10-CM | POA: Diagnosis not present

## 2018-10-15 DIAGNOSIS — F0390 Unspecified dementia without behavioral disturbance: Secondary | ICD-10-CM | POA: Diagnosis not present

## 2018-10-16 DIAGNOSIS — F0390 Unspecified dementia without behavioral disturbance: Secondary | ICD-10-CM | POA: Diagnosis not present

## 2018-10-17 DIAGNOSIS — F331 Major depressive disorder, recurrent, moderate: Secondary | ICD-10-CM | POA: Diagnosis not present

## 2018-10-17 DIAGNOSIS — F064 Anxiety disorder due to known physiological condition: Secondary | ICD-10-CM | POA: Diagnosis not present

## 2018-10-17 DIAGNOSIS — F0150 Vascular dementia without behavioral disturbance: Secondary | ICD-10-CM | POA: Diagnosis not present

## 2018-10-22 DIAGNOSIS — R2689 Other abnormalities of gait and mobility: Secondary | ICD-10-CM | POA: Diagnosis not present

## 2018-10-24 DIAGNOSIS — R296 Repeated falls: Secondary | ICD-10-CM | POA: Diagnosis not present

## 2018-10-31 DIAGNOSIS — F419 Anxiety disorder, unspecified: Secondary | ICD-10-CM | POA: Diagnosis not present

## 2018-10-31 DIAGNOSIS — I10 Essential (primary) hypertension: Secondary | ICD-10-CM | POA: Diagnosis not present

## 2018-10-31 DIAGNOSIS — R2681 Unsteadiness on feet: Secondary | ICD-10-CM | POA: Diagnosis not present

## 2018-10-31 DIAGNOSIS — R634 Abnormal weight loss: Secondary | ICD-10-CM | POA: Diagnosis not present

## 2018-10-31 DIAGNOSIS — M545 Low back pain: Secondary | ICD-10-CM | POA: Diagnosis not present

## 2018-10-31 DIAGNOSIS — E785 Hyperlipidemia, unspecified: Secondary | ICD-10-CM | POA: Diagnosis not present

## 2018-10-31 DIAGNOSIS — E039 Hypothyroidism, unspecified: Secondary | ICD-10-CM | POA: Diagnosis not present

## 2018-10-31 DIAGNOSIS — G459 Transient cerebral ischemic attack, unspecified: Secondary | ICD-10-CM | POA: Diagnosis not present

## 2018-10-31 DIAGNOSIS — E782 Mixed hyperlipidemia: Secondary | ICD-10-CM | POA: Diagnosis not present

## 2018-10-31 DIAGNOSIS — R296 Repeated falls: Secondary | ICD-10-CM | POA: Diagnosis not present

## 2018-11-07 DIAGNOSIS — R159 Full incontinence of feces: Secondary | ICD-10-CM | POA: Diagnosis not present

## 2018-11-07 DIAGNOSIS — R32 Unspecified urinary incontinence: Secondary | ICD-10-CM | POA: Diagnosis not present

## 2018-11-07 DIAGNOSIS — R300 Dysuria: Secondary | ICD-10-CM | POA: Diagnosis not present

## 2018-11-07 DIAGNOSIS — K5900 Constipation, unspecified: Secondary | ICD-10-CM | POA: Diagnosis not present

## 2018-11-11 DIAGNOSIS — R300 Dysuria: Secondary | ICD-10-CM | POA: Diagnosis not present

## 2018-11-12 DIAGNOSIS — F064 Anxiety disorder due to known physiological condition: Secondary | ICD-10-CM | POA: Diagnosis not present

## 2018-11-12 DIAGNOSIS — F419 Anxiety disorder, unspecified: Secondary | ICD-10-CM | POA: Diagnosis not present

## 2018-11-12 DIAGNOSIS — F0150 Vascular dementia without behavioral disturbance: Secondary | ICD-10-CM | POA: Diagnosis not present

## 2018-11-12 DIAGNOSIS — F331 Major depressive disorder, recurrent, moderate: Secondary | ICD-10-CM | POA: Diagnosis not present

## 2018-11-16 DIAGNOSIS — F0390 Unspecified dementia without behavioral disturbance: Secondary | ICD-10-CM | POA: Diagnosis not present

## 2018-11-17 DIAGNOSIS — F0390 Unspecified dementia without behavioral disturbance: Secondary | ICD-10-CM | POA: Diagnosis not present

## 2018-11-18 DIAGNOSIS — F0390 Unspecified dementia without behavioral disturbance: Secondary | ICD-10-CM | POA: Diagnosis not present

## 2018-11-19 DIAGNOSIS — F0390 Unspecified dementia without behavioral disturbance: Secondary | ICD-10-CM | POA: Diagnosis not present

## 2018-11-20 DIAGNOSIS — F0390 Unspecified dementia without behavioral disturbance: Secondary | ICD-10-CM | POA: Diagnosis not present

## 2018-11-21 DIAGNOSIS — R2689 Other abnormalities of gait and mobility: Secondary | ICD-10-CM | POA: Diagnosis not present

## 2018-11-21 DIAGNOSIS — F0390 Unspecified dementia without behavioral disturbance: Secondary | ICD-10-CM | POA: Diagnosis not present

## 2018-11-22 DIAGNOSIS — F0390 Unspecified dementia without behavioral disturbance: Secondary | ICD-10-CM | POA: Diagnosis not present

## 2018-11-23 DIAGNOSIS — F0390 Unspecified dementia without behavioral disturbance: Secondary | ICD-10-CM | POA: Diagnosis not present

## 2018-11-24 DIAGNOSIS — F0390 Unspecified dementia without behavioral disturbance: Secondary | ICD-10-CM | POA: Diagnosis not present

## 2018-11-24 NOTE — Telephone Encounter (Signed)
Error

## 2018-11-25 DIAGNOSIS — F0390 Unspecified dementia without behavioral disturbance: Secondary | ICD-10-CM | POA: Diagnosis not present

## 2018-11-26 DIAGNOSIS — F0390 Unspecified dementia without behavioral disturbance: Secondary | ICD-10-CM | POA: Diagnosis not present

## 2018-11-27 DIAGNOSIS — F0390 Unspecified dementia without behavioral disturbance: Secondary | ICD-10-CM | POA: Diagnosis not present

## 2018-11-28 DIAGNOSIS — F0390 Unspecified dementia without behavioral disturbance: Secondary | ICD-10-CM | POA: Diagnosis not present

## 2018-11-28 DIAGNOSIS — F419 Anxiety disorder, unspecified: Secondary | ICD-10-CM | POA: Diagnosis not present

## 2018-11-29 DIAGNOSIS — F0390 Unspecified dementia without behavioral disturbance: Secondary | ICD-10-CM | POA: Diagnosis not present

## 2018-11-30 DIAGNOSIS — F0390 Unspecified dementia without behavioral disturbance: Secondary | ICD-10-CM | POA: Diagnosis not present

## 2018-12-01 DIAGNOSIS — F0390 Unspecified dementia without behavioral disturbance: Secondary | ICD-10-CM | POA: Diagnosis not present

## 2018-12-02 DIAGNOSIS — F0390 Unspecified dementia without behavioral disturbance: Secondary | ICD-10-CM | POA: Diagnosis not present

## 2018-12-03 DIAGNOSIS — F0390 Unspecified dementia without behavioral disturbance: Secondary | ICD-10-CM | POA: Diagnosis not present

## 2018-12-04 DIAGNOSIS — R159 Full incontinence of feces: Secondary | ICD-10-CM | POA: Diagnosis not present

## 2018-12-04 DIAGNOSIS — F0390 Unspecified dementia without behavioral disturbance: Secondary | ICD-10-CM | POA: Diagnosis not present

## 2018-12-04 DIAGNOSIS — R32 Unspecified urinary incontinence: Secondary | ICD-10-CM | POA: Diagnosis not present

## 2018-12-05 DIAGNOSIS — F0390 Unspecified dementia without behavioral disturbance: Secondary | ICD-10-CM | POA: Diagnosis not present

## 2018-12-05 DIAGNOSIS — B379 Candidiasis, unspecified: Secondary | ICD-10-CM | POA: Diagnosis not present

## 2018-12-06 DIAGNOSIS — F0390 Unspecified dementia without behavioral disturbance: Secondary | ICD-10-CM | POA: Diagnosis not present

## 2018-12-07 DIAGNOSIS — F0390 Unspecified dementia without behavioral disturbance: Secondary | ICD-10-CM | POA: Diagnosis not present

## 2018-12-08 DIAGNOSIS — F0390 Unspecified dementia without behavioral disturbance: Secondary | ICD-10-CM | POA: Diagnosis not present

## 2018-12-09 DIAGNOSIS — F0390 Unspecified dementia without behavioral disturbance: Secondary | ICD-10-CM | POA: Diagnosis not present

## 2018-12-10 DIAGNOSIS — F0390 Unspecified dementia without behavioral disturbance: Secondary | ICD-10-CM | POA: Diagnosis not present

## 2018-12-11 DIAGNOSIS — F0390 Unspecified dementia without behavioral disturbance: Secondary | ICD-10-CM | POA: Diagnosis not present

## 2018-12-12 DIAGNOSIS — F331 Major depressive disorder, recurrent, moderate: Secondary | ICD-10-CM | POA: Diagnosis not present

## 2018-12-12 DIAGNOSIS — H5213 Myopia, bilateral: Secondary | ICD-10-CM | POA: Diagnosis not present

## 2018-12-12 DIAGNOSIS — F0390 Unspecified dementia without behavioral disturbance: Secondary | ICD-10-CM | POA: Diagnosis not present

## 2018-12-12 DIAGNOSIS — F0150 Vascular dementia without behavioral disturbance: Secondary | ICD-10-CM | POA: Diagnosis not present

## 2018-12-12 DIAGNOSIS — H524 Presbyopia: Secondary | ICD-10-CM | POA: Diagnosis not present

## 2018-12-12 DIAGNOSIS — F064 Anxiety disorder due to known physiological condition: Secondary | ICD-10-CM | POA: Diagnosis not present

## 2018-12-12 DIAGNOSIS — H52209 Unspecified astigmatism, unspecified eye: Secondary | ICD-10-CM | POA: Diagnosis not present

## 2018-12-13 DIAGNOSIS — F0390 Unspecified dementia without behavioral disturbance: Secondary | ICD-10-CM | POA: Diagnosis not present

## 2018-12-14 DIAGNOSIS — F0390 Unspecified dementia without behavioral disturbance: Secondary | ICD-10-CM | POA: Diagnosis not present

## 2018-12-15 DIAGNOSIS — F0390 Unspecified dementia without behavioral disturbance: Secondary | ICD-10-CM | POA: Diagnosis not present

## 2018-12-16 DIAGNOSIS — F0390 Unspecified dementia without behavioral disturbance: Secondary | ICD-10-CM | POA: Diagnosis not present

## 2018-12-17 DIAGNOSIS — F0390 Unspecified dementia without behavioral disturbance: Secondary | ICD-10-CM | POA: Diagnosis not present

## 2018-12-18 DIAGNOSIS — F0390 Unspecified dementia without behavioral disturbance: Secondary | ICD-10-CM | POA: Diagnosis not present

## 2018-12-19 DIAGNOSIS — F0390 Unspecified dementia without behavioral disturbance: Secondary | ICD-10-CM | POA: Diagnosis not present

## 2018-12-20 DIAGNOSIS — F0390 Unspecified dementia without behavioral disturbance: Secondary | ICD-10-CM | POA: Diagnosis not present

## 2018-12-21 DIAGNOSIS — F0390 Unspecified dementia without behavioral disturbance: Secondary | ICD-10-CM | POA: Diagnosis not present

## 2018-12-22 DIAGNOSIS — F0390 Unspecified dementia without behavioral disturbance: Secondary | ICD-10-CM | POA: Diagnosis not present

## 2018-12-22 DIAGNOSIS — R2689 Other abnormalities of gait and mobility: Secondary | ICD-10-CM | POA: Diagnosis not present

## 2018-12-23 DIAGNOSIS — F0390 Unspecified dementia without behavioral disturbance: Secondary | ICD-10-CM | POA: Diagnosis not present

## 2018-12-24 DIAGNOSIS — Z961 Presence of intraocular lens: Secondary | ICD-10-CM | POA: Diagnosis not present

## 2018-12-24 DIAGNOSIS — F0390 Unspecified dementia without behavioral disturbance: Secondary | ICD-10-CM | POA: Diagnosis not present

## 2018-12-24 DIAGNOSIS — H01009 Unspecified blepharitis unspecified eye, unspecified eyelid: Secondary | ICD-10-CM | POA: Diagnosis not present

## 2018-12-24 DIAGNOSIS — H524 Presbyopia: Secondary | ICD-10-CM | POA: Diagnosis not present

## 2018-12-24 DIAGNOSIS — H02132 Senile ectropion of right lower eyelid: Secondary | ICD-10-CM | POA: Diagnosis not present

## 2018-12-24 DIAGNOSIS — H50111 Monocular exotropia, right eye: Secondary | ICD-10-CM | POA: Diagnosis not present

## 2018-12-24 DIAGNOSIS — H02135 Senile ectropion of left lower eyelid: Secondary | ICD-10-CM | POA: Diagnosis not present

## 2018-12-25 DIAGNOSIS — F0390 Unspecified dementia without behavioral disturbance: Secondary | ICD-10-CM | POA: Diagnosis not present

## 2018-12-26 DIAGNOSIS — F0390 Unspecified dementia without behavioral disturbance: Secondary | ICD-10-CM | POA: Diagnosis not present

## 2018-12-27 DIAGNOSIS — F0390 Unspecified dementia without behavioral disturbance: Secondary | ICD-10-CM | POA: Diagnosis not present

## 2018-12-28 DIAGNOSIS — F0390 Unspecified dementia without behavioral disturbance: Secondary | ICD-10-CM | POA: Diagnosis not present

## 2018-12-29 DIAGNOSIS — F0390 Unspecified dementia without behavioral disturbance: Secondary | ICD-10-CM | POA: Diagnosis not present

## 2018-12-30 DIAGNOSIS — F0390 Unspecified dementia without behavioral disturbance: Secondary | ICD-10-CM | POA: Diagnosis not present

## 2018-12-31 DIAGNOSIS — F0390 Unspecified dementia without behavioral disturbance: Secondary | ICD-10-CM | POA: Diagnosis not present

## 2019-01-01 DIAGNOSIS — F0390 Unspecified dementia without behavioral disturbance: Secondary | ICD-10-CM | POA: Diagnosis not present

## 2019-01-02 DIAGNOSIS — F0390 Unspecified dementia without behavioral disturbance: Secondary | ICD-10-CM | POA: Diagnosis not present

## 2019-01-03 DIAGNOSIS — F0390 Unspecified dementia without behavioral disturbance: Secondary | ICD-10-CM | POA: Diagnosis not present

## 2019-01-04 DIAGNOSIS — F0390 Unspecified dementia without behavioral disturbance: Secondary | ICD-10-CM | POA: Diagnosis not present

## 2019-01-05 DIAGNOSIS — F0390 Unspecified dementia without behavioral disturbance: Secondary | ICD-10-CM | POA: Diagnosis not present

## 2019-01-06 DIAGNOSIS — I10 Essential (primary) hypertension: Secondary | ICD-10-CM | POA: Diagnosis not present

## 2019-01-06 DIAGNOSIS — M545 Low back pain: Secondary | ICD-10-CM | POA: Diagnosis not present

## 2019-01-06 DIAGNOSIS — E782 Mixed hyperlipidemia: Secondary | ICD-10-CM | POA: Diagnosis not present

## 2019-01-06 DIAGNOSIS — G459 Transient cerebral ischemic attack, unspecified: Secondary | ICD-10-CM | POA: Diagnosis not present

## 2019-01-06 DIAGNOSIS — F0390 Unspecified dementia without behavioral disturbance: Secondary | ICD-10-CM | POA: Diagnosis not present

## 2019-01-06 DIAGNOSIS — R634 Abnormal weight loss: Secondary | ICD-10-CM | POA: Diagnosis not present

## 2019-01-06 DIAGNOSIS — R2681 Unsteadiness on feet: Secondary | ICD-10-CM | POA: Diagnosis not present

## 2019-01-06 DIAGNOSIS — E785 Hyperlipidemia, unspecified: Secondary | ICD-10-CM | POA: Diagnosis not present

## 2019-01-06 DIAGNOSIS — R296 Repeated falls: Secondary | ICD-10-CM | POA: Diagnosis not present

## 2019-01-06 DIAGNOSIS — E039 Hypothyroidism, unspecified: Secondary | ICD-10-CM | POA: Diagnosis not present

## 2019-01-07 DIAGNOSIS — F0150 Vascular dementia without behavioral disturbance: Secondary | ICD-10-CM | POA: Diagnosis not present

## 2019-01-07 DIAGNOSIS — F064 Anxiety disorder due to known physiological condition: Secondary | ICD-10-CM | POA: Diagnosis not present

## 2019-01-07 DIAGNOSIS — F0390 Unspecified dementia without behavioral disturbance: Secondary | ICD-10-CM | POA: Diagnosis not present

## 2019-01-07 DIAGNOSIS — F331 Major depressive disorder, recurrent, moderate: Secondary | ICD-10-CM | POA: Diagnosis not present

## 2019-01-08 DIAGNOSIS — F0390 Unspecified dementia without behavioral disturbance: Secondary | ICD-10-CM | POA: Diagnosis not present

## 2019-01-08 DIAGNOSIS — R159 Full incontinence of feces: Secondary | ICD-10-CM | POA: Diagnosis not present

## 2019-01-08 DIAGNOSIS — R32 Unspecified urinary incontinence: Secondary | ICD-10-CM | POA: Diagnosis not present

## 2019-01-09 DIAGNOSIS — F0390 Unspecified dementia without behavioral disturbance: Secondary | ICD-10-CM | POA: Diagnosis not present

## 2019-01-10 DIAGNOSIS — F0390 Unspecified dementia without behavioral disturbance: Secondary | ICD-10-CM | POA: Diagnosis not present

## 2019-01-11 DIAGNOSIS — F0390 Unspecified dementia without behavioral disturbance: Secondary | ICD-10-CM | POA: Diagnosis not present

## 2019-01-12 DIAGNOSIS — F0390 Unspecified dementia without behavioral disturbance: Secondary | ICD-10-CM | POA: Diagnosis not present

## 2019-01-13 DIAGNOSIS — F0390 Unspecified dementia without behavioral disturbance: Secondary | ICD-10-CM | POA: Diagnosis not present

## 2019-01-14 DIAGNOSIS — F0390 Unspecified dementia without behavioral disturbance: Secondary | ICD-10-CM | POA: Diagnosis not present

## 2019-01-15 DIAGNOSIS — F0390 Unspecified dementia without behavioral disturbance: Secondary | ICD-10-CM | POA: Diagnosis not present

## 2019-01-16 DIAGNOSIS — Z0000 Encounter for general adult medical examination without abnormal findings: Secondary | ICD-10-CM | POA: Diagnosis not present

## 2019-01-16 DIAGNOSIS — F0390 Unspecified dementia without behavioral disturbance: Secondary | ICD-10-CM | POA: Diagnosis not present

## 2019-01-17 DIAGNOSIS — F0390 Unspecified dementia without behavioral disturbance: Secondary | ICD-10-CM | POA: Diagnosis not present

## 2019-01-18 DIAGNOSIS — F0390 Unspecified dementia without behavioral disturbance: Secondary | ICD-10-CM | POA: Diagnosis not present

## 2019-01-19 DIAGNOSIS — F0390 Unspecified dementia without behavioral disturbance: Secondary | ICD-10-CM | POA: Diagnosis not present

## 2019-01-20 DIAGNOSIS — F0390 Unspecified dementia without behavioral disturbance: Secondary | ICD-10-CM | POA: Diagnosis not present

## 2019-01-21 DIAGNOSIS — F0390 Unspecified dementia without behavioral disturbance: Secondary | ICD-10-CM | POA: Diagnosis not present

## 2019-01-22 DIAGNOSIS — R2689 Other abnormalities of gait and mobility: Secondary | ICD-10-CM | POA: Diagnosis not present

## 2019-01-22 DIAGNOSIS — F0390 Unspecified dementia without behavioral disturbance: Secondary | ICD-10-CM | POA: Diagnosis not present

## 2019-01-23 DIAGNOSIS — F419 Anxiety disorder, unspecified: Secondary | ICD-10-CM | POA: Diagnosis not present

## 2019-01-23 DIAGNOSIS — F0390 Unspecified dementia without behavioral disturbance: Secondary | ICD-10-CM | POA: Diagnosis not present

## 2019-01-24 DIAGNOSIS — F0390 Unspecified dementia without behavioral disturbance: Secondary | ICD-10-CM | POA: Diagnosis not present

## 2019-01-25 DIAGNOSIS — F0390 Unspecified dementia without behavioral disturbance: Secondary | ICD-10-CM | POA: Diagnosis not present

## 2019-01-26 DIAGNOSIS — F0390 Unspecified dementia without behavioral disturbance: Secondary | ICD-10-CM | POA: Diagnosis not present

## 2019-01-27 DIAGNOSIS — F0390 Unspecified dementia without behavioral disturbance: Secondary | ICD-10-CM | POA: Diagnosis not present

## 2019-01-28 DIAGNOSIS — F0390 Unspecified dementia without behavioral disturbance: Secondary | ICD-10-CM | POA: Diagnosis not present

## 2019-01-29 DIAGNOSIS — F0390 Unspecified dementia without behavioral disturbance: Secondary | ICD-10-CM | POA: Diagnosis not present

## 2019-01-30 DIAGNOSIS — F0390 Unspecified dementia without behavioral disturbance: Secondary | ICD-10-CM | POA: Diagnosis not present

## 2019-01-31 DIAGNOSIS — F0390 Unspecified dementia without behavioral disturbance: Secondary | ICD-10-CM | POA: Diagnosis not present

## 2019-02-01 DIAGNOSIS — F0390 Unspecified dementia without behavioral disturbance: Secondary | ICD-10-CM | POA: Diagnosis not present

## 2019-02-02 DIAGNOSIS — F0390 Unspecified dementia without behavioral disturbance: Secondary | ICD-10-CM | POA: Diagnosis not present

## 2019-02-03 DIAGNOSIS — F0390 Unspecified dementia without behavioral disturbance: Secondary | ICD-10-CM | POA: Diagnosis not present

## 2019-02-03 DIAGNOSIS — F064 Anxiety disorder due to known physiological condition: Secondary | ICD-10-CM | POA: Diagnosis not present

## 2019-02-03 DIAGNOSIS — F0150 Vascular dementia without behavioral disturbance: Secondary | ICD-10-CM | POA: Diagnosis not present

## 2019-02-03 DIAGNOSIS — F331 Major depressive disorder, recurrent, moderate: Secondary | ICD-10-CM | POA: Diagnosis not present

## 2019-02-04 DIAGNOSIS — F0390 Unspecified dementia without behavioral disturbance: Secondary | ICD-10-CM | POA: Diagnosis not present

## 2019-02-05 DIAGNOSIS — F0390 Unspecified dementia without behavioral disturbance: Secondary | ICD-10-CM | POA: Diagnosis not present

## 2019-02-06 DIAGNOSIS — F0390 Unspecified dementia without behavioral disturbance: Secondary | ICD-10-CM | POA: Diagnosis not present

## 2019-02-07 DIAGNOSIS — F0390 Unspecified dementia without behavioral disturbance: Secondary | ICD-10-CM | POA: Diagnosis not present

## 2019-02-08 DIAGNOSIS — F0390 Unspecified dementia without behavioral disturbance: Secondary | ICD-10-CM | POA: Diagnosis not present

## 2019-02-09 DIAGNOSIS — F0390 Unspecified dementia without behavioral disturbance: Secondary | ICD-10-CM | POA: Diagnosis not present

## 2019-02-10 DIAGNOSIS — R159 Full incontinence of feces: Secondary | ICD-10-CM | POA: Diagnosis not present

## 2019-02-10 DIAGNOSIS — F0390 Unspecified dementia without behavioral disturbance: Secondary | ICD-10-CM | POA: Diagnosis not present

## 2019-02-10 DIAGNOSIS — R32 Unspecified urinary incontinence: Secondary | ICD-10-CM | POA: Diagnosis not present

## 2019-02-11 DIAGNOSIS — F0390 Unspecified dementia without behavioral disturbance: Secondary | ICD-10-CM | POA: Diagnosis not present

## 2019-02-12 DIAGNOSIS — F0390 Unspecified dementia without behavioral disturbance: Secondary | ICD-10-CM | POA: Diagnosis not present

## 2019-02-13 DIAGNOSIS — F0390 Unspecified dementia without behavioral disturbance: Secondary | ICD-10-CM | POA: Diagnosis not present

## 2019-02-13 DIAGNOSIS — E039 Hypothyroidism, unspecified: Secondary | ICD-10-CM | POA: Diagnosis not present

## 2019-02-13 DIAGNOSIS — R634 Abnormal weight loss: Secondary | ICD-10-CM | POA: Diagnosis not present

## 2019-02-13 DIAGNOSIS — M545 Low back pain: Secondary | ICD-10-CM | POA: Diagnosis not present

## 2019-02-13 DIAGNOSIS — E785 Hyperlipidemia, unspecified: Secondary | ICD-10-CM | POA: Diagnosis not present

## 2019-02-13 DIAGNOSIS — R296 Repeated falls: Secondary | ICD-10-CM | POA: Diagnosis not present

## 2019-02-13 DIAGNOSIS — R2681 Unsteadiness on feet: Secondary | ICD-10-CM | POA: Diagnosis not present

## 2019-02-13 DIAGNOSIS — G459 Transient cerebral ischemic attack, unspecified: Secondary | ICD-10-CM | POA: Diagnosis not present

## 2019-02-13 DIAGNOSIS — I10 Essential (primary) hypertension: Secondary | ICD-10-CM | POA: Diagnosis not present

## 2019-02-13 DIAGNOSIS — E782 Mixed hyperlipidemia: Secondary | ICD-10-CM | POA: Diagnosis not present

## 2019-02-14 DIAGNOSIS — F0390 Unspecified dementia without behavioral disturbance: Secondary | ICD-10-CM | POA: Diagnosis not present

## 2019-02-20 DIAGNOSIS — R2689 Other abnormalities of gait and mobility: Secondary | ICD-10-CM | POA: Diagnosis not present

## 2019-02-20 DIAGNOSIS — F419 Anxiety disorder, unspecified: Secondary | ICD-10-CM | POA: Diagnosis not present

## 2019-02-22 ENCOUNTER — Encounter (HOSPITAL_COMMUNITY): Payer: Self-pay | Admitting: Emergency Medicine

## 2019-02-22 ENCOUNTER — Emergency Department (HOSPITAL_COMMUNITY)

## 2019-02-22 ENCOUNTER — Emergency Department (HOSPITAL_COMMUNITY)
Admission: EM | Admit: 2019-02-22 | Discharge: 2019-02-22 | Disposition: A | Discharge status: Home / Self Care | Attending: Emergency Medicine | Admitting: Emergency Medicine

## 2019-02-22 DIAGNOSIS — Z79899 Other long term (current) drug therapy: Secondary | ICD-10-CM | POA: Insufficient documentation

## 2019-02-22 DIAGNOSIS — S0990XA Unspecified injury of head, initial encounter: Secondary | ICD-10-CM | POA: Diagnosis present

## 2019-02-22 DIAGNOSIS — I10 Essential (primary) hypertension: Secondary | ICD-10-CM | POA: Diagnosis not present

## 2019-02-22 DIAGNOSIS — Y939 Activity, unspecified: Secondary | ICD-10-CM | POA: Insufficient documentation

## 2019-02-22 DIAGNOSIS — R404 Transient alteration of awareness: Secondary | ICD-10-CM | POA: Diagnosis not present

## 2019-02-22 DIAGNOSIS — Z87891 Personal history of nicotine dependence: Secondary | ICD-10-CM | POA: Insufficient documentation

## 2019-02-22 DIAGNOSIS — W19XXXA Unspecified fall, initial encounter: Secondary | ICD-10-CM | POA: Diagnosis not present

## 2019-02-22 DIAGNOSIS — S0083XA Contusion of other part of head, initial encounter: Secondary | ICD-10-CM | POA: Insufficient documentation

## 2019-02-22 DIAGNOSIS — I499 Cardiac arrhythmia, unspecified: Secondary | ICD-10-CM | POA: Diagnosis not present

## 2019-02-22 DIAGNOSIS — F0390 Unspecified dementia without behavioral disturbance: Secondary | ICD-10-CM | POA: Diagnosis not present

## 2019-02-22 DIAGNOSIS — Z8673 Personal history of transient ischemic attack (TIA), and cerebral infarction without residual deficits: Secondary | ICD-10-CM | POA: Diagnosis not present

## 2019-02-22 DIAGNOSIS — R279 Unspecified lack of coordination: Secondary | ICD-10-CM | POA: Diagnosis not present

## 2019-02-22 DIAGNOSIS — Z7982 Long term (current) use of aspirin: Secondary | ICD-10-CM | POA: Insufficient documentation

## 2019-02-22 DIAGNOSIS — R52 Pain, unspecified: Secondary | ICD-10-CM | POA: Diagnosis not present

## 2019-02-22 DIAGNOSIS — Z743 Need for continuous supervision: Secondary | ICD-10-CM | POA: Diagnosis not present

## 2019-02-22 DIAGNOSIS — Y92122 Bedroom in nursing home as the place of occurrence of the external cause: Secondary | ICD-10-CM | POA: Diagnosis not present

## 2019-02-22 DIAGNOSIS — Y998 Other external cause status: Secondary | ICD-10-CM | POA: Diagnosis not present

## 2019-02-22 DIAGNOSIS — R0689 Other abnormalities of breathing: Secondary | ICD-10-CM | POA: Diagnosis not present
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
## 2019-02-22 NOTE — ED Notes (Signed)
Bed: OZ22 Expected date:  Expected time:  Means of arrival:  Comments: Ems fall

## 2019-02-22 NOTE — ED Triage Notes (Addendum)
Patient here from [place] with complaints of unwitnessed fall. Dementia. Screams out in pain, unable to localize. Alert to self. Able to answer yes or no questions.

## 2019-03-04 DIAGNOSIS — F331 Major depressive disorder, recurrent, moderate: Secondary | ICD-10-CM | POA: Diagnosis not present

## 2019-03-04 DIAGNOSIS — F0150 Vascular dementia without behavioral disturbance: Secondary | ICD-10-CM | POA: Diagnosis not present

## 2019-03-04 DIAGNOSIS — F064 Anxiety disorder due to known physiological condition: Secondary | ICD-10-CM | POA: Diagnosis not present

## 2019-03-18 DIAGNOSIS — F419 Anxiety disorder, unspecified: Secondary | ICD-10-CM | POA: Diagnosis not present

## 2019-03-20 DIAGNOSIS — R296 Repeated falls: Secondary | ICD-10-CM | POA: Diagnosis not present

## 2019-03-20 DIAGNOSIS — M545 Low back pain: Secondary | ICD-10-CM | POA: Diagnosis not present

## 2019-03-20 DIAGNOSIS — R634 Abnormal weight loss: Secondary | ICD-10-CM | POA: Diagnosis not present

## 2019-03-20 DIAGNOSIS — E782 Mixed hyperlipidemia: Secondary | ICD-10-CM | POA: Diagnosis not present

## 2019-03-20 DIAGNOSIS — E785 Hyperlipidemia, unspecified: Secondary | ICD-10-CM | POA: Diagnosis not present

## 2019-03-20 DIAGNOSIS — I10 Essential (primary) hypertension: Secondary | ICD-10-CM | POA: Diagnosis not present

## 2019-03-20 DIAGNOSIS — R2681 Unsteadiness on feet: Secondary | ICD-10-CM | POA: Diagnosis not present

## 2019-03-20 DIAGNOSIS — G459 Transient cerebral ischemic attack, unspecified: Secondary | ICD-10-CM | POA: Diagnosis not present

## 2019-03-20 DIAGNOSIS — E039 Hypothyroidism, unspecified: Secondary | ICD-10-CM | POA: Diagnosis not present

## 2019-03-23 DIAGNOSIS — R2689 Other abnormalities of gait and mobility: Secondary | ICD-10-CM | POA: Diagnosis not present

## 2019-04-01 DIAGNOSIS — F0150 Vascular dementia without behavioral disturbance: Secondary | ICD-10-CM | POA: Diagnosis not present

## 2019-04-01 DIAGNOSIS — F331 Major depressive disorder, recurrent, moderate: Secondary | ICD-10-CM | POA: Diagnosis not present

## 2019-04-01 DIAGNOSIS — F064 Anxiety disorder due to known physiological condition: Secondary | ICD-10-CM | POA: Diagnosis not present

## 2019-04-15 DIAGNOSIS — R296 Repeated falls: Secondary | ICD-10-CM | POA: Diagnosis not present

## 2019-04-15 DIAGNOSIS — M545 Low back pain: Secondary | ICD-10-CM | POA: Diagnosis not present

## 2019-04-15 DIAGNOSIS — R2681 Unsteadiness on feet: Secondary | ICD-10-CM | POA: Diagnosis not present

## 2019-04-15 DIAGNOSIS — E039 Hypothyroidism, unspecified: Secondary | ICD-10-CM | POA: Diagnosis not present

## 2019-04-15 DIAGNOSIS — R634 Abnormal weight loss: Secondary | ICD-10-CM | POA: Diagnosis not present

## 2019-04-15 DIAGNOSIS — I10 Essential (primary) hypertension: Secondary | ICD-10-CM | POA: Diagnosis not present

## 2019-04-15 DIAGNOSIS — E782 Mixed hyperlipidemia: Secondary | ICD-10-CM | POA: Diagnosis not present

## 2019-04-15 DIAGNOSIS — G459 Transient cerebral ischemic attack, unspecified: Secondary | ICD-10-CM | POA: Diagnosis not present

## 2019-04-15 DIAGNOSIS — E785 Hyperlipidemia, unspecified: Secondary | ICD-10-CM | POA: Diagnosis not present

## 2019-04-15 DIAGNOSIS — F419 Anxiety disorder, unspecified: Secondary | ICD-10-CM | POA: Diagnosis not present

## 2019-04-17 DIAGNOSIS — R634 Abnormal weight loss: Secondary | ICD-10-CM | POA: Diagnosis not present

## 2019-04-17 DIAGNOSIS — R2681 Unsteadiness on feet: Secondary | ICD-10-CM | POA: Diagnosis not present

## 2019-04-17 DIAGNOSIS — E785 Hyperlipidemia, unspecified: Secondary | ICD-10-CM | POA: Diagnosis not present

## 2019-04-17 DIAGNOSIS — E039 Hypothyroidism, unspecified: Secondary | ICD-10-CM | POA: Diagnosis not present

## 2019-04-17 DIAGNOSIS — I10 Essential (primary) hypertension: Secondary | ICD-10-CM | POA: Diagnosis not present

## 2019-04-17 DIAGNOSIS — G459 Transient cerebral ischemic attack, unspecified: Secondary | ICD-10-CM | POA: Diagnosis not present

## 2019-04-17 DIAGNOSIS — E782 Mixed hyperlipidemia: Secondary | ICD-10-CM | POA: Diagnosis not present

## 2019-04-17 DIAGNOSIS — F0390 Unspecified dementia without behavioral disturbance: Secondary | ICD-10-CM | POA: Diagnosis not present

## 2019-04-17 DIAGNOSIS — R296 Repeated falls: Secondary | ICD-10-CM | POA: Diagnosis not present

## 2019-04-17 DIAGNOSIS — M545 Low back pain: Secondary | ICD-10-CM | POA: Diagnosis not present

## 2019-04-18 DIAGNOSIS — F0390 Unspecified dementia without behavioral disturbance: Secondary | ICD-10-CM | POA: Diagnosis not present

## 2019-04-19 DIAGNOSIS — F0390 Unspecified dementia without behavioral disturbance: Secondary | ICD-10-CM | POA: Diagnosis not present

## 2019-04-20 DIAGNOSIS — F0390 Unspecified dementia without behavioral disturbance: Secondary | ICD-10-CM | POA: Diagnosis not present

## 2019-04-21 DIAGNOSIS — F0390 Unspecified dementia without behavioral disturbance: Secondary | ICD-10-CM | POA: Diagnosis not present

## 2019-04-22 DIAGNOSIS — F0390 Unspecified dementia without behavioral disturbance: Secondary | ICD-10-CM | POA: Diagnosis not present

## 2019-04-22 DIAGNOSIS — R2689 Other abnormalities of gait and mobility: Secondary | ICD-10-CM | POA: Diagnosis not present

## 2019-04-23 DIAGNOSIS — F0390 Unspecified dementia without behavioral disturbance: Secondary | ICD-10-CM | POA: Diagnosis not present

## 2019-04-24 DIAGNOSIS — R296 Repeated falls: Secondary | ICD-10-CM | POA: Diagnosis not present

## 2019-04-24 DIAGNOSIS — F0390 Unspecified dementia without behavioral disturbance: Secondary | ICD-10-CM | POA: Diagnosis not present

## 2019-04-25 DIAGNOSIS — F0390 Unspecified dementia without behavioral disturbance: Secondary | ICD-10-CM | POA: Diagnosis not present

## 2019-04-26 DIAGNOSIS — F0390 Unspecified dementia without behavioral disturbance: Secondary | ICD-10-CM | POA: Diagnosis not present

## 2019-04-27 DIAGNOSIS — F0390 Unspecified dementia without behavioral disturbance: Secondary | ICD-10-CM | POA: Diagnosis not present

## 2019-04-28 DIAGNOSIS — F0390 Unspecified dementia without behavioral disturbance: Secondary | ICD-10-CM | POA: Diagnosis not present

## 2019-04-29 DIAGNOSIS — F331 Major depressive disorder, recurrent, moderate: Secondary | ICD-10-CM | POA: Diagnosis not present

## 2019-04-29 DIAGNOSIS — F0150 Vascular dementia without behavioral disturbance: Secondary | ICD-10-CM | POA: Diagnosis not present

## 2019-04-29 DIAGNOSIS — F0390 Unspecified dementia without behavioral disturbance: Secondary | ICD-10-CM | POA: Diagnosis not present

## 2019-04-29 DIAGNOSIS — F064 Anxiety disorder due to known physiological condition: Secondary | ICD-10-CM | POA: Diagnosis not present

## 2019-04-30 DIAGNOSIS — F0390 Unspecified dementia without behavioral disturbance: Secondary | ICD-10-CM | POA: Diagnosis not present

## 2019-05-01 DIAGNOSIS — F0390 Unspecified dementia without behavioral disturbance: Secondary | ICD-10-CM | POA: Diagnosis not present

## 2019-05-02 DIAGNOSIS — F0390 Unspecified dementia without behavioral disturbance: Secondary | ICD-10-CM | POA: Diagnosis not present

## 2019-05-03 DIAGNOSIS — F0390 Unspecified dementia without behavioral disturbance: Secondary | ICD-10-CM | POA: Diagnosis not present

## 2019-05-04 DIAGNOSIS — F0390 Unspecified dementia without behavioral disturbance: Secondary | ICD-10-CM | POA: Diagnosis not present

## 2019-05-05 DIAGNOSIS — F0390 Unspecified dementia without behavioral disturbance: Secondary | ICD-10-CM | POA: Diagnosis not present

## 2019-05-06 DIAGNOSIS — F0390 Unspecified dementia without behavioral disturbance: Secondary | ICD-10-CM | POA: Diagnosis not present

## 2019-05-07 DIAGNOSIS — F0390 Unspecified dementia without behavioral disturbance: Secondary | ICD-10-CM | POA: Diagnosis not present

## 2019-05-08 DIAGNOSIS — F0390 Unspecified dementia without behavioral disturbance: Secondary | ICD-10-CM | POA: Diagnosis not present

## 2019-05-09 DIAGNOSIS — F0390 Unspecified dementia without behavioral disturbance: Secondary | ICD-10-CM | POA: Diagnosis not present

## 2019-05-10 DIAGNOSIS — F0390 Unspecified dementia without behavioral disturbance: Secondary | ICD-10-CM | POA: Diagnosis not present

## 2019-05-11 DIAGNOSIS — F0390 Unspecified dementia without behavioral disturbance: Secondary | ICD-10-CM | POA: Diagnosis not present

## 2019-05-12 DIAGNOSIS — F0390 Unspecified dementia without behavioral disturbance: Secondary | ICD-10-CM | POA: Diagnosis not present

## 2019-05-13 DIAGNOSIS — F0390 Unspecified dementia without behavioral disturbance: Secondary | ICD-10-CM | POA: Diagnosis not present

## 2019-05-14 DIAGNOSIS — F0390 Unspecified dementia without behavioral disturbance: Secondary | ICD-10-CM | POA: Diagnosis not present

## 2019-05-14 DIAGNOSIS — E782 Mixed hyperlipidemia: Secondary | ICD-10-CM | POA: Diagnosis not present

## 2019-05-14 DIAGNOSIS — E039 Hypothyroidism, unspecified: Secondary | ICD-10-CM | POA: Diagnosis not present

## 2019-05-14 DIAGNOSIS — I10 Essential (primary) hypertension: Secondary | ICD-10-CM | POA: Diagnosis not present

## 2019-05-15 DIAGNOSIS — F0390 Unspecified dementia without behavioral disturbance: Secondary | ICD-10-CM | POA: Diagnosis not present

## 2019-05-16 DIAGNOSIS — F0390 Unspecified dementia without behavioral disturbance: Secondary | ICD-10-CM | POA: Diagnosis not present

## 2019-05-17 DIAGNOSIS — F0390 Unspecified dementia without behavioral disturbance: Secondary | ICD-10-CM | POA: Diagnosis not present

## 2019-05-18 DIAGNOSIS — F0390 Unspecified dementia without behavioral disturbance: Secondary | ICD-10-CM | POA: Diagnosis not present

## 2019-05-19 DIAGNOSIS — F0390 Unspecified dementia without behavioral disturbance: Secondary | ICD-10-CM | POA: Diagnosis not present

## 2019-05-20 DIAGNOSIS — F0390 Unspecified dementia without behavioral disturbance: Secondary | ICD-10-CM | POA: Diagnosis not present

## 2019-05-21 DIAGNOSIS — F0390 Unspecified dementia without behavioral disturbance: Secondary | ICD-10-CM | POA: Diagnosis not present

## 2019-05-22 DIAGNOSIS — F0390 Unspecified dementia without behavioral disturbance: Secondary | ICD-10-CM | POA: Diagnosis not present

## 2019-05-23 DIAGNOSIS — F0390 Unspecified dementia without behavioral disturbance: Secondary | ICD-10-CM | POA: Diagnosis not present

## 2019-05-24 DIAGNOSIS — F0390 Unspecified dementia without behavioral disturbance: Secondary | ICD-10-CM | POA: Diagnosis not present

## 2019-05-25 DIAGNOSIS — F0390 Unspecified dementia without behavioral disturbance: Secondary | ICD-10-CM | POA: Diagnosis not present

## 2019-05-26 DIAGNOSIS — F0390 Unspecified dementia without behavioral disturbance: Secondary | ICD-10-CM | POA: Diagnosis not present

## 2019-05-27 DIAGNOSIS — Z20828 Contact with and (suspected) exposure to other viral communicable diseases: Secondary | ICD-10-CM | POA: Diagnosis not present

## 2019-05-27 DIAGNOSIS — F0390 Unspecified dementia without behavioral disturbance: Secondary | ICD-10-CM | POA: Diagnosis not present

## 2019-05-28 DIAGNOSIS — F0390 Unspecified dementia without behavioral disturbance: Secondary | ICD-10-CM | POA: Diagnosis not present

## 2019-05-28 DIAGNOSIS — Z20828 Contact with and (suspected) exposure to other viral communicable diseases: Secondary | ICD-10-CM | POA: Diagnosis not present

## 2019-05-29 DIAGNOSIS — R2681 Unsteadiness on feet: Secondary | ICD-10-CM | POA: Diagnosis not present

## 2019-05-29 DIAGNOSIS — G459 Transient cerebral ischemic attack, unspecified: Secondary | ICD-10-CM | POA: Diagnosis not present

## 2019-05-29 DIAGNOSIS — E782 Mixed hyperlipidemia: Secondary | ICD-10-CM | POA: Diagnosis not present

## 2019-05-29 DIAGNOSIS — E785 Hyperlipidemia, unspecified: Secondary | ICD-10-CM | POA: Diagnosis not present

## 2019-05-29 DIAGNOSIS — E039 Hypothyroidism, unspecified: Secondary | ICD-10-CM | POA: Diagnosis not present

## 2019-05-29 DIAGNOSIS — R634 Abnormal weight loss: Secondary | ICD-10-CM | POA: Diagnosis not present

## 2019-05-29 DIAGNOSIS — M545 Low back pain: Secondary | ICD-10-CM | POA: Diagnosis not present

## 2019-05-29 DIAGNOSIS — I10 Essential (primary) hypertension: Secondary | ICD-10-CM | POA: Diagnosis not present

## 2019-05-29 DIAGNOSIS — R296 Repeated falls: Secondary | ICD-10-CM | POA: Diagnosis not present

## 2019-05-29 DIAGNOSIS — F0390 Unspecified dementia without behavioral disturbance: Secondary | ICD-10-CM | POA: Diagnosis not present

## 2019-05-30 DIAGNOSIS — F0390 Unspecified dementia without behavioral disturbance: Secondary | ICD-10-CM | POA: Diagnosis not present

## 2019-05-31 DIAGNOSIS — F0390 Unspecified dementia without behavioral disturbance: Secondary | ICD-10-CM | POA: Diagnosis not present

## 2019-06-01 DIAGNOSIS — F0390 Unspecified dementia without behavioral disturbance: Secondary | ICD-10-CM | POA: Diagnosis not present

## 2019-06-02 DIAGNOSIS — F0390 Unspecified dementia without behavioral disturbance: Secondary | ICD-10-CM | POA: Diagnosis not present

## 2019-06-03 DIAGNOSIS — F0390 Unspecified dementia without behavioral disturbance: Secondary | ICD-10-CM | POA: Diagnosis not present

## 2019-06-04 DIAGNOSIS — F0390 Unspecified dementia without behavioral disturbance: Secondary | ICD-10-CM | POA: Diagnosis not present

## 2019-06-05 DIAGNOSIS — F0390 Unspecified dementia without behavioral disturbance: Secondary | ICD-10-CM | POA: Diagnosis not present

## 2019-06-06 DIAGNOSIS — F0390 Unspecified dementia without behavioral disturbance: Secondary | ICD-10-CM | POA: Diagnosis not present

## 2019-06-07 DIAGNOSIS — F0390 Unspecified dementia without behavioral disturbance: Secondary | ICD-10-CM | POA: Diagnosis not present

## 2019-06-08 DIAGNOSIS — F0390 Unspecified dementia without behavioral disturbance: Secondary | ICD-10-CM | POA: Diagnosis not present

## 2019-06-09 DIAGNOSIS — F0390 Unspecified dementia without behavioral disturbance: Secondary | ICD-10-CM | POA: Diagnosis not present

## 2019-06-10 DIAGNOSIS — F0390 Unspecified dementia without behavioral disturbance: Secondary | ICD-10-CM | POA: Diagnosis not present

## 2019-06-11 DIAGNOSIS — F0390 Unspecified dementia without behavioral disturbance: Secondary | ICD-10-CM | POA: Diagnosis not present

## 2019-06-12 ENCOUNTER — Emergency Department (HOSPITAL_COMMUNITY)

## 2019-06-12 ENCOUNTER — Emergency Department (HOSPITAL_COMMUNITY)
Admission: EM | Admit: 2019-06-12 | Discharge: 2019-06-12 | Disposition: A | Discharge status: Hospice | Attending: Emergency Medicine | Admitting: Emergency Medicine

## 2019-06-12 ENCOUNTER — Encounter (HOSPITAL_COMMUNITY): Payer: Self-pay | Admitting: Emergency Medicine

## 2019-06-12 DIAGNOSIS — R52 Pain, unspecified: Secondary | ICD-10-CM | POA: Diagnosis not present

## 2019-06-12 DIAGNOSIS — Z79899 Other long term (current) drug therapy: Secondary | ICD-10-CM | POA: Insufficient documentation

## 2019-06-12 DIAGNOSIS — Z87891 Personal history of nicotine dependence: Secondary | ICD-10-CM | POA: Diagnosis not present

## 2019-06-12 DIAGNOSIS — Z043 Encounter for examination and observation following other accident: Secondary | ICD-10-CM | POA: Diagnosis not present

## 2019-06-12 DIAGNOSIS — F0390 Unspecified dementia without behavioral disturbance: Secondary | ICD-10-CM | POA: Diagnosis not present

## 2019-06-12 DIAGNOSIS — Y939 Activity, unspecified: Secondary | ICD-10-CM | POA: Diagnosis not present

## 2019-06-12 DIAGNOSIS — Z7401 Bed confinement status: Secondary | ICD-10-CM | POA: Diagnosis not present

## 2019-06-12 DIAGNOSIS — Z8673 Personal history of transient ischemic attack (TIA), and cerebral infarction without residual deficits: Secondary | ICD-10-CM | POA: Insufficient documentation

## 2019-06-12 DIAGNOSIS — Z7982 Long term (current) use of aspirin: Secondary | ICD-10-CM | POA: Insufficient documentation

## 2019-06-12 DIAGNOSIS — W19XXXA Unspecified fall, initial encounter: Secondary | ICD-10-CM | POA: Diagnosis not present

## 2019-06-12 DIAGNOSIS — Y92129 Unspecified place in nursing home as the place of occurrence of the external cause: Secondary | ICD-10-CM | POA: Insufficient documentation

## 2019-06-12 DIAGNOSIS — M2550 Pain in unspecified joint: Secondary | ICD-10-CM | POA: Diagnosis not present

## 2019-06-12 DIAGNOSIS — Y998 Other external cause status: Secondary | ICD-10-CM | POA: Diagnosis not present

## 2019-06-12 DIAGNOSIS — M5489 Other dorsalgia: Secondary | ICD-10-CM | POA: Diagnosis not present

## 2019-06-12 DIAGNOSIS — I10 Essential (primary) hypertension: Secondary | ICD-10-CM | POA: Insufficient documentation

## 2019-06-12 DIAGNOSIS — R531 Weakness: Secondary | ICD-10-CM | POA: Diagnosis not present

## 2019-06-12 DIAGNOSIS — S8012XA Contusion of left lower leg, initial encounter: Secondary | ICD-10-CM | POA: Diagnosis not present

## 2019-06-12 DIAGNOSIS — S8011XA Contusion of right lower leg, initial encounter: Secondary | ICD-10-CM | POA: Diagnosis not present
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
## 2019-06-12 NOTE — Discharge Instructions (Addendum)
Olivia Robertson had pictures of head, neck, and chest which showed no injuries. Return to ER if any major changes in mental status, fever, lethargy, vomiting, or obvious complaints of pain.

## 2019-06-12 NOTE — ED Provider Notes (Addendum)
MSE was initiated and I personally evaluated the patient and placed orders (if any) at  6:20 AM on June 12, 2019.  The patient appears stable so that the remainder of the MSE may be completed by another provider.  Patient appears agitated but no obvious trauma to head.  Cervical spine immobilized in c-collar.  No deformity or pain on passive movement of extremities.  CT of head and cervical spine ordered.     [place], Jenny Reichmann, MD 06/12/19 [phone]

## 2019-06-12 NOTE — ED Triage Notes (Signed)
Arrives [place] [address] Hospice patient. C/C unwitnessed fall, no obvious injuries. Patient is baseline demented, not oriented.

## 2019-06-12 NOTE — ED Notes (Signed)
Patient was unable to sign discharge paperwork due to mental status. Patient was transported to [place].   Pt stable upon discharge.

## 2019-06-12 NOTE — ED Notes (Signed)
Patient transported to X-ray 

## 2019-06-12 NOTE — ED Notes (Signed)
[facility] has been called for patient transport.  

## 2019-06-14 DIAGNOSIS — F0390 Unspecified dementia without behavioral disturbance: Secondary | ICD-10-CM | POA: Diagnosis not present

## 2019-06-15 DIAGNOSIS — F0390 Unspecified dementia without behavioral disturbance: Secondary | ICD-10-CM | POA: Diagnosis not present

## 2019-06-16 DIAGNOSIS — F0390 Unspecified dementia without behavioral disturbance: Secondary | ICD-10-CM | POA: Diagnosis not present

## 2019-06-26 DIAGNOSIS — E039 Hypothyroidism, unspecified: Secondary | ICD-10-CM | POA: Diagnosis not present

## 2019-06-26 DIAGNOSIS — E782 Mixed hyperlipidemia: Secondary | ICD-10-CM | POA: Diagnosis not present

## 2019-06-26 DIAGNOSIS — M545 Low back pain: Secondary | ICD-10-CM | POA: Diagnosis not present

## 2019-06-26 DIAGNOSIS — R2681 Unsteadiness on feet: Secondary | ICD-10-CM | POA: Diagnosis not present

## 2019-06-26 DIAGNOSIS — E785 Hyperlipidemia, unspecified: Secondary | ICD-10-CM | POA: Diagnosis not present

## 2019-06-26 DIAGNOSIS — R634 Abnormal weight loss: Secondary | ICD-10-CM | POA: Diagnosis not present

## 2019-06-26 DIAGNOSIS — I10 Essential (primary) hypertension: Secondary | ICD-10-CM | POA: Diagnosis not present

## 2019-06-26 DIAGNOSIS — R296 Repeated falls: Secondary | ICD-10-CM | POA: Diagnosis not present

## 2019-06-26 DIAGNOSIS — G459 Transient cerebral ischemic attack, unspecified: Secondary | ICD-10-CM | POA: Diagnosis not present

## 2019-07-24 DIAGNOSIS — R54 Age-related physical debility: Secondary | ICD-10-CM | POA: Diagnosis not present

## 2019-07-24 DIAGNOSIS — F419 Anxiety disorder, unspecified: Secondary | ICD-10-CM | POA: Diagnosis not present

## 2019-07-24 DIAGNOSIS — G459 Transient cerebral ischemic attack, unspecified: Secondary | ICD-10-CM | POA: Diagnosis not present

## 2019-08-20 DIAGNOSIS — Q845 Enlarged and hypertrophic nails: Secondary | ICD-10-CM | POA: Diagnosis not present

## 2019-08-20 DIAGNOSIS — I739 Peripheral vascular disease, unspecified: Secondary | ICD-10-CM | POA: Diagnosis not present

## 2019-08-20 DIAGNOSIS — L603 Nail dystrophy: Secondary | ICD-10-CM | POA: Diagnosis not present

## 2019-09-18 DIAGNOSIS — E782 Mixed hyperlipidemia: Secondary | ICD-10-CM | POA: Diagnosis not present

## 2019-09-18 DIAGNOSIS — Z8673 Personal history of transient ischemic attack (TIA), and cerebral infarction without residual deficits: Secondary | ICD-10-CM | POA: Diagnosis not present

## 2019-09-18 DIAGNOSIS — K5900 Constipation, unspecified: Secondary | ICD-10-CM | POA: Diagnosis not present

## 2019-10-07 DIAGNOSIS — E782 Mixed hyperlipidemia: Secondary | ICD-10-CM | POA: Diagnosis not present

## 2019-10-07 DIAGNOSIS — I10 Essential (primary) hypertension: Secondary | ICD-10-CM | POA: Diagnosis not present

## 2019-10-28 IMAGING — DX DG LUMBAR SPINE COMPLETE 4+V
5 series · 5 of 5 positions shown · non-contrast
Comparison: 07/13/2017

CLINICAL DATA: Unwitnessed fall at [HOSPITAL]

EXAM:
LUMBAR SPINE - COMPLETE 4+ VIEW

[l-spine ap]
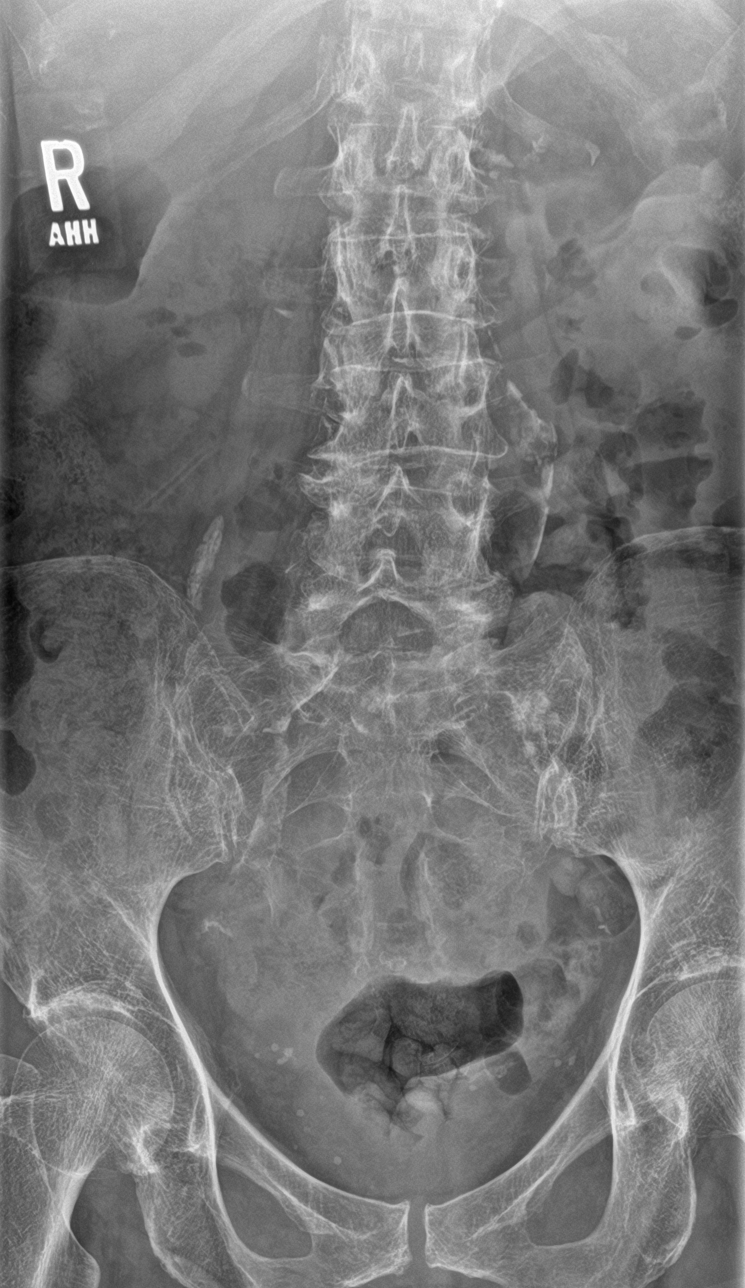

[l-spine obl (1 of 2)]
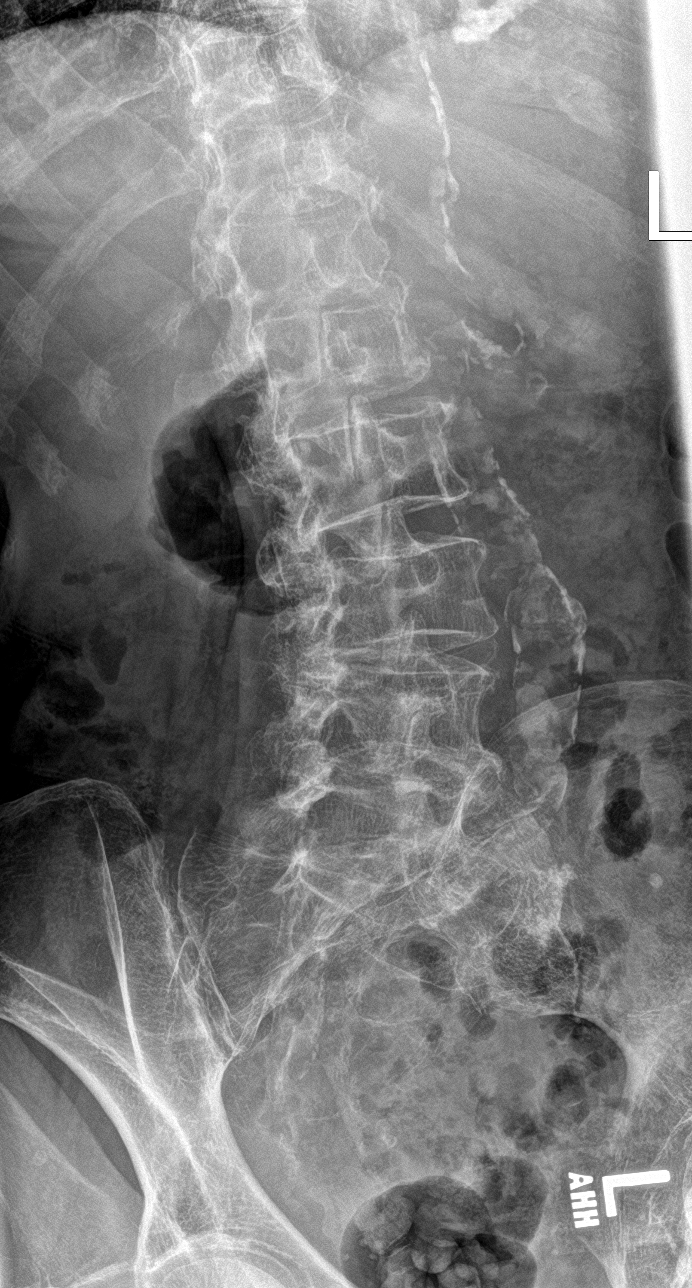

[l-spine obl (2 of 2)]
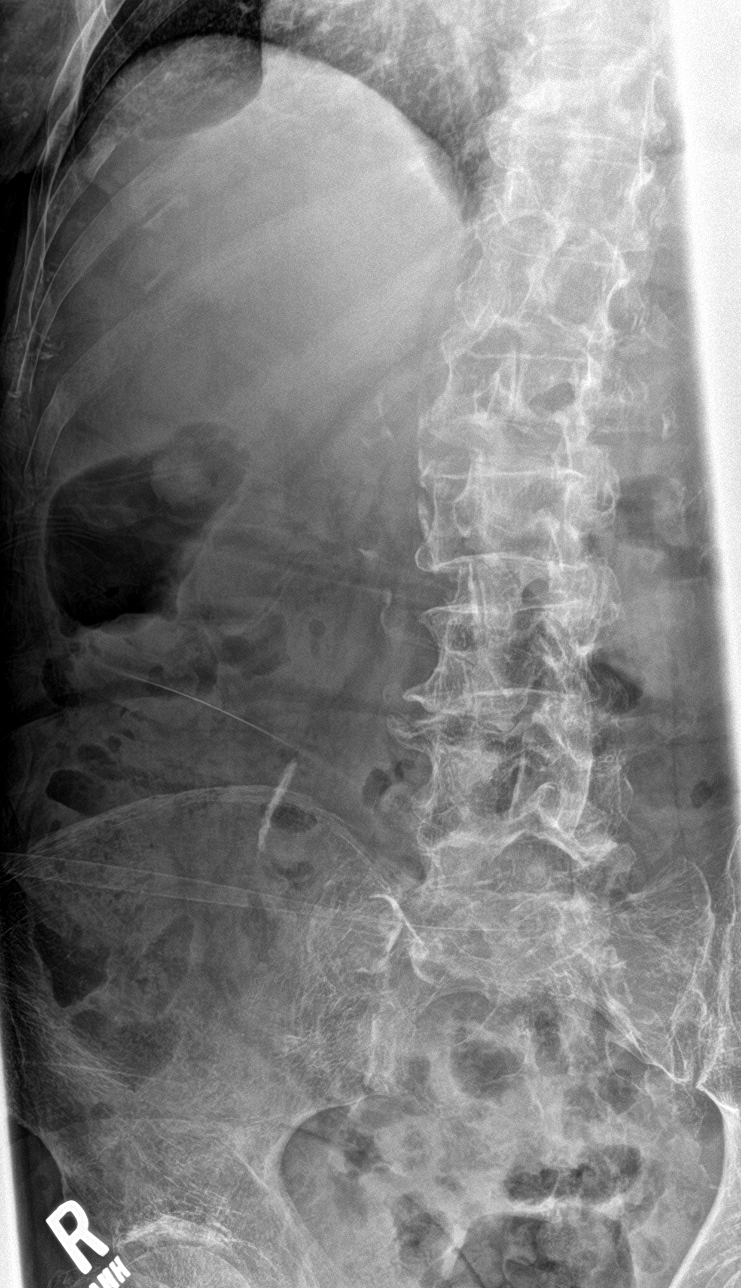

[l-spine lat]
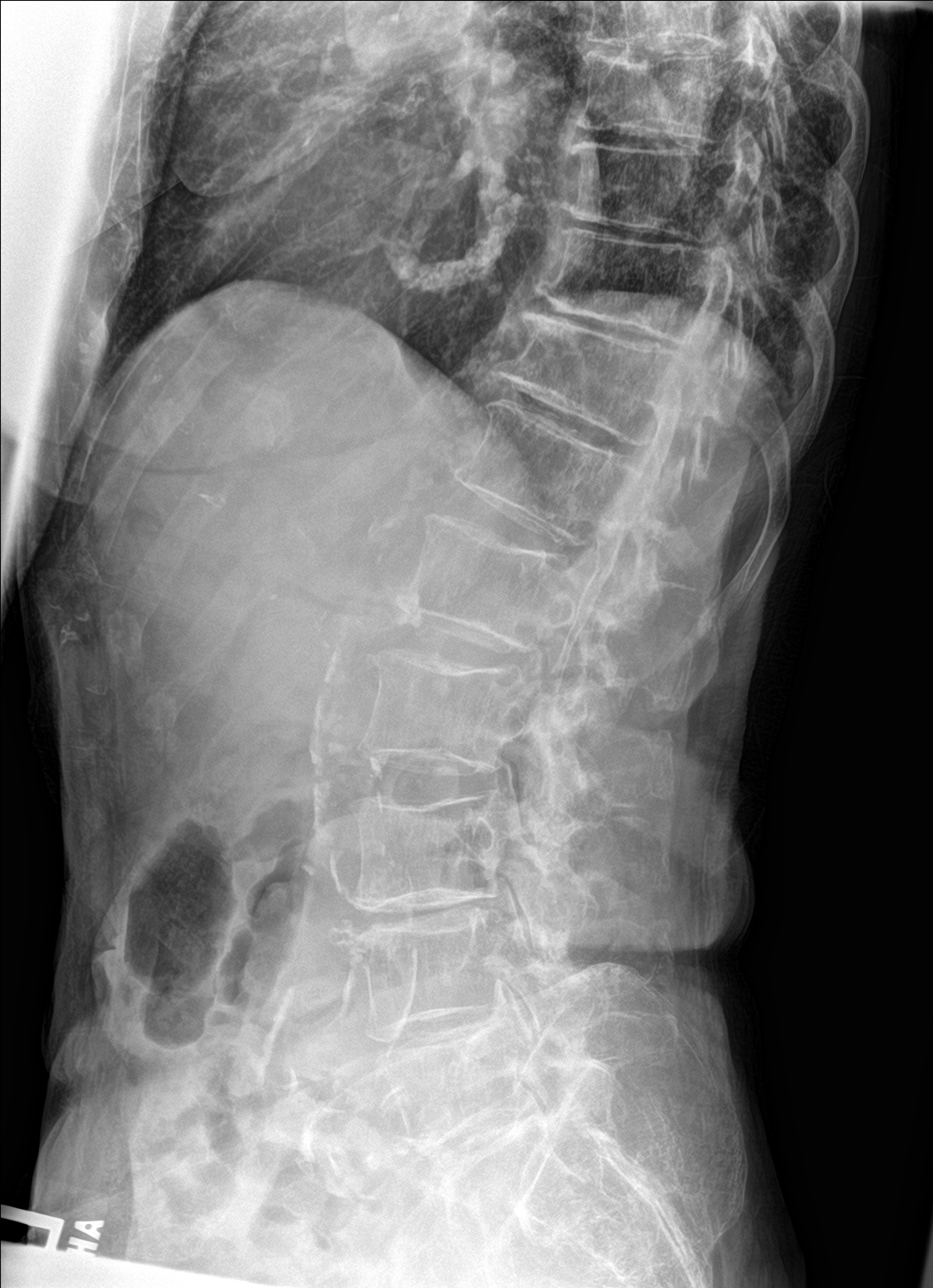

[l-spine spot]
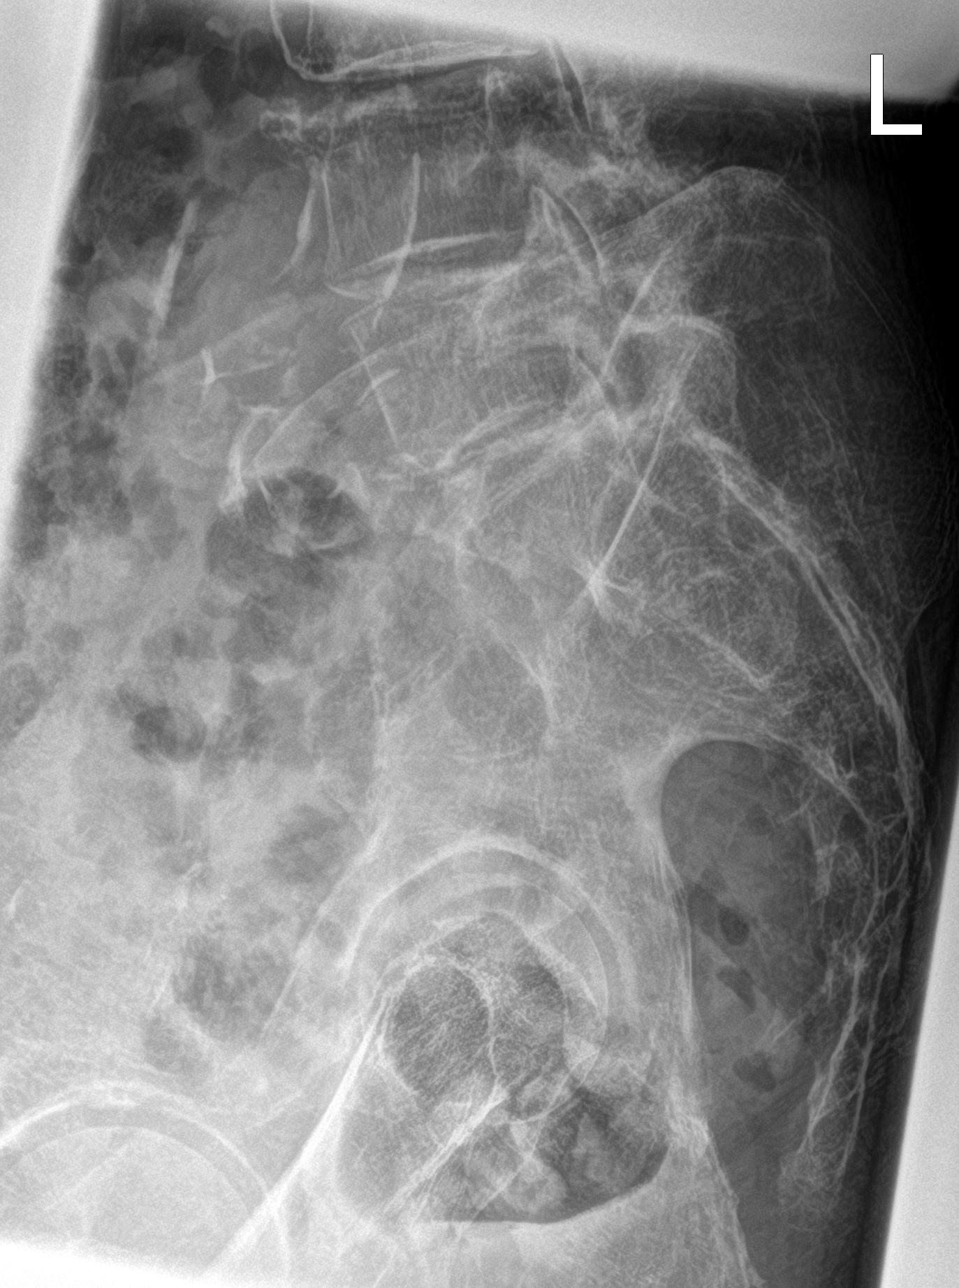

[5 of 5 positions shown; findings below may reference images not displayed]

FINDINGS: Diffuse osseous demineralization.

Five non-rib-bearing lumbar vertebra.

Facet degenerative changes at multiple levels in the lower lumbar
spine.

Disc space narrowing at L3-L4, L4-L5 and L5-S1 as well as at the
thoracolumbar junction.

Vertebral body heights maintained without fracture or subluxation.

No definite bone destruction or spondylolysis.

SI joints preserved.

Atherosclerotic calcifications aorta and iliac arteries with
scattered BILATERAL pelvic phleboliths.
IMPRESSION: Degenerative disc and facet disease changes of the lumbar spine.

No acute abnormalities.

## 2019-10-28 IMAGING — DX DG THORACIC SPINE 2V
2 series · 2 of 2 positions shown · non-contrast
Comparison: Radiographs July 13, 2017.

CLINICAL DATA: Thoracic spine pain after unwitnessed fall.

EXAM:
THORACIC SPINE 2 VIEWS

[t-spine ap]
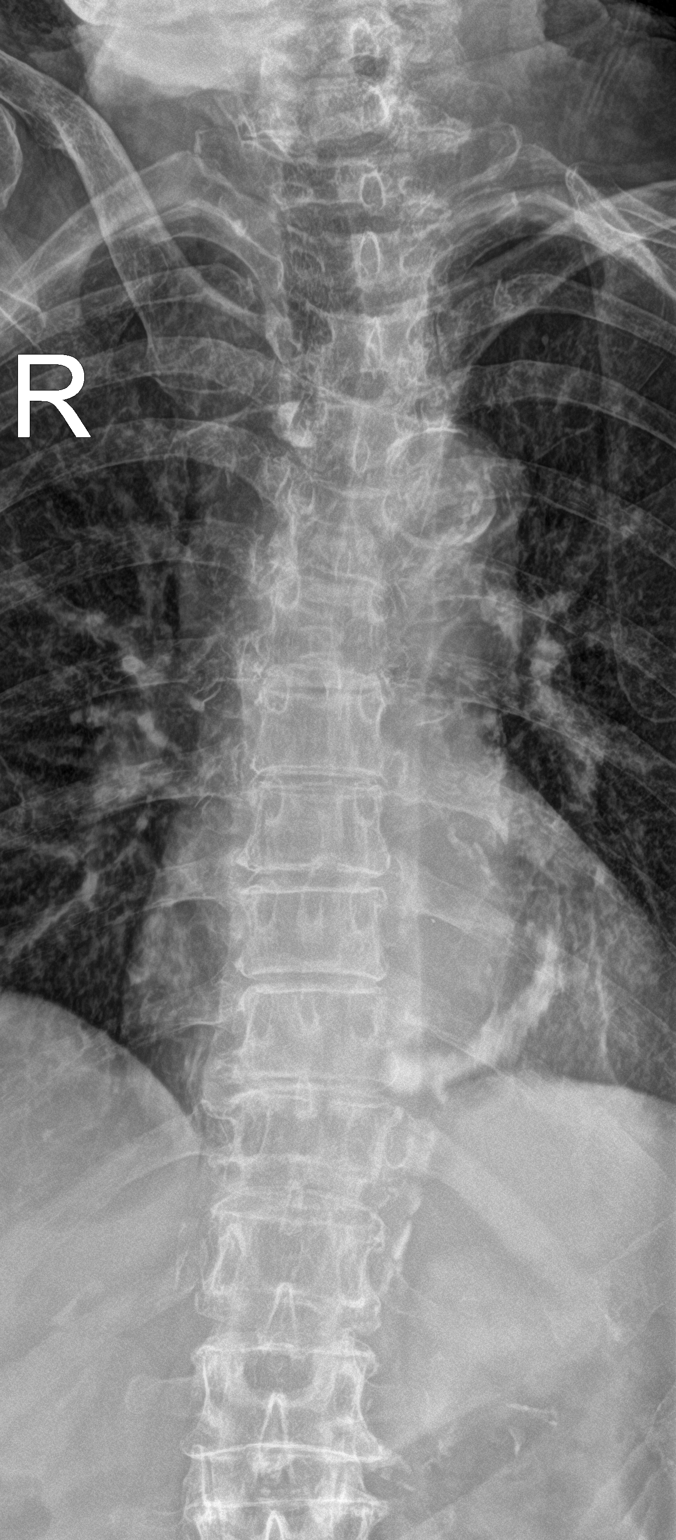

[t-spine lat]
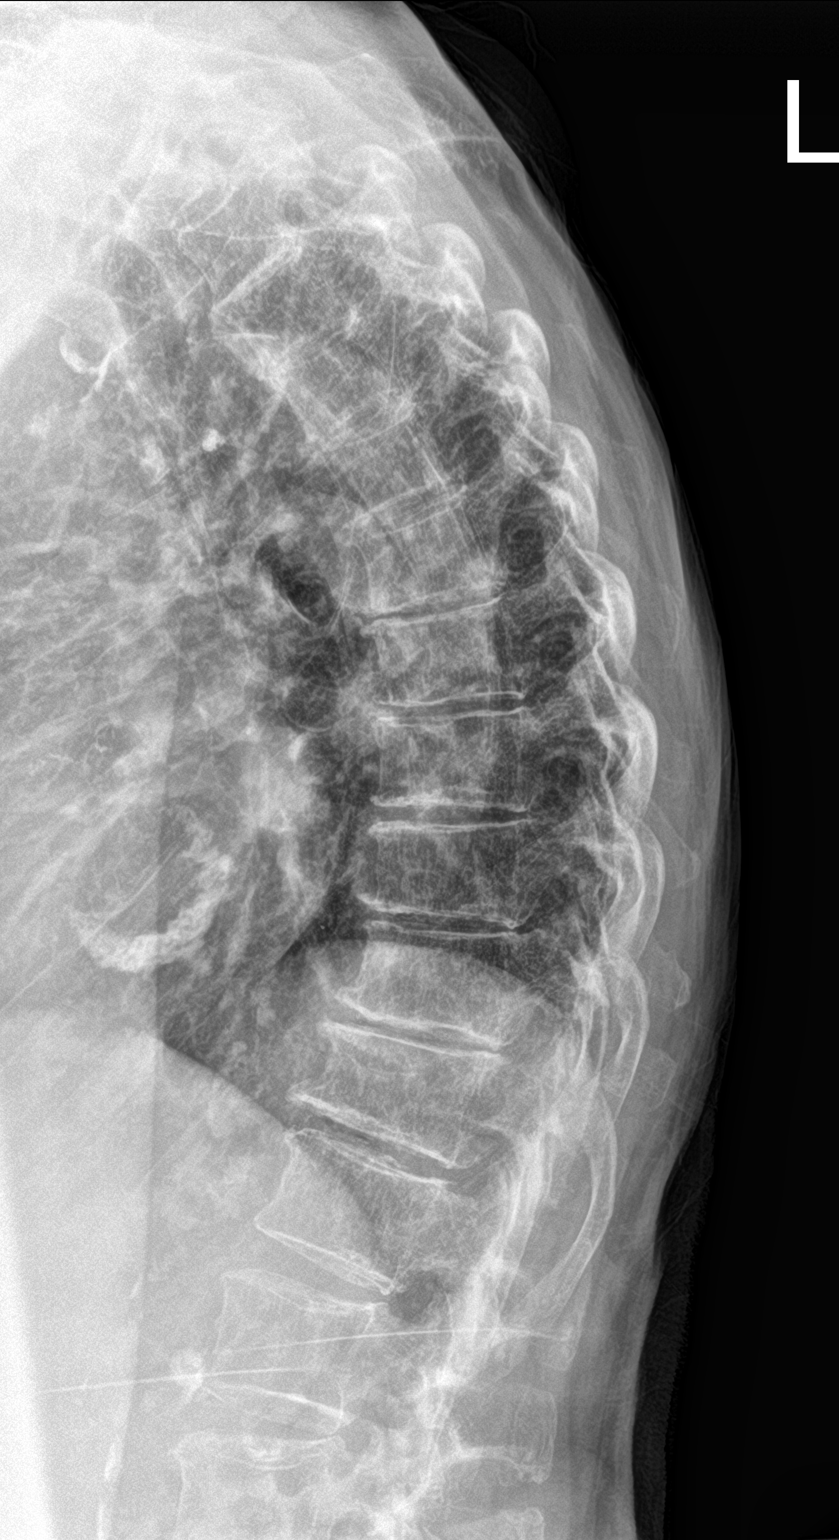

[2 of 2 positions shown; findings below may reference images not displayed]

FINDINGS: No fracture or spondylolisthesis is noted. Mild multilevel
degenerative disc disease is noted in the thoracic spine. Mild
S-shaped scoliosis of thoracic spine is noted.
IMPRESSION: Mild multilevel degenerative disc disease. No acute abnormality seen
in the thoracic spine.

## 2019-11-07 ENCOUNTER — Emergency Department (HOSPITAL_COMMUNITY)

## 2019-11-07 ENCOUNTER — Other Ambulatory Visit: Payer: Self-pay

## 2019-11-07 ENCOUNTER — Emergency Department (HOSPITAL_COMMUNITY)
Admission: EM | Admit: 2019-11-07 | Discharge: 2019-11-07 | Disposition: A | Discharge status: Home / Self Care | Attending: Emergency Medicine | Admitting: Emergency Medicine

## 2019-11-07 ENCOUNTER — Encounter (HOSPITAL_COMMUNITY): Payer: Self-pay

## 2019-11-07 DIAGNOSIS — S199XXA Unspecified injury of neck, initial encounter: Secondary | ICD-10-CM | POA: Diagnosis not present

## 2019-11-07 DIAGNOSIS — Z7401 Bed confinement status: Secondary | ICD-10-CM | POA: Diagnosis not present

## 2019-11-07 DIAGNOSIS — W1830XA Fall on same level, unspecified, initial encounter: Secondary | ICD-10-CM | POA: Diagnosis not present

## 2019-11-07 DIAGNOSIS — S12301A Unspecified nondisplaced fracture of fourth cervical vertebra, initial encounter for closed fracture: Secondary | ICD-10-CM | POA: Diagnosis not present

## 2019-11-07 DIAGNOSIS — M2550 Pain in unspecified joint: Secondary | ICD-10-CM | POA: Diagnosis not present

## 2019-11-07 DIAGNOSIS — Z87891 Personal history of nicotine dependence: Secondary | ICD-10-CM | POA: Insufficient documentation

## 2019-11-07 DIAGNOSIS — R0902 Hypoxemia: Secondary | ICD-10-CM | POA: Diagnosis not present

## 2019-11-07 DIAGNOSIS — I10 Essential (primary) hypertension: Secondary | ICD-10-CM | POA: Diagnosis not present

## 2019-11-07 DIAGNOSIS — Y999 Unspecified external cause status: Secondary | ICD-10-CM | POA: Diagnosis not present

## 2019-11-07 DIAGNOSIS — Z79899 Other long term (current) drug therapy: Secondary | ICD-10-CM | POA: Insufficient documentation

## 2019-11-07 DIAGNOSIS — Y92122 Bedroom in nursing home as the place of occurrence of the external cause: Secondary | ICD-10-CM | POA: Diagnosis not present

## 2019-11-07 DIAGNOSIS — R4182 Altered mental status, unspecified: Secondary | ICD-10-CM | POA: Insufficient documentation

## 2019-11-07 DIAGNOSIS — S0003XA Contusion of scalp, initial encounter: Secondary | ICD-10-CM | POA: Diagnosis not present

## 2019-11-07 DIAGNOSIS — W19XXXA Unspecified fall, initial encounter: Secondary | ICD-10-CM

## 2019-11-07 DIAGNOSIS — Y939 Activity, unspecified: Secondary | ICD-10-CM | POA: Diagnosis not present

## 2019-11-07 DIAGNOSIS — S0081XA Abrasion of other part of head, initial encounter: Secondary | ICD-10-CM | POA: Diagnosis not present

## 2019-11-07 DIAGNOSIS — Z7982 Long term (current) use of aspirin: Secondary | ICD-10-CM | POA: Insufficient documentation

## 2019-11-07 DIAGNOSIS — R5381 Other malaise: Secondary | ICD-10-CM | POA: Diagnosis not present

## 2019-11-07 MED ORDER — MORPHINE SULFATE (PF) 2 MG/ML IV SOLN
2.0000 mg | Freq: Once | INTRAVENOUS | Status: AC
  Administered 2019-11-07: 19:00:00 2 mg via INTRAMUSCULAR
  Filled 2019-11-07: qty 1
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
## 2019-11-07 NOTE — ED Triage Notes (Signed)
Pt bib [facility] after a fall while being transferred from wheelchair to bed. Witnessed fall, no loc, no blood thinners. Per EMS, [facility] Robertson staff unaware of whether pt has hx of dementia. Pt able to follow commands, however, only speaks with yes/no answers. Pt presents with head lac to forehead.

## 2019-11-07 NOTE — ED Notes (Signed)
Patient verbalizes understanding of discharge instructions. Opportunity for questioning and answers were provided. Armband removed by staff, pt discharged from ED via stretcher with PTAR to [place].

## 2019-11-07 NOTE — Discharge Instructions (Addendum)
Clean abrasion for twice daily with soap and water.  Tylenol for pain follow-up with Dr. Christella Noa in 2 weeks .  Ware the aspen collar as much as possible

## 2019-11-07 NOTE — ED Notes (Signed)
Report given to [facility] RN. 

## 2019-11-17 DIAGNOSIS — I739 Peripheral vascular disease, unspecified: Secondary | ICD-10-CM | POA: Diagnosis not present

## 2019-11-17 DIAGNOSIS — B351 Tinea unguium: Secondary | ICD-10-CM | POA: Diagnosis not present

## 2019-11-17 DIAGNOSIS — Q845 Enlarged and hypertrophic nails: Secondary | ICD-10-CM | POA: Diagnosis not present

## 2019-12-18 DEATH — deceased
